# Patient Record
Sex: Female | Born: 1940
Health system: Southern US, Community
[De-identification: ages and names within clinical notes are randomized; demographics above are authoritative.]

## PROBLEM LIST (undated history)

## (undated) DIAGNOSIS — T7840XA Allergy, unspecified, initial encounter: Secondary | ICD-10-CM

## (undated) DIAGNOSIS — M1990 Unspecified osteoarthritis, unspecified site: Secondary | ICD-10-CM

## (undated) DIAGNOSIS — I499 Cardiac arrhythmia, unspecified: Secondary | ICD-10-CM

## (undated) DIAGNOSIS — H532 Diplopia: Secondary | ICD-10-CM

## (undated) DIAGNOSIS — N200 Calculus of kidney: Secondary | ICD-10-CM

## (undated) DIAGNOSIS — H269 Unspecified cataract: Secondary | ICD-10-CM

## (undated) DIAGNOSIS — I10 Essential (primary) hypertension: Secondary | ICD-10-CM

## (undated) DIAGNOSIS — N390 Urinary tract infection, site not specified: Secondary | ICD-10-CM

## (undated) DIAGNOSIS — R7989 Other specified abnormal findings of blood chemistry: Secondary | ICD-10-CM

## (undated) DIAGNOSIS — Z87442 Personal history of urinary calculi: Secondary | ICD-10-CM

## (undated) DIAGNOSIS — I4891 Unspecified atrial fibrillation: Secondary | ICD-10-CM

## (undated) DIAGNOSIS — E039 Hypothyroidism, unspecified: Secondary | ICD-10-CM

## (undated) DIAGNOSIS — E785 Hyperlipidemia, unspecified: Secondary | ICD-10-CM

## (undated) DIAGNOSIS — D72819 Decreased white blood cell count, unspecified: Secondary | ICD-10-CM

## (undated) DIAGNOSIS — R945 Abnormal results of liver function studies: Secondary | ICD-10-CM

## (undated) HISTORY — DX: Unspecified osteoarthritis, unspecified site: M19.90

## (undated) HISTORY — DX: Calculus of kidney: N20.0

## (undated) HISTORY — DX: Essential (primary) hypertension: I10

## (undated) HISTORY — DX: Other specified abnormal findings of blood chemistry: R79.89

## (undated) HISTORY — DX: Urinary tract infection, site not specified: N39.0

## (undated) HISTORY — DX: Abnormal results of liver function studies: R94.5

## (undated) HISTORY — DX: Hypothyroidism, unspecified: E03.9

## (undated) HISTORY — DX: Unspecified cataract: H26.9

## (undated) HISTORY — DX: Hyperlipidemia, unspecified: E78.5

## (undated) HISTORY — PX: BONE MARROW BIOPSY: SHX199

## (undated) HISTORY — PX: TONSILLECTOMY: SHX5217

## (undated) HISTORY — PX: LIVER BIOPSY: SHX301

## (undated) HISTORY — DX: Decreased white blood cell count, unspecified: D72.819

## (undated) HISTORY — DX: Cardiac arrhythmia, unspecified: I49.9

## (undated) HISTORY — PX: OTHER SURGICAL HISTORY: SHX169

## (undated) HISTORY — DX: Diplopia: H53.2

## (undated) HISTORY — DX: Allergy, unspecified, initial encounter: T78.40XA

## (undated) HISTORY — PX: CATARACT EXTRACTION, BILATERAL: SHX1313

## (undated) HISTORY — PX: TUBAL LIGATION: SHX77

---

## 1998-07-10 ENCOUNTER — Other Ambulatory Visit: Admission: RE | Admit: 1998-07-10 | Discharge: 1998-07-10 | Payer: Self-pay | Admitting: Obstetrics and Gynecology

## 1999-08-06 ENCOUNTER — Other Ambulatory Visit: Admission: RE | Admit: 1999-08-06 | Discharge: 1999-08-06 | Payer: Self-pay | Admitting: Obstetrics and Gynecology

## 1999-12-28 ENCOUNTER — Encounter: Admission: RE | Admit: 1999-12-28 | Discharge: 1999-12-28 | Payer: Self-pay | Admitting: Obstetrics and Gynecology

## 1999-12-28 ENCOUNTER — Encounter: Payer: Self-pay | Admitting: Obstetrics and Gynecology

## 2000-07-29 ENCOUNTER — Encounter (INDEPENDENT_AMBULATORY_CARE_PROVIDER_SITE_OTHER): Payer: Self-pay | Admitting: Specialist

## 2000-07-29 ENCOUNTER — Other Ambulatory Visit: Admission: RE | Admit: 2000-07-29 | Discharge: 2000-07-29 | Payer: Self-pay | Admitting: Obstetrics and Gynecology

## 2000-08-11 ENCOUNTER — Other Ambulatory Visit: Admission: RE | Admit: 2000-08-11 | Discharge: 2000-08-11 | Payer: Self-pay | Admitting: Obstetrics and Gynecology

## 2000-09-30 DIAGNOSIS — H532 Diplopia: Secondary | ICD-10-CM

## 2000-09-30 HISTORY — DX: Diplopia: H53.2

## 2001-01-02 ENCOUNTER — Encounter: Payer: Self-pay | Admitting: Obstetrics and Gynecology

## 2001-01-02 ENCOUNTER — Encounter: Admission: RE | Admit: 2001-01-02 | Discharge: 2001-01-02 | Payer: Self-pay | Admitting: Obstetrics and Gynecology

## 2001-02-09 ENCOUNTER — Other Ambulatory Visit: Admission: RE | Admit: 2001-02-09 | Discharge: 2001-02-09 | Payer: Self-pay | Admitting: Obstetrics and Gynecology

## 2001-09-11 ENCOUNTER — Other Ambulatory Visit: Admission: RE | Admit: 2001-09-11 | Discharge: 2001-09-11 | Payer: Self-pay | Admitting: Internal Medicine

## 2001-09-11 ENCOUNTER — Encounter: Payer: Self-pay | Admitting: Internal Medicine

## 2002-01-05 ENCOUNTER — Encounter: Payer: Self-pay | Admitting: Internal Medicine

## 2002-01-05 ENCOUNTER — Encounter: Admission: RE | Admit: 2002-01-05 | Discharge: 2002-01-05 | Payer: Self-pay | Admitting: Internal Medicine

## 2002-07-20 ENCOUNTER — Encounter (INDEPENDENT_AMBULATORY_CARE_PROVIDER_SITE_OTHER): Payer: Self-pay | Admitting: Specialist

## 2002-07-20 ENCOUNTER — Ambulatory Visit: Admission: RE | Admit: 2002-07-20 | Discharge: 2002-07-20 | Payer: Self-pay | Admitting: Oncology

## 2002-07-26 ENCOUNTER — Encounter: Payer: Self-pay | Admitting: Internal Medicine

## 2002-10-29 ENCOUNTER — Encounter: Payer: Self-pay | Admitting: Internal Medicine

## 2002-11-08 ENCOUNTER — Other Ambulatory Visit: Admission: RE | Admit: 2002-11-08 | Discharge: 2002-11-08 | Payer: Self-pay | Admitting: Internal Medicine

## 2003-01-11 ENCOUNTER — Encounter: Payer: Self-pay | Admitting: Internal Medicine

## 2003-01-11 ENCOUNTER — Encounter: Admission: RE | Admit: 2003-01-11 | Discharge: 2003-01-11 | Payer: Self-pay | Admitting: Internal Medicine

## 2003-10-01 HISTORY — PX: OTHER SURGICAL HISTORY: SHX169

## 2004-01-13 ENCOUNTER — Encounter: Admission: RE | Admit: 2004-01-13 | Discharge: 2004-01-13 | Payer: Self-pay | Admitting: Internal Medicine

## 2004-01-18 ENCOUNTER — Encounter: Admission: RE | Admit: 2004-01-18 | Discharge: 2004-01-18 | Payer: Self-pay | Admitting: Internal Medicine

## 2005-01-25 ENCOUNTER — Encounter: Admission: RE | Admit: 2005-01-25 | Discharge: 2005-01-25 | Payer: Self-pay | Admitting: Internal Medicine

## 2005-05-13 ENCOUNTER — Ambulatory Visit: Payer: Self-pay | Admitting: Internal Medicine

## 2005-05-20 ENCOUNTER — Ambulatory Visit: Payer: Self-pay | Admitting: Internal Medicine

## 2005-05-20 ENCOUNTER — Other Ambulatory Visit: Admission: RE | Admit: 2005-05-20 | Discharge: 2005-05-20 | Payer: Self-pay | Admitting: Internal Medicine

## 2005-06-17 ENCOUNTER — Ambulatory Visit: Payer: Self-pay | Admitting: Internal Medicine

## 2005-06-25 ENCOUNTER — Encounter: Payer: Self-pay | Admitting: Internal Medicine

## 2005-08-06 ENCOUNTER — Ambulatory Visit: Payer: Self-pay | Admitting: Internal Medicine

## 2005-11-06 ENCOUNTER — Ambulatory Visit: Payer: Self-pay | Admitting: Internal Medicine

## 2006-01-27 ENCOUNTER — Encounter: Admission: RE | Admit: 2006-01-27 | Discharge: 2006-01-27 | Payer: Self-pay | Admitting: Internal Medicine

## 2006-06-23 ENCOUNTER — Ambulatory Visit: Payer: Self-pay | Admitting: Internal Medicine

## 2006-06-30 ENCOUNTER — Ambulatory Visit: Payer: Self-pay | Admitting: Internal Medicine

## 2006-09-30 LAB — CONVERTED CEMR LAB: Pap Smear: NORMAL

## 2007-01-30 ENCOUNTER — Encounter: Admission: RE | Admit: 2007-01-30 | Discharge: 2007-01-30 | Payer: Self-pay | Admitting: Internal Medicine

## 2007-06-16 DIAGNOSIS — E039 Hypothyroidism, unspecified: Secondary | ICD-10-CM | POA: Insufficient documentation

## 2007-06-16 DIAGNOSIS — E785 Hyperlipidemia, unspecified: Secondary | ICD-10-CM

## 2007-07-14 ENCOUNTER — Ambulatory Visit: Payer: Self-pay | Admitting: Internal Medicine

## 2007-07-14 ENCOUNTER — Encounter: Payer: Self-pay | Admitting: Internal Medicine

## 2007-07-31 ENCOUNTER — Ambulatory Visit: Payer: Self-pay | Admitting: Internal Medicine

## 2007-07-31 LAB — CONVERTED CEMR LAB
Glucose, Urine, Semiquant: 100
Nitrite: NEGATIVE
Protein, U semiquant: NEGATIVE
Urobilinogen, UA: 0.2

## 2007-08-05 LAB — CONVERTED CEMR LAB
ALT: 36 units/L — ABNORMAL HIGH (ref 0–35)
AST: 33 units/L (ref 0–37)
Alkaline Phosphatase: 78 units/L (ref 39–117)
BUN: 16 mg/dL (ref 6–23)
Basophils Relative: 1.6 % — ABNORMAL HIGH (ref 0.0–1.0)
Bilirubin, Direct: 0.1 mg/dL (ref 0.0–0.3)
Chloride: 107 meq/L (ref 96–112)
Creatinine, Ser: 0.6 mg/dL (ref 0.4–1.2)
Direct LDL: 154.3 mg/dL
Eosinophils Absolute: 0.1 10*3/uL (ref 0.0–0.6)
Eosinophils Relative: 3.1 % (ref 0.0–5.0)
HCT: 41.3 % (ref 36.0–46.0)
Hemoglobin: 14.4 g/dL (ref 12.0–15.0)
MCV: 94.9 fL (ref 78.0–100.0)
Potassium: 4.9 meq/L (ref 3.5–5.1)
RBC: 4.36 M/uL (ref 3.87–5.11)
Sodium: 142 meq/L (ref 135–145)
Total Bilirubin: 0.8 mg/dL (ref 0.3–1.2)
Triglycerides: 53 mg/dL (ref 0–149)
VLDL: 11 mg/dL (ref 0–40)

## 2007-08-10 ENCOUNTER — Encounter: Payer: Self-pay | Admitting: Internal Medicine

## 2007-08-10 ENCOUNTER — Other Ambulatory Visit: Admission: RE | Admit: 2007-08-10 | Discharge: 2007-08-10 | Payer: Self-pay | Admitting: Internal Medicine

## 2007-08-10 ENCOUNTER — Ambulatory Visit: Payer: Self-pay | Admitting: Internal Medicine

## 2007-08-10 DIAGNOSIS — M899 Disorder of bone, unspecified: Secondary | ICD-10-CM

## 2007-08-10 DIAGNOSIS — D709 Neutropenia, unspecified: Secondary | ICD-10-CM

## 2007-08-10 DIAGNOSIS — M949 Disorder of cartilage, unspecified: Secondary | ICD-10-CM

## 2007-08-11 ENCOUNTER — Encounter: Payer: Self-pay | Admitting: Internal Medicine

## 2007-09-08 ENCOUNTER — Ambulatory Visit: Payer: Self-pay | Admitting: Internal Medicine

## 2007-09-09 ENCOUNTER — Encounter: Payer: Self-pay | Admitting: Internal Medicine

## 2007-09-10 ENCOUNTER — Encounter: Payer: Self-pay | Admitting: Internal Medicine

## 2007-09-10 LAB — CONVERTED CEMR LAB
CRP, High Sensitivity: 1 — ABNORMAL HIGH
Total Bilirubin: 1.1 mg/dL (ref 0.3–1.2)

## 2007-09-15 ENCOUNTER — Ambulatory Visit: Payer: Self-pay | Admitting: Internal Medicine

## 2007-09-15 DIAGNOSIS — J069 Acute upper respiratory infection, unspecified: Secondary | ICD-10-CM | POA: Insufficient documentation

## 2007-12-15 ENCOUNTER — Ambulatory Visit: Payer: Self-pay | Admitting: Internal Medicine

## 2007-12-28 ENCOUNTER — Telehealth: Payer: Self-pay | Admitting: *Deleted

## 2007-12-29 LAB — CONVERTED CEMR LAB
CRP, High Sensitivity: <1 — ABNORMAL LOW (ref 0.00–5.00)
TSH: 1.67 microintl units/mL (ref 0.35–5.50)

## 2008-02-04 ENCOUNTER — Encounter: Admission: RE | Admit: 2008-02-04 | Discharge: 2008-02-04 | Payer: Self-pay | Admitting: Internal Medicine

## 2008-07-07 ENCOUNTER — Ambulatory Visit: Payer: Self-pay | Admitting: Internal Medicine

## 2008-07-19 ENCOUNTER — Ambulatory Visit: Payer: Self-pay | Admitting: Internal Medicine

## 2008-10-19 ENCOUNTER — Ambulatory Visit: Payer: Self-pay | Admitting: Internal Medicine

## 2008-10-19 LAB — CONVERTED CEMR LAB
AST: 61 units/L — ABNORMAL HIGH (ref 0–37)
Albumin: 3.8 g/dL (ref 3.5–5.2)
Alkaline Phosphatase: 70 units/L (ref 39–117)
BUN: 16 mg/dL (ref 6–23)
Basophils Absolute: 0 10*3/uL (ref 0.0–0.1)
Bilirubin Urine: NEGATIVE
CO2: 31 meq/L (ref 19–32)
Cholesterol: 271 mg/dL (ref 0–200)
Direct LDL: 127 mg/dL
Eosinophils Relative: 2.2 % (ref 0.0–5.0)
GFR calc Af Amer: 108 mL/min
GFR calc non Af Amer: 89 mL/min
Glucose, Urine, Semiquant: NEGATIVE
HDL: 123.5 mg/dL (ref >39.0)
Hemoglobin: 14 g/dL (ref 12.0–15.0)
Ketones, urine, test strip: NEGATIVE
Lymphocytes Relative: 22.1 % (ref 12.0–46.0)
Monocytes Relative: 14.3 % — ABNORMAL HIGH (ref 3.0–12.0)
Sodium: 143 meq/L (ref 135–145)
TSH: 1.32 microintl units/mL (ref 0.35–5.50)
Total Bilirubin: 0.9 mg/dL (ref 0.3–1.2)
Total CHOL/HDL Ratio: 2.2
Triglycerides: 46 mg/dL (ref 0–149)
Urobilinogen, UA: 0.2
VLDL: 9 mg/dL (ref 0–40)
WBC: 3 10*3/uL — ABNORMAL LOW (ref 4.5–10.5)
pH: 5.5

## 2008-10-31 ENCOUNTER — Ambulatory Visit: Payer: Self-pay | Admitting: Internal Medicine

## 2008-10-31 DIAGNOSIS — R945 Abnormal results of liver function studies: Secondary | ICD-10-CM | POA: Insufficient documentation

## 2008-11-28 ENCOUNTER — Ambulatory Visit: Payer: Self-pay | Admitting: Internal Medicine

## 2008-11-28 LAB — CONVERTED CEMR LAB
ALT: 40 units/L — ABNORMAL HIGH (ref 0–35)
Alkaline Phosphatase: 71 units/L (ref 39–117)
Bilirubin, Direct: 0.1 mg/dL (ref 0.0–0.3)

## 2008-12-05 ENCOUNTER — Ambulatory Visit: Payer: Self-pay | Admitting: Internal Medicine

## 2008-12-05 DIAGNOSIS — R079 Chest pain, unspecified: Secondary | ICD-10-CM

## 2008-12-21 ENCOUNTER — Encounter: Payer: Self-pay | Admitting: Internal Medicine

## 2009-02-07 ENCOUNTER — Encounter: Admission: RE | Admit: 2009-02-07 | Discharge: 2009-02-07 | Payer: Self-pay | Admitting: Internal Medicine

## 2009-05-12 ENCOUNTER — Telehealth: Payer: Self-pay | Admitting: *Deleted

## 2009-06-06 ENCOUNTER — Ambulatory Visit: Payer: Self-pay | Admitting: Internal Medicine

## 2009-06-14 LAB — CONVERTED CEMR LAB
ALT: 36 units/L — ABNORMAL HIGH (ref 0–35)
Albumin: 3.8 g/dL (ref 3.5–5.2)
Bilirubin, Direct: 0 mg/dL (ref 0.0–0.3)
Total Bilirubin: 0.8 mg/dL (ref 0.3–1.2)

## 2009-09-07 ENCOUNTER — Ambulatory Visit: Payer: Self-pay | Admitting: Internal Medicine

## 2009-09-07 LAB — CONVERTED CEMR LAB
ALT: 49 units/L — ABNORMAL HIGH (ref 0–35)
Alkaline Phosphatase: 79 units/L (ref 39–117)
Total Bilirubin: 1.1 mg/dL (ref 0.3–1.2)
Total Protein: 7.2 g/dL (ref 6.0–8.3)

## 2009-10-03 ENCOUNTER — Ambulatory Visit: Payer: Self-pay | Admitting: Internal Medicine

## 2009-10-03 DIAGNOSIS — M19049 Primary osteoarthritis, unspecified hand: Secondary | ICD-10-CM | POA: Insufficient documentation

## 2009-10-12 ENCOUNTER — Encounter: Payer: Self-pay | Admitting: Internal Medicine

## 2009-10-31 ENCOUNTER — Encounter: Admission: RE | Admit: 2009-10-31 | Discharge: 2009-10-31 | Payer: Self-pay | Admitting: Rheumatology

## 2009-11-10 ENCOUNTER — Encounter: Payer: Self-pay | Admitting: Internal Medicine

## 2009-11-10 ENCOUNTER — Ambulatory Visit (HOSPITAL_COMMUNITY): Admission: RE | Admit: 2009-11-10 | Discharge: 2009-11-10 | Payer: Self-pay | Admitting: Unknown Physician Specialty

## 2009-11-15 ENCOUNTER — Encounter: Payer: Self-pay | Admitting: Internal Medicine

## 2009-12-26 ENCOUNTER — Telehealth: Payer: Self-pay | Admitting: *Deleted

## 2009-12-28 ENCOUNTER — Ambulatory Visit: Payer: Self-pay | Admitting: Internal Medicine

## 2010-02-01 ENCOUNTER — Ambulatory Visit: Payer: Self-pay | Admitting: Internal Medicine

## 2010-02-19 ENCOUNTER — Encounter: Admission: RE | Admit: 2010-02-19 | Discharge: 2010-02-19 | Payer: Self-pay | Admitting: Internal Medicine

## 2010-02-20 ENCOUNTER — Other Ambulatory Visit: Admission: RE | Admit: 2010-02-20 | Discharge: 2010-02-20 | Payer: Self-pay | Admitting: Internal Medicine

## 2010-02-21 ENCOUNTER — Ambulatory Visit: Payer: Self-pay | Admitting: Internal Medicine

## 2010-02-27 ENCOUNTER — Encounter: Payer: Self-pay | Admitting: *Deleted

## 2010-02-28 LAB — CONVERTED CEMR LAB
AST: 35 units/L (ref 0–37)
Alkaline Phosphatase: 91 units/L (ref 39–117)
BUN: 18 mg/dL (ref 6–23)
Bilirubin, Direct: 0.1 mg/dL (ref 0.0–0.3)
CO2: 32 meq/L (ref 19–32)
Calcium: 9.9 mg/dL (ref 8.4–10.5)
Creatinine, Ser: 0.6 mg/dL (ref 0.4–1.2)
Direct LDL: 165.6 mg/dL
Eosinophils Relative: 3.3 % (ref 0.0–5.0)
Glucose, Bld: 77 mg/dL (ref 70–99)
HCT: 41.3 % (ref 36.0–46.0)
Lymphocytes Relative: 32.2 % (ref 12.0–46.0)
Lymphs Abs: 1 10*3/uL (ref 0.7–4.0)
MCHC: 34.7 g/dL (ref 30.0–36.0)
MCV: 95.4 fL (ref 78.0–100.0)
Monocytes Absolute: 0.3 10*3/uL (ref 0.1–1.0)
Monocytes Relative: 11 % (ref 3.0–12.0)
Neutrophils Relative %: 52.7 % (ref 43.0–77.0)
Pap Smear: NEGATIVE
Potassium: 4.9 meq/L (ref 3.5–5.1)
Sodium: 144 meq/L (ref 135–145)
TSH: 0.69 microintl units/mL (ref 0.35–5.50)
Total Bilirubin: 0.9 mg/dL (ref 0.3–1.2)
Triglycerides: 37 mg/dL (ref 0.0–149.0)
VLDL: 7.4 mg/dL (ref 0.0–40.0)

## 2010-04-10 ENCOUNTER — Encounter: Payer: Self-pay | Admitting: Internal Medicine

## 2010-06-19 ENCOUNTER — Ambulatory Visit: Payer: Self-pay | Admitting: Internal Medicine

## 2010-06-19 LAB — CONVERTED CEMR LAB
AST: 35 units/L (ref 0–37)
Basophils Absolute: 0 10*3/uL (ref 0.0–0.1)
Eosinophils Relative: 1.4 % (ref 0.0–5.0)
Hemoglobin: 14 g/dL (ref 12.0–15.0)
Lymphocytes Relative: 19.7 % (ref 12.0–46.0)
MCV: 95.9 fL (ref 78.0–100.0)
Monocytes Relative: 8.2 % (ref 3.0–12.0)
Neutro Abs: 3.8 10*3/uL (ref 1.4–7.7)
Neutrophils Relative %: 70.2 % (ref 43.0–77.0)
RDW: 14.5 % (ref 11.5–14.6)
Total Bilirubin: 0.8 mg/dL (ref 0.3–1.2)

## 2010-06-25 ENCOUNTER — Telehealth: Payer: Self-pay | Admitting: *Deleted

## 2010-10-30 NOTE — Letter (Signed)
Summary: [facility]   Imported By: Maryln Gottron 04/19/2010 13:22:35  _____________________________________________________________________  External Attachment:    Type:   Image     Comment:   External Document

## 2010-10-30 NOTE — Assessment & Plan Note (Signed)
Summary: twinrx vaccine/ssc   Nurse Visit   Allergies: 1)  ! Pcn  Immunizations Administered:  TwinRix # 1:    Vaccine Type: TwinRix    Site: left deltoid    Mfr: GlaxoSmithKline    Dose: 1.0 ml    Route: IM    Given by: Romualdo Bolk, CMA (AAMA)    Exp. Date: 12/09/2011    Lot #: [id number]  Orders Added: 1)  TwinRix 1ml ( Hep A&B Adult dose) [90636] 2)  Admin 1st Vaccine [90471]

## 2010-10-30 NOTE — Assessment & Plan Note (Signed)
Summary: follow up on labs/ssc   Vital Signs:  Patient profile:   71 year old female Menstrual status:  postmenopausal Height:      65.25 inches Weight:      144 pounds BMI:     23.87 Pulse rate:   66 / minute BP sitting:   110 / 60  (right arm) Cuff size:   regular  Vitals Entered By: Romualdo Bolk, CMA (AAMA) (October 03, 2009 2:11 PM) CC: Follow-up visit on labs Menarche (age onset years): 15    Menstrual Status postmenopausal   History of Present Illness: Crystal Odom comes  in today with husband for follow up of her abnormal lab test.  She feels quite well  . has had some le  joint pain felt related to exercise and  ? oa  getting older but is physically active. She does have some stiffness and what she calls  osteoarthritis signs in her hands  but no unusual rashes weight loss fever Gi or Gu signs.     She related  strong family hx of rhematic disease incluing autoimmune hepatitis scleroderma and ra and thyroid disease.     She has not had any of these dx. She was evaluated in the past for leukopenia that dipped to the 2.9 range  that included a bone marrow  .  Cause that was felt to be idopathic by hematology . Has done well since then and no unususal infection of didsease.  Bone density to review.  no fractures .     Preventive Screening-Counseling & Management  Alcohol-Tobacco     Alcohol drinks/day: 1     Alcohol type: wine     Smoking Status: never  Caffeine-Diet-Exercise     Caffeine use/day: 3     Does Patient Exercise: yes     Type of exercise: walking, cardio and strength and yoga     Times/week: 7  Current Medications (verified): 1)  Calcium 500/d 500-125 Mg-Unit  Tabs (Calcium Carbonate-Vitamin D) .Marland Kitchen.. 1 By Mouth Two Times A Day 2)  Centrum Silver   Tabs (Multiple Vitamins-Minerals) .... 3 Tabs A Week 3)  Levoxyl 88 Mcg  Tabs (Levothyroxine Sodium) .Marland Kitchen.. 1 By Mouth Once Daily 4)  Fish Oil   Oil (Fish Oil) .... Once Daily 5)  Aspirin 81 Mg   Tabs (Aspirin) .... 3 A Week  Allergies (verified): 1)  ! Pcn  Past History:  Past medical, surgical, family and social histories (including risk factors) reviewed, and no changes noted (except as noted below).  Past Medical History: Kidney Stones UTI Hyperlipidemia Hypothyroidism Left knee arthritis  ortho  Leukopenia   with neg hemevaluation   CONSULTANTS  [facility]     Past Surgical History: Reviewed history from 10/31/2008 and no changes required. Breast Bx Kidney Stone Removal Tonsillectomy left arthroscopic  2005     Past History:  Care Management: Orthopedics: Thurston [facility] Dermatology: Mayford Knife  Family History: Reviewed history from 10/31/2008 and no changes required. Family History of Arthritis  Maternal GM  ?RA   Family History High cholesterol Family History Hypertension Family History Osteoporosis Family History of Prostate CA 1st degree relative <50 Family History of Stroke M 1st degree relative <50 Family History Thyroid disease Family History of Cardiovascular disorder  Father: died at 49 HT  SCCA in ear and major.  renal failure  Mother:  died at 65 Heart disease   OA  Siblings:   bro and sister ?   Sclerodema raynauds  ?  autoimmune   joint  problems   Social History: Reviewed history from 12/05/2008 and no changes required. Occupation: retired 2008 sept  Married Never Smoked  Alcohol use-yes  5/7 nigths  1 glass wine  Drug use-no Regular exercise-yes HH of 2   no pets   Caffeine use/day:  3  Review of Systems  The patient denies anorexia, fever, weight loss, weight gain, vision loss, decreased hearing, hoarseness, chest pain, syncope, dyspnea on exertion, peripheral edema, prolonged cough, headaches, hemoptysis, abdominal pain, melena, hematochezia, severe indigestion/heartburn, hematuria, incontinence, muscle weakness, suspicious skin lesions, transient blindness, difficulty walking, depression, unusual weight change, abnormal  bleeding, enlarged lymph nodes, angioedema, and breast masses.    Physical Exam  General:  Well-developed,well-nourished,in no acute distress; alert,appropriate and cooperative throughout examination Head:  normocephalic and atraumatic.   Eyes:  vision grossly intact, pupils equal, and pupils round.   Ears:  R ear normal, L ear normal, and no external deformities.   Neck:  No deformities, masses, or tenderness noted. Lungs:  Normal respiratory effort, chest expands symmetrically. Lungs are clear to auscultation, no crackles or wheezes.no dullness.   Heart:  Normal rate and regular rhythm. S1 and S2 normal without gallop, murmur, click, rub or other extra sounds. Abdomen:  Bowel sounds positive,abdomen soft and non-tender without masses, organomegaly or   noted. Msk:  no joint tenderness, no joint warmth, and no redness over joints.  mild mcp pip changes hands no acute joint swelling Pulses:  nl cap refill Extremities:  no clubbing cyanosis or edema  Neurologic:  alert & oriented X3, strength normal in all extremities, and gait normal.   Skin:  turgor normal, color normal, no ecchymoses, no petechiae, and no purpura.   Cervical Nodes:  No lymphadenopathy noted Psych:  Oriented X3, normally interactive, good eye contact, not anxious appearing, and not depressed appearing.   Additional Exam:  see labs    Impression & Recommendations:  Problem # 1:  LIVER FUNCTION TESTS, ABNORMAL (ICD-794.8)  low grade  waxing and waning over prolonged time with hx of nl [place]   but pos ana  currently . disc option of gi consult but will have her see rheum first.    Orders: Rheumatology Referral (Rheumatology)  Problem # 2:  UNSPECIFIED ARTHOPATHY, HAND (ICD-716.94)  felt to be  degenerative and certainly not severe but  with above   and fam hx of rhem disease  and hx of leukopenia unspec.  will get rheum consults   Orders: Rheumatology Referral (Rheumatology)  Problem # 3:  NEUTROPENIA UNSPECIFIED  (ICD-288.00) No symptom  chronic  not changing  prev eval by heme.  Orders: Rheumatology Referral (Rheumatology)  Problem # 4:  OSTEOPENIA (ICD-733.90) frax score    lower risk  will follow and doe weight bearing exercise  ca vit d etc and repeat in 2 years .  Her updated medication list for this problem includes:    Calcium 500/d 500-125 Mg-unit Tabs (Calcium carbonate-vitamin d) .Marland Kitchen... 1 by mouth two times a day  Problem # 5:  family history of autoimmune disease  Complete Medication List: 1)  Calcium 500/d 500-125 Mg-unit Tabs (Calcium carbonate-vitamin d) .Marland Kitchen.. 1 by mouth two times a day 2)  Centrum Silver Tabs (Multiple vitamins-minerals) .... 3 tabs a week 3)  Levoxyl 88 Mcg Tabs (Levothyroxine sodium) .Marland Kitchen.. 1 by mouth once daily 4)  Fish Oil Oil (Fish oil) .... Once daily 5)  Aspirin 81 Mg Tabs (Aspirin) .... 3 a week  Patient Instructions:  1)  Someone will contact  you about the rheumatology referral.  2)  then  will plan   preventive yearly visit after that .  3)  your frax score is  1.7 % risk of hip fracture and  10 % for overall osteoporotic fracture  for the next 10 years.  4)  weight bearing exercise and adequate calcium and vitamin D recomended. will review paper record and get heme consult scanned into record.

## 2010-10-30 NOTE — Assessment & Plan Note (Signed)
Summary: twinrx/ssc   Nurse Visit   Allergies: 1)  ! Pcn  Immunizations Administered:  TwinRix # 2:    Vaccine Type: TwinRix    Site: left deltoid    Mfr: GlaxoSmithKline    Dose: 1.0 ml    Route: IM    Given by: Romualdo Bolk, CMA (AAMA)    Exp. Date: 12/09/2011    Lot #: [id number]  Orders Added: 1)  TwinRix 1ml ( Hep A&B Adult dose) [90636] 2)  Admin 1st Vaccine [90471]

## 2010-10-30 NOTE — Letter (Signed)
[facility]   Imported By: Lennie Odor 03/22/2010 10:16:48  _____________________________________________________________________  External Attachment:    Type:   Image     Comment:   External Document

## 2010-10-30 NOTE — Assessment & Plan Note (Signed)
Summary: emp--will fast//ccm   Vital Signs:  Patient profile:   70 year old female Menstrual status:  postmenopausal Height:      65.5 inches Weight:      141 pounds Pulse rate:   60 / minute BP sitting:   120 / 80  (right arm)  Vitals Entered By: Romualdo Bolk, CMA (AAMA) (Feb 21, 2010 10:06 AM) CC: Annual Visit for Disease Management, Discuss doing a pap, Pt is fasting.- Pt needs a rx for Vivotif Berna and Malarone 250mg . Pt is going to [place]. She is going to need 17 tabs of the Malarone.   History of Present Illness: Crystal Odom comes in for yearly visit . Since last visit she has seen Dr Dareen Piano adn under evaluatino for rheumatic disease  poss autoimmune liver disease .   Had elevated smooth muscle antibody  and thus and a liver bx that apparently  was normal.  Now following to see Dr Dareen Piano in .July .  Actually feels pretty well and no infections or new joint problems bleeding .  THyroid taking meds  witour ptrblem ,  Last pap  3 years ago  and normal . No gyne signs   To travel on a mission   trip with group to [place] and needs antimalarial prophylaxis. .  Preventive Care Screening  Pap Smear:    Date:  09/30/2006    Results:  normal   Prior Values:    Mammogram:  ASSESSMENT: Negative - BI-RADS 1^MM DIGITAL SCREENING (02/19/2010)    Last Tetanus Booster:  Historical (10/31/2002)    Last Flu Shot:  Fluvax 3+ (07/07/2008)    Last Pneumovax:  Pneumovax (07/19/2008)   Preventive Screening-Counseling & Management  Alcohol-Tobacco     Alcohol drinks/day: 1     Alcohol type: wine     Smoking Status: never  Caffeine-Diet-Exercise     Caffeine use/day: 3     Does Patient Exercise: yes     Type of exercise: walking, cardio and strength and yoga     Times/week: 7  Hep-HIV-STD-Contraception     Dental Visit-last 6 months yes     Sun Exposure-Excessive: no  Safety-Violence-Falls     Seat Belt Use: yes     Firearms in the Home: no firearms in the  home     Smoke Detectors: yes  EKG  Procedure date:  10/31/2008  Findings:      No Acute Changes- SR NS St Changes  Current Medications (verified): 1)  Calcium 500/d 500-125 Mg-Unit  Tabs (Calcium Carbonate-Vitamin D) .Marland Kitchen.. 1 By Mouth Two Times A Day 2)  Levoxyl 88 Mcg  Tabs (Levothyroxine Sodium) .Marland Kitchen.. 1 By Mouth Once Daily 3)  Fish Oil   Oil (Fish Oil) .... Once Daily 4)  Aspirin 81 Mg  Tabs (Aspirin) .... 3 A Week  Allergies (verified): 1)  ! Pcn  Past History:  Past medical, surgical, family and social histories (including risk factors) reviewed, and no changes noted (except as noted below).  Past Medical History: G2P2 Kidney Stones UTI Hyperlipidemia Hypothyroidism Left knee arthritis  ortho  Leukopenia   with neg hemevaluation  BM BX   get wbc diff q 6 months  Abnormal lfts:  nl liver bx positive  antismooth muscle antibodies and neutropenia  coloncscopy 2004 [facility]  Hx of diplopia  and eval 2002 neg.  CONSULTANTS  [facility]     Past Surgical History: Reviewed history from 10/31/2008 and no changes required. Breast Bx Kidney Stone Removal  Tonsillectomy left arthroscopic  2005     Past History:  Care Management: Orthopedics: Thurston Hole Dermatology: Mayford Knife Cardiology: Katrinka Blazing Rheumatology: Dareen Piano Hem in past  RUbin  Family History: Reviewed history from 10/03/2009 and no changes required. Family History of Arthritis  Maternal GM  ?RA   Family History High cholesterol Family History Hypertension Family History Osteoporosis Family History of Prostate CA 1st degree relative <50 Family History of Stroke M 1st degree relative <50 Family History Thyroid disease Family History of Cardiovascular disorder  Father: died at 75 HT  SCCA in ear and major.  renal failure  Mother:  died at 80 Heart disease   OA  Siblings:   bro and sister ?   Sclerodema raynauds  ? autoimmune   joint  problems   Social History: Reviewed history from 12/05/2008 and no  changes required. Occupation: retired 2008 sept    Married Never Smoked  Alcohol use-yes  5/7 nigths  1 glass wine  Drug use-no Regular exercise-yes HH of 2   no pets   Seat Belt Use:  yes Dental Care w/in 6 mos.:  yes Sun Exposure-Excessive:  no  Review of Systems  The patient denies anorexia, fever, weight loss, weight gain, vision loss, decreased hearing, hoarseness, chest pain, syncope, dyspnea on exertion, peripheral edema, prolonged cough, abdominal pain, melena, hematochezia, severe indigestion/heartburn, hematuria, muscle weakness, unusual weight change, abnormal bleeding, enlarged lymph nodes, angioedema, and breast masses.         knee   cortisone shot tot knee per ortho  Physical Exam General Appearance: well developed, well nourished, no acute distress Eyes: conjunctiva and lids normal, PERRLA, EOMI, WNL contacts  Ears, Nose, Mouth, Throat: TM clear, nares clear, oral exam WNL Neck: supple, no lymphadenopathy, no thyromegaly, no JVD Respiratory: clear to auscultation and percussion, respiratory effort normal Cardiovascular: regular rate and rhythm, S1-S2, no murmur, rub or gallop, no bruits, peripheral pulses normal and symmetric, no cyanosis, clubbing, edema or varicosities Chest: no scars, masses, tenderness; no asymmetry, skin changes, nipple discharge   Gastrointestinal: soft, non-tender; no hepatosplenomegaly, masses; active bowel sounds all quadrants,l; no masses, tenderness, hemorrhoids  Genitourinary: no vaginal discharge, lesions; no masses or tenderness  pap done cx is clear Lymphatic: no cervical, axillary or inguinal adenopathy Musculoskeletal: gait normal, muscle tone and strength WNL, no joint swelling, effusions, discoloration, crepitus  Skin: clear, good turgor, color WNL, no rashes, lesions, or ulcerations Neurologic: normal mental status, normal reflexes, normal strength, sensation, and motion Psychiatric: alert; oriented to person, place and time Other  Exam:     Impression & Recommendations:  Problem # 1:  ROUTINE GYNECOLOGICAL EXAMINATION (ICD-V72.31)  nl exam today   Orders: Pelvic & Breast Exam ( Medicare)  (G0101) Obtaining Screening PAP Smear (E4540)  Problem # 2:  LIVER FUNCTION TESTS, ABNORMAL (ICD-794.8) poss autoimmune  following reported nl liver bx  Orders: TLB-Hepatic/Liver Function Pnl (80076-HEPATIC) TLB-BMP (Basic Metabolic Panel-BMET) (80048-METABOL) Venipuncture (98119)  Problem # 3:  NEUTROPENIA UNSPECIFIED (ICD-288.00)  Orders: TLB-CBC Platelet - w/Differential (85025-CBCD) Venipuncture (14782)  Problem # 4:  HYPOTHYROIDISM (ICD-244.9) Assessment: Unchanged  Her updated medication list for this problem includes:    Levoxyl 88 Mcg Tabs (Levothyroxine sodium) .Marland Kitchen... 1 by mouth once daily  Orders: TLB-TSH (Thyroid Stimulating Hormone) (84443-TSH) Venipuncture (95621)  Labs Reviewed: TSH: 1.32 (10/19/2008)    Chol: 271 (10/19/2008)   HDL: 123.5 (10/19/2008)   LDL: DEL (10/19/2008)   TG: 46 (10/19/2008)  Problem # 5:  HYPERLIPIDEMIA (ICD-272.4)  Orders: TLB-Lipid Panel (80061-LIPID)  Venipuncture [phone])  Labs Reviewed: SGOT: 39 (09/07/2009)   SGPT: 49 (09/07/2009)   HDL:123.5 (10/19/2008), 110.3 (07/31/2007)  LDL:DEL (10/19/2008), DEL (07/31/2007)  Chol:271 (10/19/2008), 280 (07/31/2007)  Trig:46 (10/19/2008), 53 (07/31/2007)  Problem # 6:  OSTEOPENIA (ICD-733.90)  Her updated medication list for this problem includes:    Calcium 500/d 500-125 Mg-unit Tabs (Calcium carbonate-vitamin d) .Marland Kitchen... 1 by mouth two times a day  Orders: TLB-BMP (Basic Metabolic Panel-BMET) (80048-METABOL)  Problem # 7:  NEED FOR UNSPECIFIED PROPHYLACTIC OR TX MEASURE (ICD-V07.9)  malaria prophylaxis   Orders: Prescription Created Electronically [phone])  Complete Medication List: 1)  Calcium 500/d 500-125 Mg-unit Tabs (Calcium carbonate-vitamin d) .Marland Kitchen.. 1 by mouth two times a day 2)  Levoxyl 88 Mcg Tabs  (Levothyroxine sodium) .Marland Kitchen.. 1 by mouth once daily 3)  Fish Oil Oil (Fish oil) .... Once daily 4)  Aspirin 81 Mg Tabs (Aspirin) .... 3 a week 5)  Vivotif Berna Vaccine Cpdr (typhoid Vaccine)- Dose Pack  .... Take 1 capsule by mouth on an empty stomach every day for 4 doses. complete at least 1 week before departure. must be refrigerated. 6)  Malarone 250-100 Mg Tabs (Atovaquone-proguanil hcl) .Marland Kitchen.. 1 by mouth once daily for malaria prevention, starting 1 day before departure. take daily while in malarious area and continue to take daily for 1 week after return  Patient Instructions: 1)  You will be informed of lab results when available.  2)  then plan follow up  Prescriptions: MALARONE 250-100 MG TABS (ATOVAQUONE-PROGUANIL HCL) 1 by mouth once daily for malaria prevention, starting 1 day before departure. Take daily while in malarious area and continue to take daily for 1 week after return  #17 x 0   Entered by:   Romualdo Bolk, CMA (AAMA)   Authorized by:   Madelin Headings MD   Signed by:   Romualdo Bolk, CMA (AAMA) on 02/21/2010   Method used:   Print then Give to Patient   RxID:   [id number] VIVOTIF BERNA VACCINE  CPDR (TYPHOID VACCINE)- DOSE PACK Take 1 capsule by mouth on an empty stomach every day for 4 doses. Complete at least 1 week before departure. MUST BE REFRIGERATED.  #1 PACK x 0   Entered by:   Romualdo Bolk, CMA (AAMA)   Authorized by:   Madelin Headings MD   Signed by:   Romualdo Bolk, CMA (AAMA) on 02/21/2010   Method used:   Print then Give to Patient   RxID:   [id number]    Preventive Care Screening  Pap Smear:    Date:  09/30/2006    Results:  normal   Prior Values:    Mammogram:  ASSESSMENT: Negative - BI-RADS 1^MM DIGITAL SCREENING (02/19/2010)    Last Tetanus Booster:  Historical (10/31/2002)    Last Flu Shot:  Fluvax 3+ (07/07/2008)    Last Pneumovax:  Pneumovax (07/19/2008)

## 2010-10-30 NOTE — Letter (Signed)
Summary: [facility]   Imported By: Maryln Gottron 11/27/2009 15:39:41  _____________________________________________________________________  External Attachment:    Type:   Image     Comment:   External Document

## 2010-10-30 NOTE — Letter (Signed)
Summary: Generic Letter  [facility]  [address] [place], [place] [address]   Phone: [phone]  Fax: [phone]    02/27/2010  Crystal Odom [address] [place], [place]  [address]  Dear Ms. Rathgeber, Your wbc is stable. Your liver test barely abnormal. Your cholestero is elevated and your thyroid just right. No change in medication. We need recheck your liver and cbcdiff (blood count)  in 4 months. Then we will do a follow up depending on results. If you have any questions, please give [place] a call at [phone].         Sincerely,   Tor Netters, CMA (AAMA)

## 2010-10-30 NOTE — Progress Notes (Signed)
Summary: ? about going to [place]  Phone Note Call from Patient Call back at [facility] Phone [phone]   Caller: Patient Summary of Call: Pt is getting ready for a Mission's trip. She is leaving on 6/26 for [place]. Pt needs Hep A or B vaccine. She also needs malaria tabs. But she is  concerned about the autoimmune hep disease. She had a CT and liver bx done by Dr. Ewell Poe office. Both were normal. She also needs thyphoid vaccine as well. Can she get all of this done. She is going to gone for 1 week. Initial call taken by: Romualdo Bolk, CMA Duncan Dull),  December 26, 2009 4:25 PM  Follow-up for Phone Call        Per Dr. Fabian Sharp- Go ahead do Hep A and B and typhoid vaccine. Follow-up by: Romualdo Bolk, CMA Duncan Dull),  December 26, 2009 4:46 PM  Additional Follow-up for Phone Call Additional follow up Details #1::        Pt aware and appt made for vaccines. Pt to call [facility] for typhoid vaccine. Additional Follow-up by: Romualdo Bolk, CMA (AAMA),  December 27, 2009 3:26 PM

## 2010-10-30 NOTE — Progress Notes (Signed)
Summary: lab results.  Phone Note Call from Patient Call back at [phone]   Caller: Patient Summary of Call: Pt wants to have a mailed copy of lab results, lab results sent to Dr. Dareen Piano and call her as well. Initial call taken by: Romualdo Bolk, CMA Duncan Dull),  June 25, 2010 4:39 PM  Follow-up for Phone Call        ok to sent   copy to patient  wbc are good this time . plan yearly visit   wellness visit  in  when she is due next year    MAY Follow-up by: Madelin Headings MD,  June 25, 2010 4:49 PM  Additional Follow-up for Phone Call Additional follow up Details #1::        Pt aware of results, copy mailed to pt and results faxed to Dr. Dareen Piano. Additional Follow-up by: Romualdo Bolk, CMA Duncan Dull),  June 25, 2010 4:55 PM

## 2010-10-30 NOTE — Letter (Signed)
[facility]   Imported By: Lennie Odor 03/22/2010 10:18:14  _____________________________________________________________________  External Attachment:    Type:   Image     Comment:   External Document

## 2010-10-30 NOTE — Letter (Signed)
Summary: [facility]   Imported By: Sherian Rein 10/23/2009 12:58:43  _____________________________________________________________________  External Attachment:    Type:   Image     Comment:   External Document  Appended Document: [facility] phone  call today from Dr Dareen Piano. AMA was very high .. will get a ct of liver and plan for liver biopsy.

## 2010-12-19 LAB — CBC
HCT: 41.4 % (ref 36.0–46.0)
Hemoglobin: 14.4 g/dL (ref 12.0–15.0)
MCV: 94.9 fL (ref 78.0–100.0)
Platelets: 234 10*3/uL (ref 150–400)
RBC: 4.36 MIL/uL (ref 3.87–5.11)
RDW: 13.9 % (ref 11.5–15.5)

## 2010-12-19 LAB — PROTIME-INR
INR: 0.91 (ref 0.00–1.49)
Prothrombin Time: 12.2 seconds (ref 11.6–15.2)

## 2011-01-05 ENCOUNTER — Other Ambulatory Visit: Payer: Self-pay | Admitting: Internal Medicine

## 2011-01-29 ENCOUNTER — Other Ambulatory Visit: Payer: Self-pay | Admitting: Internal Medicine

## 2011-01-29 DIAGNOSIS — Z1231 Encounter for screening mammogram for malignant neoplasm of breast: Secondary | ICD-10-CM

## 2011-01-31 ENCOUNTER — Encounter: Payer: Self-pay | Admitting: Internal Medicine

## 2011-02-04 ENCOUNTER — Other Ambulatory Visit: Payer: Self-pay | Admitting: Internal Medicine

## 2011-02-05 ENCOUNTER — Encounter: Payer: Self-pay | Admitting: Internal Medicine

## 2011-02-05 ENCOUNTER — Ambulatory Visit (INDEPENDENT_AMBULATORY_CARE_PROVIDER_SITE_OTHER): Payer: Medicare Other | Admitting: Internal Medicine

## 2011-02-05 VITALS — BP 110/60 | HR 60 | Wt 144.0 lb

## 2011-02-05 DIAGNOSIS — E785 Hyperlipidemia, unspecified: Secondary | ICD-10-CM

## 2011-02-05 DIAGNOSIS — Z8489 Family history of other specified conditions: Secondary | ICD-10-CM

## 2011-02-05 DIAGNOSIS — Z832 Family history of diseases of the blood and blood-forming organs and certain disorders involving the immune mechanism: Secondary | ICD-10-CM

## 2011-02-05 DIAGNOSIS — R945 Abnormal results of liver function studies: Secondary | ICD-10-CM

## 2011-02-05 DIAGNOSIS — D709 Neutropenia, unspecified: Secondary | ICD-10-CM

## 2011-02-05 DIAGNOSIS — E039 Hypothyroidism, unspecified: Secondary | ICD-10-CM

## 2011-02-05 LAB — LIPID PANEL
Cholesterol: 299 mg/dL — ABNORMAL HIGH (ref 0–200)
Triglycerides: 46 mg/dL (ref 0.0–149.0)
VLDL: 9.2 mg/dL (ref 0.0–40.0)

## 2011-02-05 LAB — CBC WITH DIFFERENTIAL/PLATELET
Basophils Absolute: 0 10*3/uL (ref 0.0–0.1)
Eosinophils Absolute: 0.2 10*3/uL (ref 0.0–0.7)
Lymphocytes Relative: 24.2 % (ref 12.0–46.0)
MCHC: 35 g/dL (ref 30.0–36.0)
Neutro Abs: 2.2 10*3/uL (ref 1.4–7.7)
Neutrophils Relative %: 58.5 % (ref 43.0–77.0)
Platelets: 259 10*3/uL (ref 150.0–400.0)
RDW: 14.5 % (ref 11.5–14.6)

## 2011-02-05 LAB — BASIC METABOLIC PANEL
BUN: 18 mg/dL (ref 6–23)
Chloride: 105 mEq/L (ref 96–112)
Creatinine, Ser: 0.6 mg/dL (ref 0.4–1.2)

## 2011-02-05 LAB — HEPATIC FUNCTION PANEL
ALT: 33 U/L (ref 0–35)
AST: 32 U/L (ref 0–37)
Alkaline Phosphatase: 77 U/L (ref 39–117)
Bilirubin, Direct: 0 mg/dL (ref 0.0–0.3)
Total Protein: 6.9 g/dL (ref 6.0–8.3)

## 2011-02-05 LAB — LDL CHOLESTEROL, DIRECT: Direct LDL: 164.8 mg/dL

## 2011-02-05 LAB — TSH: TSH: 1.1 u[IU]/mL (ref 0.35–5.50)

## 2011-02-05 NOTE — Patient Instructions (Signed)
Will notify you  of labs when available.  Continue lifestyle intervention healthy eating and exercise .  

## 2011-02-05 NOTE — Progress Notes (Signed)
Subjective:    Patient ID: Crystal Odom, female    DOB: Aug 23, 1941, 70 y.o.   MRN: [id number]  HPI Patient comes in for followup of a number of problems. She actually is feeling quite well since her last visit. However she is being followed for abnormal liver tests that are felt to be autoimmune cause leukopenia. She sees Dr. Dareen Piano about every 6 months. Next visit in July. Her last LFTs were slightly abnormal but improved..  ? If autoimmune hepatitis or lupus  she has no symptoms Feels fine  But blood test still off  At times  has shoulder and knee problem not related to a nonunion disease. Mammorgram  Pending.  Review of Systems Shoulder aggravation rotator cuff off and on. No bleeding swollen glands fever night sweats change in weight. No chest pain shortness of breath. To exercise and do yoga. No major changes in hearing or vision.   Past Medical History  Diagnosis Date  . Kidney stones   . UTI (lower urinary tract infection)   . Hyperlipidemia     HDL over 100  . Hypothyroidism   . Arthritis     left knee  . Leukopenia     with neg hemevaluation bm bx get wbc diff q 6 months  . 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 Diplopia 2002    eval neg   Past Surgical History  Procedure Date  . Breast biopsy   . Kidney stone removal   . Tonsillectomy   . Left arthroscopic 2005  . Bone marrow biopsy     reports that she has never smoked. She does not have any smokeless tobacco history on file. She reports that she drinks alcohol. She reports that she does not use illicit drugs. family history includes Autoimmune disease in her brother and sister; Heart disease in her mother; Osteoarthritis in her father; Osteoporosis in an unspecified family member; Other in her father; Stroke in an unspecified family member; and Thyroid disease in an unspecified family member. Allergies  Allergen Reactions  . Penicillins           Objective:   Physical Exam Physical Exam: Vital signs reviewed WUJ:WJXB is a well-developed well-nourished alert cooperative  white female who appears her stated age in no acute distress.  HEENT: normocephalic  traumatic , Eyes: PERRL EOM's full, conjunctiva clear, Nares: paten,t no deformity discharge or tenderness., Ears: no deformity EAC's clear TMs with normal landmarks. Mouth: clear OP, no lesions, edema.  Moist mucous membranes. Dentition in adequate repair. NECK: supple without masses, thyromegaly or bruits. Breast: normal by inspection . No dimpling, discharge, masses, tenderness or discharge .  CHEST/PULM:  Clear to auscultation and percussion breath sounds equal no wheeze , rales or rhonchi. No chest wall deformities or tenderness. CV: PMI is nondisplaced, S1 S2 no gallops, murmurs, rubs. Peripheral pulses are full without delay.No JVD .  ABDOMEN: Bowel sounds normal nontender  No guard or rebound, no hepato splenomegal no CVA tenderness.  No hernia. Extremtities:  No clubbing cyanosis or edema, no acute joint swelling or redness no focal atrophy NEURO:  Oriented x3, cranial nerves 3-12 appear to be intact, no obvious focal weakness,gait within normal limits no abnormal reflexes or asymmetrical SKIN: No acute rashes normal turgor, color, no bruising or petechiae. Angiomas pinpoint PSYCH: Oriented, good eye contact, no obvious depression anxiety, cognition and judgment appear normal.     Assessment & Plan:  Abn  lfts  With  Abnormal serologies  And neg liver bx  Following antibodies   LIPIDS mostly elevated hdl but will follow  Thyroid. No change in meds  Family  hx of autoimmune disease  Copy of labs to Dr Dareen Piano   Check in a year or labs earlier if  Appropriate    Copy of lab to patient

## 2011-02-06 ENCOUNTER — Encounter: Payer: Self-pay | Admitting: *Deleted

## 2011-02-06 ENCOUNTER — Encounter: Payer: Self-pay | Admitting: Internal Medicine

## 2011-02-06 DIAGNOSIS — Z832 Family history of diseases of the blood and blood-forming organs and certain disorders involving the immune mechanism: Secondary | ICD-10-CM | POA: Insufficient documentation

## 2011-02-10 ENCOUNTER — Other Ambulatory Visit: Payer: Self-pay | Admitting: Internal Medicine

## 2011-02-15 NOTE — Op Note (Signed)
   NAME:  Crystal Odom, PEREGOY NO.:  [id number]   MEDICAL RECORD NO.:  [id number]                   PATIENT TYPE:  OUT   LOCATION:  CARD                                 FACILITY:  [facility]   PHYSICIAN:  Pierce Crane, M.D.                   DATE OF BIRTH:  04-26-1941   DATE OF PROCEDURE:  DATE OF DISCHARGE:                                 OPERATIVE REPORT   PROCEDURE:  Bone marrow biopsy.   DESCRIPTION OF PROCEDURE:  The patient presented for bone marrow biopsy.  The patient was given 5 mg Versed, 25 mg IV Demerol.  She was placed in a  prone position.  Xylocaine 2% was used to anesthetize the area as well as;  she responds to 24 bupivacaine.  Aspirates were obtained from flow  cytometry, and general processing.  Bone marrow biopsy was also obtained.  The patient tolerated the procedure well and discharged from the outpatient  surgical center in stable condition.  She will follow up in the outpatient  clinic to discuss the results.                                                 Pierce Crane, M.D.    PR/MEDQ  D:  07/20/2002  T:  07/20/2002  Job:  [id number]

## 2011-02-15 NOTE — Consult Note (Signed)
   NAME:  Crystal Odom, Crystal Odom NO.:  [id number]   MEDICAL RECORD NO.:  [id number]                   PATIENT TYPE:  OUT   LOCATION:  CARD                                 FACILITY:  [facility]   PHYSICIAN:  Pierce Crane, M.D.                   DATE OF BIRTH:  1941/04/18   DATE OF CONSULTATION:  07/20/2002  DATE OF DISCHARGE:                                   CONSULTATION   10.21.3 moved report to Kaycee  Thank you  era                                               Pierce Crane, M.D.    PR/MEDQ  D:  07/20/2002  T:  07/20/2002  Job:  [id number]

## 2011-02-27 ENCOUNTER — Ambulatory Visit
Admission: RE | Admit: 2011-02-27 | Discharge: 2011-02-27 | Disposition: A | Discharge status: Home / Self Care | Payer: Medicare Other | Source: Ambulatory Visit | Attending: Internal Medicine | Admitting: Internal Medicine

## 2011-02-27 DIAGNOSIS — Z1231 Encounter for screening mammogram for malignant neoplasm of breast: Secondary | ICD-10-CM

## 2011-03-22 IMAGING — CT CT ABDOMEN WO/W CM
3 of 8 series · 16 of 36 positions shown, 19 images · IV contrast (READICAT/WATER)
Comparison: Ultrasound 01/18/2004

CLINICAL DATA: Elevated liver enzymes.  Question liver mass.
Recent autoimmune hepatitis.

CT ABDOMEN WITHOUT AND WITH CONTRAST
TECHNIQUE: Multidetector CT imaging of the abdomen was performed
following the standard protocol before and during bolus
administration of intravenous contrast.
Contrast: 100 ml Omnipaque 300 IV.

[Series 5: routine abdomen · axial · 0.62mm/px · z∈[-131,-46]mm · 2 of 48 slices shown, 5 images]
[im 16/48  soft-tissue]
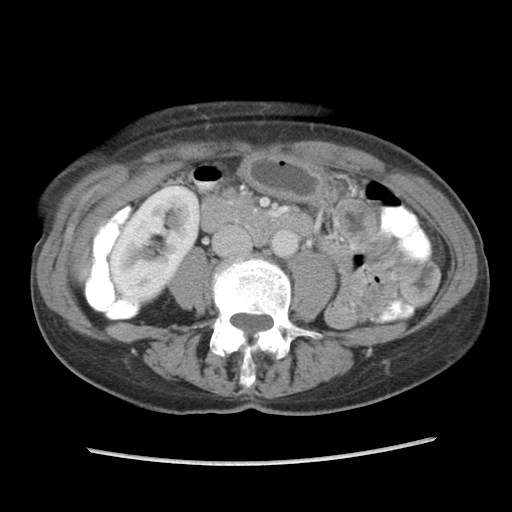
[im 16/48  lung]
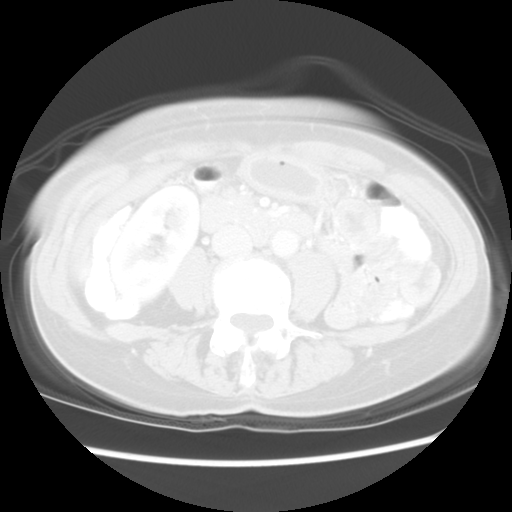
[im 16/48  bone]
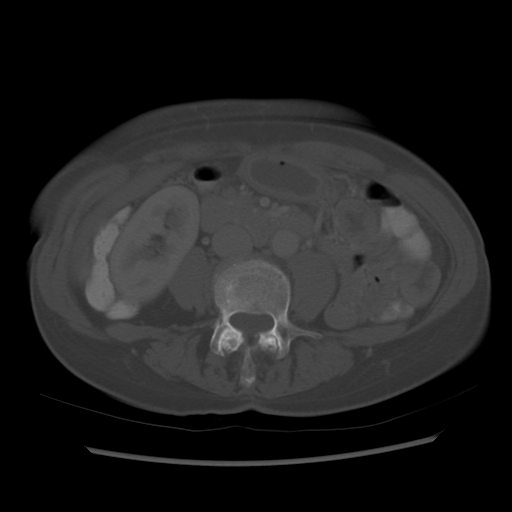
[im 32/48  soft-tissue]
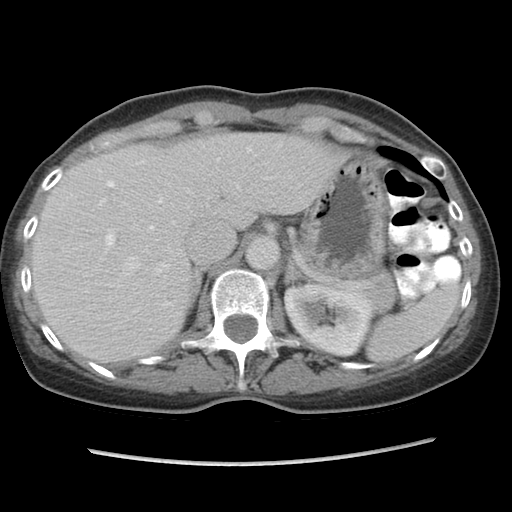
[im 32/48  lung]
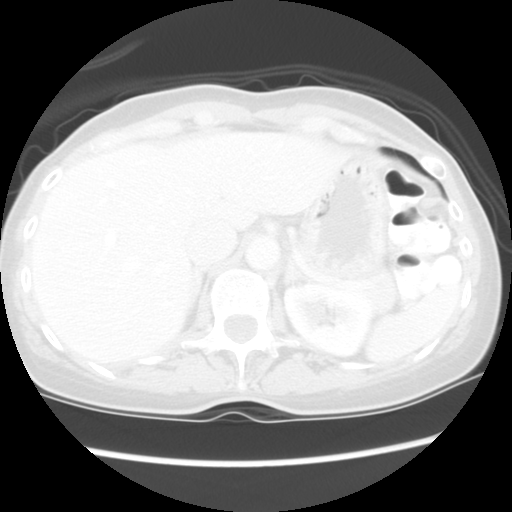

[Series 602: sagittal body · sagittal · 0.62mm/px · 8 of 129 slices shown (1 of 2)]
[im 15/129  soft-tissue]
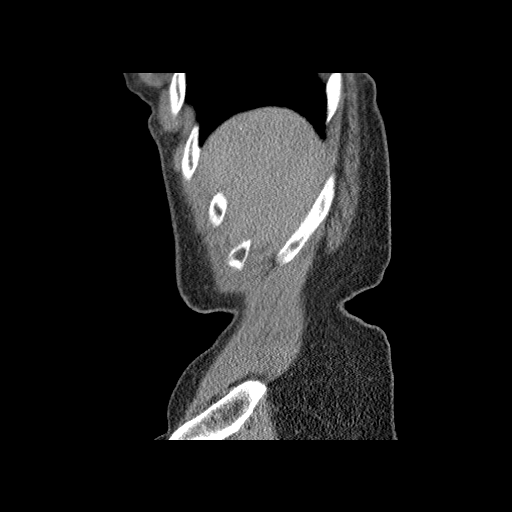
[im 29/129  soft-tissue]
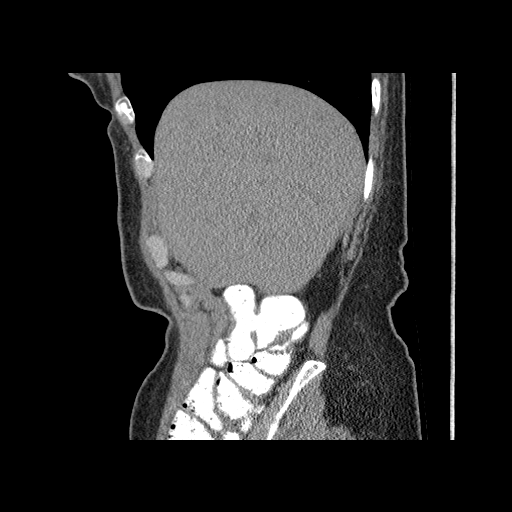
[im 43/129  soft-tissue]
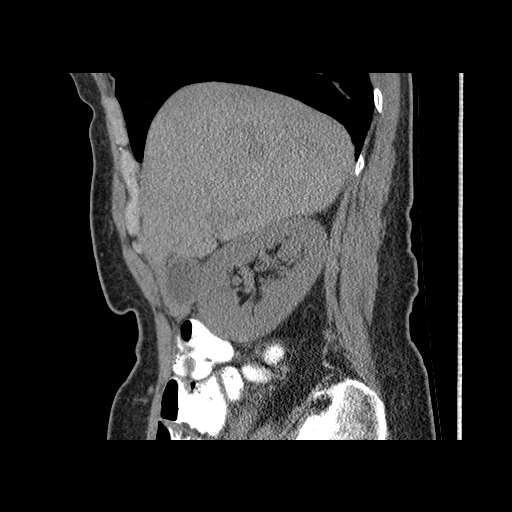
[im 57/129  soft-tissue]
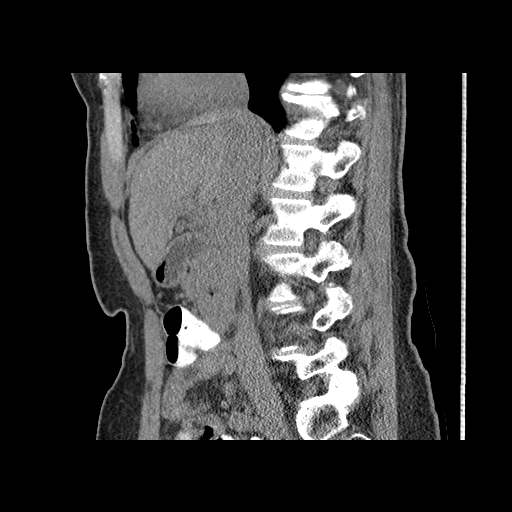
[im 72/129  soft-tissue]
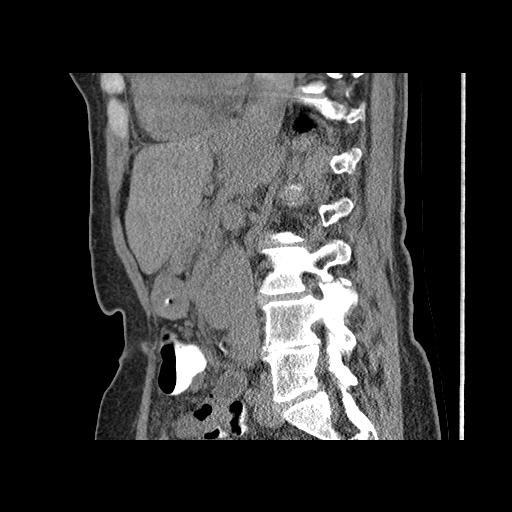
[im 86/129  soft-tissue]
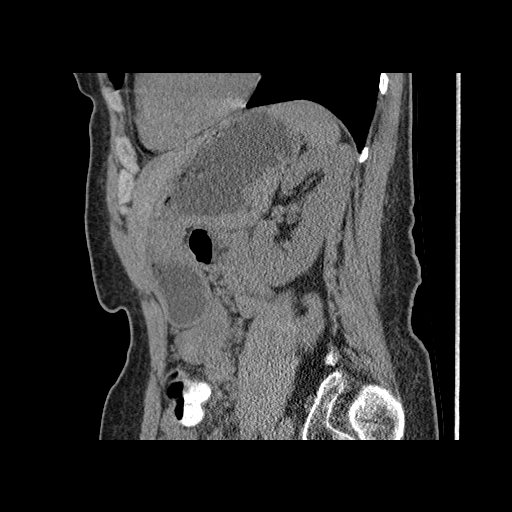
[im 100/129  soft-tissue]
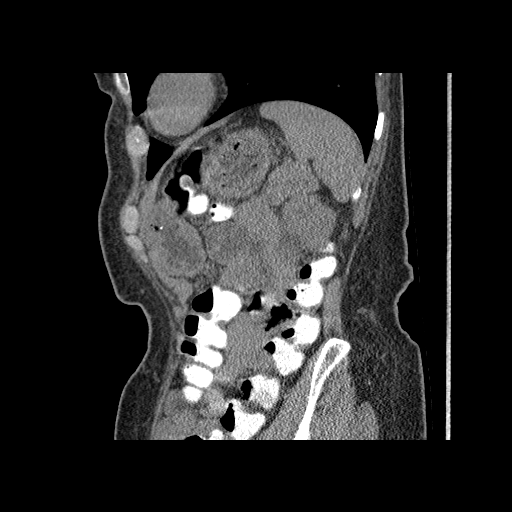
[im 114/129  soft-tissue]
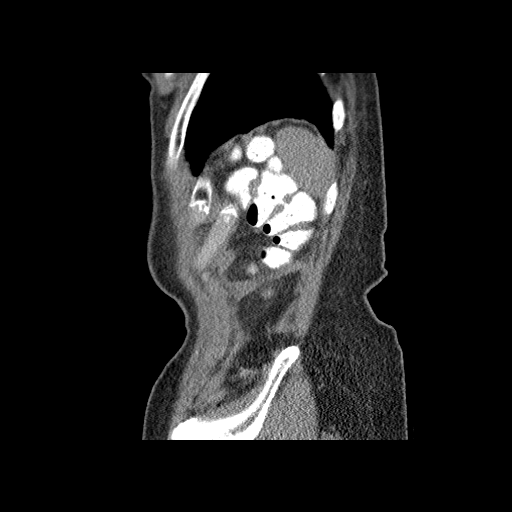

[Series 605: sagittal body · sagittal · 0.62mm/px · 6 of 129 slices shown (2 of 2)]
[im 15/129  soft-tissue]
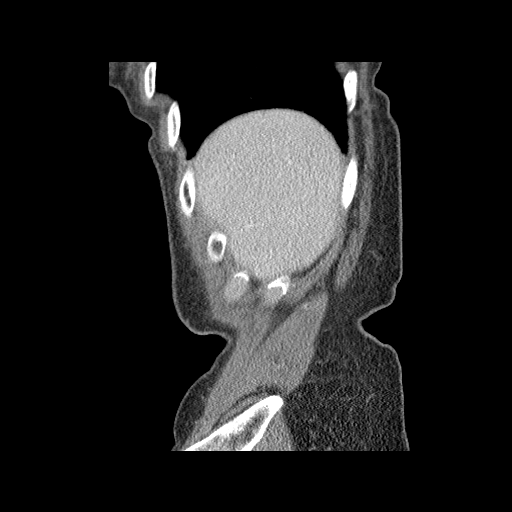
[im 29/129  soft-tissue]
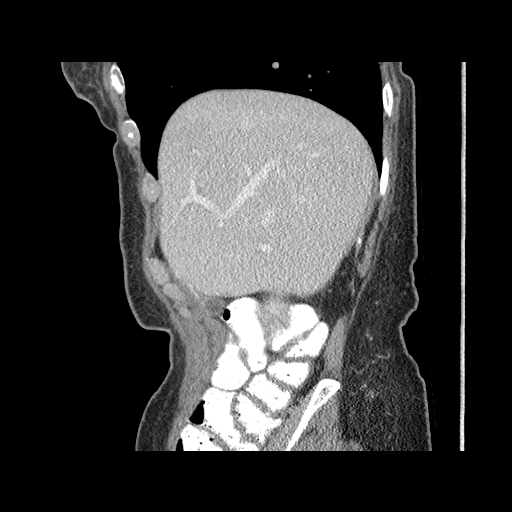
[im 43/129  soft-tissue]
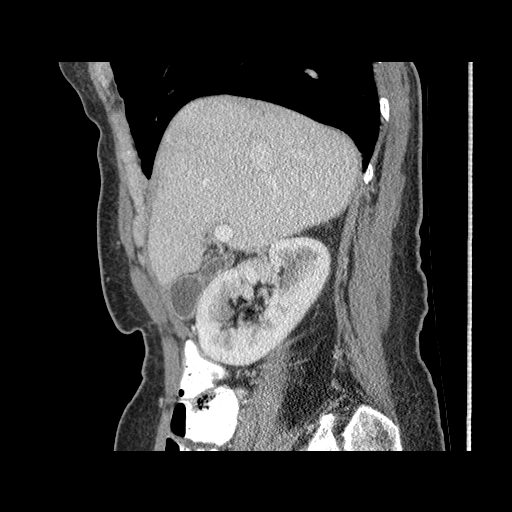
[im 57/129  soft-tissue]
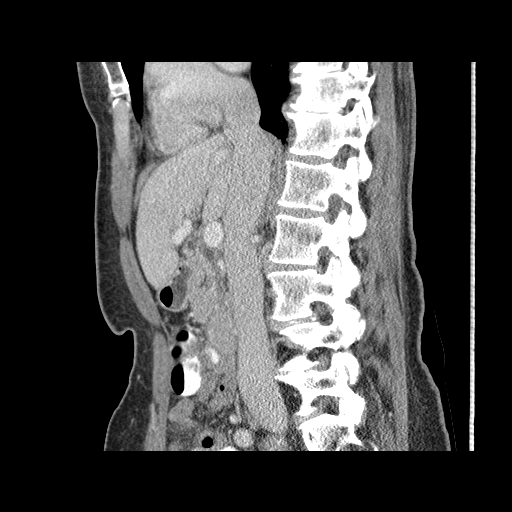
[im 72/129  soft-tissue]
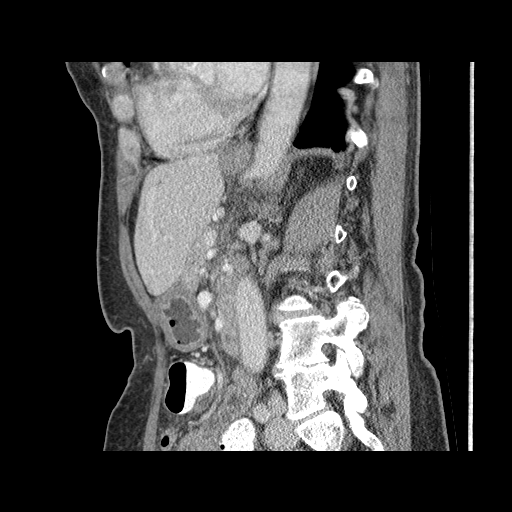
[im 86/129  soft-tissue]
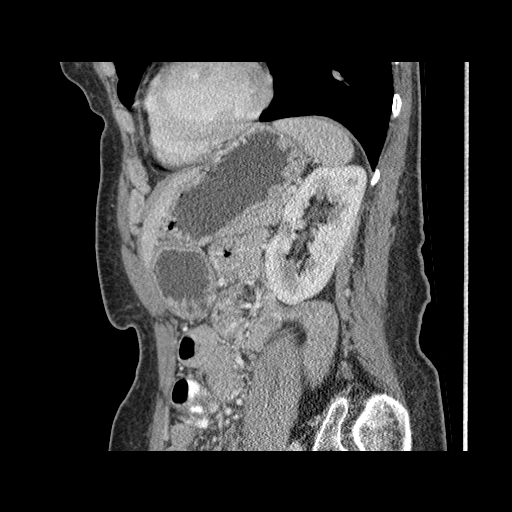

[16 of 36 positions shown; findings below may reference images not displayed]

FINDINGS: Noncontrast images through the abdomen are unremarkable.

Lung bases are clear.  No effusions.  Heart is normal size.

Following administration of IV contrast, the liver is unremarkable.
No focal abnormality.  Spleen, pancreas, adrenals, kidneys,
gallbladder unremarkable.

Bowel grossly unremarkable.  No free fluid, free air, or
adenopathy. Aorta is normal caliber.

Degenerative disc changes noted in the lower thoracic spine and at
L4-5.  Facet disease present.  Slight anterolisthesis of L3 on L4.
IMPRESSION: No acute findings in the abdomen.  No liver abnormality visualized.

Degenerative changes in the thoracolumbar spine.

## 2011-07-22 ENCOUNTER — Telehealth: Payer: Self-pay

## 2011-07-22 NOTE — Telephone Encounter (Signed)
Pt called and stated that her husband's PCP told him he needed a tdap with pertussis and an appt. [facility]  Pt states she would like to know if there is any reason she should not have the tdap. Pt stated she was last seen in May 2012 for liver function test and they were normal. Pt states her lab results were normal in May 2012, pt would like to know when or if she needs to return.  Pls advise.

## 2011-07-22 NOTE — Telephone Encounter (Signed)
Ok to do tdap.  Next visit wellness visit in May 2013. . Or as needed

## 2011-07-22 NOTE — Telephone Encounter (Signed)
Pt aware.

## 2011-10-29 ENCOUNTER — Other Ambulatory Visit: Payer: Self-pay | Admitting: Internal Medicine

## 2012-01-08 ENCOUNTER — Ambulatory Visit (INDEPENDENT_AMBULATORY_CARE_PROVIDER_SITE_OTHER): Payer: Medicare Other | Admitting: Internal Medicine

## 2012-01-08 ENCOUNTER — Encounter: Payer: Self-pay | Admitting: Internal Medicine

## 2012-01-08 VITALS — BP 100/70 | HR 70 | Temp 98.3°F | Wt 131.5 lb

## 2012-01-08 DIAGNOSIS — E785 Hyperlipidemia, unspecified: Secondary | ICD-10-CM

## 2012-01-08 DIAGNOSIS — R945 Abnormal results of liver function studies: Secondary | ICD-10-CM

## 2012-01-08 DIAGNOSIS — Z8489 Family history of other specified conditions: Secondary | ICD-10-CM

## 2012-01-08 DIAGNOSIS — Z832 Family history of diseases of the blood and blood-forming organs and certain disorders involving the immune mechanism: Secondary | ICD-10-CM

## 2012-01-08 DIAGNOSIS — R7989 Other specified abnormal findings of blood chemistry: Secondary | ICD-10-CM

## 2012-01-08 DIAGNOSIS — D72819 Decreased white blood cell count, unspecified: Secondary | ICD-10-CM

## 2012-01-08 DIAGNOSIS — M899 Disorder of bone, unspecified: Secondary | ICD-10-CM

## 2012-01-08 DIAGNOSIS — D709 Neutropenia, unspecified: Secondary | ICD-10-CM

## 2012-01-08 DIAGNOSIS — E039 Hypothyroidism, unspecified: Secondary | ICD-10-CM

## 2012-01-08 DIAGNOSIS — M949 Disorder of cartilage, unspecified: Secondary | ICD-10-CM

## 2012-01-08 LAB — CBC WITH DIFFERENTIAL/PLATELET
Basophils Relative: 1.4 % (ref 0.0–3.0)
Eosinophils Relative: 2.7 % (ref 0.0–5.0)
Lymphocytes Relative: 35.1 % (ref 12.0–46.0)
MCV: 96.9 fl (ref 78.0–100.0)
Monocytes Absolute: 0.3 10*3/uL (ref 0.1–1.0)
Neutrophils Relative %: 50 % (ref 43.0–77.0)
Platelets: 225 10*3/uL (ref 150.0–400.0)
RBC: 4.4 Mil/uL (ref 3.87–5.11)
WBC: 3 10*3/uL — ABNORMAL LOW (ref 4.5–10.5)

## 2012-01-08 LAB — LIPID PANEL
Cholesterol: 304 mg/dL — ABNORMAL HIGH (ref 0–200)
HDL: 117.1 mg/dL (ref >39.00)
Total CHOL/HDL Ratio: 3
Triglycerides: 49 mg/dL (ref 0.0–149.0)

## 2012-01-08 LAB — HEPATIC FUNCTION PANEL
ALT: 24 U/L (ref 0–35)
AST: 29 U/L (ref 0–37)
Albumin: 4.5 g/dL (ref 3.5–5.2)
Total Protein: 7.7 g/dL (ref 6.0–8.3)

## 2012-01-08 LAB — BASIC METABOLIC PANEL
BUN: 21 mg/dL (ref 6–23)
CO2: 28 mEq/L (ref 19–32)
Calcium: 9.7 mg/dL (ref 8.4–10.5)
Chloride: 104 mEq/L (ref 96–112)
Creatinine, Ser: 0.6 mg/dL (ref 0.4–1.2)
Glucose, Bld: 98 mg/dL (ref 70–99)

## 2012-01-08 NOTE — Patient Instructions (Signed)
Continue lifestyle intervention healthy eating and exercise .  Will notify you  of labs when available. Then decide on when to do labs next .  bone density next year.

## 2012-01-08 NOTE — Progress Notes (Signed)
Subjective:    Patient ID: Crystal Odom, female    DOB: Sep 12, 1941, 71 y.o.   MRN: [id number]  HPI  Patient comes in today for follow up of  multiple medical problems.  Yearly check  Last saw  Dr Dareen Piano in July   Said to check labs here periodically as she was doing well without sx and labs better . Last time liver  Was almost normal. She is Limiting tylenol and etoh.   No hx of statin use need.  No sx .  Has markers for autoimmunie disease and antism antibodies but neg liver bx done 2011  She feels well without sig arthropathy .   Husband had cabg last fall and now more careful  healthy eating Exercising some  .   Yoga and walking and some weights.  Review of Systems ROS:  GEN/ HEENT: No fever, significant weight changes sweats headaches vision problems hearing changes, CV/ PULM; No chest pain shortness of breath cough, syncope,edema  change in exercise tolerance. GI /GU: No adominal pain, vomiting, change in bowel habits. No blood in the stool. No significant GU symptoms. SKIN/HEME: ,no acute skin rashes suspicious lesions or bleeding. No lymphadenopathy, nodules, masses.  NEURO/ PSYCH:  No neurologic signs such as weakness numbness. No depression anxiety. IMM/ Allergy: No unusual infections.  Allergy .   REST of 12 system review negative except as per HPI  Past history family history social history reviewed in the electronic medical record. Outpatient Prescriptions Prior to Visit  Medication Sig Dispense Refill  . aspirin 81 MG tablet Take 81 mg by mouth daily.        . calcium-vitamin D (OSCAL WITH D) 500-200 MG-UNIT per tablet Take 1 tablet by mouth daily.        . fish oil-omega-3 fatty acids 1000 MG capsule Take 2 g by mouth daily.        Marland Kitchen LEVOXYL 88 MCG tablet TAKE ONE TABLET BY MOUTH ONE TIME DAILY  30 each  2       Objective:   Physical Exam BP 100/70  Pulse 70  Temp(Src) 98.3 F (36.8 C) (Oral)  Wt 131 lb 8 oz (59.648 kg)  SpO2 98%  Physical  Exam: Vital signs reviewed WUJ:WJXB is a well-developed well-nourished alert cooperative  white female who appears her stated age in no acute distress.  HEENT: normocephalic atraumatic , Eyes: PERRL EOM's full, conjunctiva clear, Nares: paten,t no deformity discharge or tenderness., Ears: no deformity EAC's clear TMs with normal landmarks. Mouth: clear OP, no lesions, edema.  Moist mucous membranes. Dentition in adequate repair. NECK: supple without masses,  bruits. CHEST/PULM:  Clear to auscultation and percussion breath sounds equal no wheeze , rales or rhonchi. No chest wall deformities or tenderness. CV: PMI is nondisplaced, S1 S2 no gallops, murmurs, rubs. Peripheral pulses are full without delay.No JVD .  ABDOMEN: Bowel sounds normal nontender  No guard or rebound, no hepato splenomegal no CVA tenderness.  Extremtities:  No clubbing cyanosis or edema, no acute joint swelling or redness no focal atrophy NEURO:  Oriented x3, cranial nerves 3-12 appear to be intact, no obvious focal weakness,gait within normal limits SKIN: No acute rashes normal turgor, color, no bruising or petechiae. PSYCH: Oriented, good eye contact, no obvious depression anxiety, cognition and judgment appear normal. LN: no cervical l adenopathy       Assessment & Plan:  Abnormal LFts poss autoimmune etiology  Nl liver bx  Abnormal serology anti sm m aby Leukopenia  Had heme eval and stable  Hypothyroid   At risk for autoimmune disease Actually doing quite well.   reviewed HCM  To get mammogram  Last colon 2005 Can get dexa next year had minimal osteopenia and no hx of fx in 2010

## 2012-01-08 NOTE — Assessment & Plan Note (Signed)
Ok to repeat dexa next year  2013  Disc ca vit d recs

## 2012-01-09 DIAGNOSIS — D72819 Decreased white blood cell count, unspecified: Secondary | ICD-10-CM | POA: Insufficient documentation

## 2012-01-09 DIAGNOSIS — R945 Abnormal results of liver function studies: Secondary | ICD-10-CM | POA: Insufficient documentation

## 2012-01-09 DIAGNOSIS — Z87442 Personal history of urinary calculi: Secondary | ICD-10-CM | POA: Insufficient documentation

## 2012-01-09 LAB — VITAMIN D 25 HYDROXY (VIT D DEFICIENCY, FRACTURES): Vit D, 25-Hydroxy: 47 ng/mL (ref 30–89)

## 2012-01-10 NOTE — Progress Notes (Signed)
Quick Note:  Pt aware of lab results. Copy mailed. ______

## 2012-01-10 NOTE — Progress Notes (Signed)
Addended by: Azucena Freed on: 01/10/2012 04:39 PM   Modules accepted: Orders

## 2012-01-25 ENCOUNTER — Other Ambulatory Visit: Payer: Self-pay | Admitting: Internal Medicine

## 2012-02-03 ENCOUNTER — Other Ambulatory Visit: Payer: Self-pay

## 2012-02-03 MED ORDER — LEVOTHYROXINE SODIUM 88 MCG PO TABS: 88.0000 ug | ORAL_TABLET | Freq: Every day | ORAL | Status: DC

## 2012-02-03 NOTE — Telephone Encounter (Signed)
Per the pharmacy Levoxyl 88 mcg is on back order.  Per Dr. Fabian Sharp pt can switch to synthroid 88 mcg.    voicemail from Saturday:  Pt called and states [facility] told her the rx has expired and can not be authorized any more.  Pt states she is going to [place] on Monday and was told by [facility] that if the rx is sent to them pt can pick up the rx in [place] at [facility].    Rx sent to pharmacy.

## 2012-02-24 ENCOUNTER — Other Ambulatory Visit: Payer: Self-pay | Admitting: Internal Medicine

## 2012-03-03 ENCOUNTER — Telehealth: Payer: Self-pay | Admitting: Family Medicine

## 2012-03-03 NOTE — Telephone Encounter (Signed)
error 

## 2012-03-26 ENCOUNTER — Other Ambulatory Visit: Payer: Self-pay | Admitting: Internal Medicine

## 2012-03-26 DIAGNOSIS — Z1231 Encounter for screening mammogram for malignant neoplasm of breast: Secondary | ICD-10-CM

## 2012-04-16 ENCOUNTER — Ambulatory Visit
Admission: RE | Admit: 2012-04-16 | Discharge: 2012-04-16 | Disposition: A | Discharge status: Home / Self Care | Payer: Medicare Other | Source: Ambulatory Visit | Attending: Internal Medicine | Admitting: Internal Medicine

## 2012-04-16 DIAGNOSIS — Z1231 Encounter for screening mammogram for malignant neoplasm of breast: Secondary | ICD-10-CM

## 2012-06-30 ENCOUNTER — Other Ambulatory Visit: Payer: Self-pay | Admitting: Family Medicine

## 2012-06-30 MED ORDER — LEVOTHYROXINE SODIUM 88 MCG PO TABS: 88.0000 ug | ORAL_TABLET | Freq: Every day | ORAL | Status: DC

## 2012-07-06 ENCOUNTER — Other Ambulatory Visit (INDEPENDENT_AMBULATORY_CARE_PROVIDER_SITE_OTHER): Payer: Medicare Other

## 2012-07-06 DIAGNOSIS — M949 Disorder of cartilage, unspecified: Secondary | ICD-10-CM

## 2012-07-06 DIAGNOSIS — M899 Disorder of bone, unspecified: Secondary | ICD-10-CM

## 2012-07-06 DIAGNOSIS — R7989 Other specified abnormal findings of blood chemistry: Secondary | ICD-10-CM

## 2012-07-06 DIAGNOSIS — R945 Abnormal results of liver function studies: Secondary | ICD-10-CM

## 2012-07-06 DIAGNOSIS — D709 Neutropenia, unspecified: Secondary | ICD-10-CM

## 2012-07-06 LAB — CBC WITH DIFFERENTIAL/PLATELET
Basophils Absolute: 0 10*3/uL (ref 0.0–0.1)
Eosinophils Absolute: 0.1 10*3/uL (ref 0.0–0.7)
Lymphocytes Relative: 36.8 % (ref 12.0–46.0)
MCHC: 32.9 g/dL (ref 30.0–36.0)
Monocytes Relative: 12.7 % — ABNORMAL HIGH (ref 3.0–12.0)
Neutrophils Relative %: 44.8 % (ref 43.0–77.0)
RBC: 4.34 Mil/uL (ref 3.87–5.11)
RDW: 14 % (ref 11.5–14.6)

## 2012-07-06 LAB — HEPATIC FUNCTION PANEL
ALT: 31 U/L (ref 0–35)
Albumin: 3.8 g/dL (ref 3.5–5.2)
Alkaline Phosphatase: 71 U/L (ref 39–117)
Bilirubin, Direct: 0 mg/dL (ref 0.0–0.3)
Total Protein: 7.4 g/dL (ref 6.0–8.3)

## 2012-07-20 ENCOUNTER — Other Ambulatory Visit: Payer: Self-pay | Admitting: Family Medicine

## 2012-07-20 DIAGNOSIS — E785 Hyperlipidemia, unspecified: Secondary | ICD-10-CM

## 2012-07-20 DIAGNOSIS — Z0000 Encounter for general adult medical examination without abnormal findings: Secondary | ICD-10-CM

## 2012-07-20 DIAGNOSIS — D72819 Decreased white blood cell count, unspecified: Secondary | ICD-10-CM

## 2012-07-20 DIAGNOSIS — E039 Hypothyroidism, unspecified: Secondary | ICD-10-CM

## 2012-12-25 ENCOUNTER — Other Ambulatory Visit: Payer: Self-pay | Admitting: Internal Medicine

## 2013-02-02 ENCOUNTER — Encounter: Payer: Self-pay | Admitting: Internal Medicine

## 2013-02-02 ENCOUNTER — Ambulatory Visit (INDEPENDENT_AMBULATORY_CARE_PROVIDER_SITE_OTHER): Payer: Medicare Other | Admitting: Internal Medicine

## 2013-02-02 VITALS — BP 130/80 | HR 68 | Temp 98.1°F | Ht 65.0 in | Wt 134.0 lb

## 2013-02-02 DIAGNOSIS — Z23 Encounter for immunization: Secondary | ICD-10-CM

## 2013-02-02 DIAGNOSIS — R945 Abnormal results of liver function studies: Secondary | ICD-10-CM

## 2013-02-02 DIAGNOSIS — Z0000 Encounter for general adult medical examination without abnormal findings: Secondary | ICD-10-CM | POA: Insufficient documentation

## 2013-02-02 DIAGNOSIS — E785 Hyperlipidemia, unspecified: Secondary | ICD-10-CM

## 2013-02-02 DIAGNOSIS — E039 Hypothyroidism, unspecified: Secondary | ICD-10-CM

## 2013-02-02 DIAGNOSIS — D709 Neutropenia, unspecified: Secondary | ICD-10-CM

## 2013-02-02 LAB — CBC WITH DIFFERENTIAL/PLATELET
Basophils Absolute: 0 10*3/uL (ref 0.0–0.1)
Eosinophils Absolute: 0.1 10*3/uL (ref 0.0–0.7)
Eosinophils Relative: 2.9 % (ref 0.0–5.0)
HCT: 40.8 % (ref 36.0–46.0)
Lymphs Abs: 0.9 10*3/uL (ref 0.7–4.0)
MCHC: 34.3 g/dL (ref 30.0–36.0)
MCV: 93.5 fl (ref 78.0–100.0)
Monocytes Absolute: 0.4 10*3/uL (ref 0.1–1.0)
Platelets: 270 10*3/uL (ref 150.0–400.0)
RDW: 14.9 % — ABNORMAL HIGH (ref 11.5–14.6)

## 2013-02-02 LAB — BASIC METABOLIC PANEL
Calcium: 9.8 mg/dL (ref 8.4–10.5)
GFR: 88.01 mL/min (ref >60.00)
Glucose, Bld: 98 mg/dL (ref 70–99)
Sodium: 141 mEq/L (ref 135–145)

## 2013-02-02 LAB — HEPATIC FUNCTION PANEL
Albumin: 4.2 g/dL (ref 3.5–5.2)
Alkaline Phosphatase: 86 U/L (ref 39–117)
Total Bilirubin: 0.7 mg/dL (ref 0.3–1.2)

## 2013-02-02 LAB — LIPID PANEL
HDL: 132 mg/dL (ref >39.00)
Triglycerides: 47 mg/dL (ref 0.0–149.0)
VLDL: 9.4 mg/dL (ref 0.0–40.0)

## 2013-02-02 NOTE — Progress Notes (Signed)
Chief Complaint  Patient presents with  . Medicare Wellness    thyroid     HPI: Patient comes in today for Preventive Medicare wellness visit . And medical problem management  No major injuries, ed visits ,hospitalizations , new medications since last visit.   Thyroid   Now on generic will need refill   Low WBC   No infecitons or problems following  Right knee  Some problems limit swatting but can walk well   Arthritis   hsant seen dr Dareen Piano recnetly stable on a prn basis and send  Note to him to see him if needed  Good eye check recently  Blima Ledger  glasses   Hearing:  Ok   Vision:  No limitations at present . Last eye check UTD  Safety:  Has smoke detector and wears seat belts.  No firearms. No excess sun exposure. Sees dentist regularly.  Falls:  no  Advance directive :  Reviewed   Memory: Felt to be good  , no concern from her or her family.  Depression: No anhedonia unusual crying or depressive symptoms  Nutrition: Eats well balanced diet; adequate calcium and vitamin D. No swallowing chewing problems.  Injury: no major injuries in the last six months.  Other healthcare providers:  Reviewed today .  Social:  Lives with spouse married. No pets.   Preventive parameters: up-to-date  Reviewed   ADLS:   There are no problems or need for assistance  driving, feeding, obtaining food, dressing, toileting and bathing, managing money using phone. She is independent.  EXERCISE/ HABITS  Per week  Yoga walking strength ing   No tobacco    etoh 2 x per week.    ROS:  GEN/ HEENT: No fever, significant weight changes sweats headaches vision problems hearing changes, CV/ PULM; No chest pain shortness of breath cough, syncope,edema  change in exercise tolerance. GI /GU: No adominal pain, vomiting, change in bowel habits. No blood in the stool. No significant GU symptoms. SKIN/HEME: ,no acute skin rashes suspicious lesions or bleeding. No lymphadenopathy, nodules, masses.   NEURO/ PSYCH:  No neurologic signs such as weakness numbness. No depression anxiety. IMM/ Allergy: No unusual infections.  Allergy .   REST of 12 system review negative except as per HPI   Past Medical History  Diagnosis Date  . Kidney stones   . UTI (lower urinary tract infection)   . Hyperlipidemia     HDL over 100  . Hypothyroidism   . Arthritis     left knee  . Leukopenia     with neg hemevaluation bm bx get wbc diff q 6 months  . 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 Diplopia 2002    eval neg    Family History  Problem Relation Age of Onset  . Other Father     major renal failure; SCCA in ear  . Heart disease Mother     36  . Osteoarthritis Father   . Autoimmune disease Sister   . Autoimmune disease Brother   . Thyroid disease    . Stroke    . Osteoporosis      History   Social History  . Marital Status: Married    Spouse Name: N/A    Number of Children: N/A  . Years of Education: N/A   Social History Main Topics  . Smoking status: Never Smoker   . Smokeless tobacco: None  . Alcohol Use:  0.0 oz/week    5-7 Glasses of wine per week  . Drug Use: No  . Sexually Active:    Other Topics Concern  . None   Social History Narrative   Married   Retired  2008   Regular exercise-yes   HH of 2   No pets   Social etoh   G2P2      Husband hac CABG 2012   Past Surgical History  Procedure Laterality Date  . Breast biopsy    . Kidney stone removal    . Tonsillectomy    . Left arthroscopic  2005  . Bone marrow biopsy       Outpatient Encounter Prescriptions as of 02/02/2013  Medication Sig Dispense Refill  . aspirin 81 MG tablet Take 81 mg by mouth daily.        . calcium-vitamin D (OSCAL WITH D) 500-200 MG-UNIT per tablet Take 1 tablet by mouth daily.        . fish oil-omega-3 fatty acids 1000 MG capsule Take 2 g by mouth daily.        Marland Kitchen levothyroxine (SYNTHROID, LEVOTHROID) 88 MCG tablet TAKE ONE  TABLET BY MOUTH ONE TIME DAILY  30 tablet  0  . Multiple Vitamin (MULTI-VITAMIN DAILY PO) Take by mouth. Senior Vitamin      . [DISCONTINUED] LEVOXYL 88 MCG tablet TAKE ONE TABLET BY MOUTH ONE TIME DAILY  30 each  1   No facility-administered encounter medications on file as of 02/02/2013.    EXAM:  BP 130/80  Pulse 68  Temp(Src) 98.1 F (36.7 C) (Oral)  Ht 5\' 5"  (1.651 m)  Wt 134 lb (60.782 kg)  BMI 22.3 kg/m2  SpO2 99%  Body mass index is 22.3 kg/(m^2).  Physical Exam: Vital signs reviewed RUE:AVWU is a well-developed well-nourished alert cooperative   who appears stated age in no acute distress.  HEENT: normocephalic atraumatic , Eyes: PERRL EOM's full, conjunctiva clear, Nares: paten,t no deformity discharge or tenderness., Ears: no deformity EAC's clear TMs with normal landmarks. Mouth: clear OP, no lesions, edema.  Moist mucous membranes. Dentition in adequate repair. NECK: supple without masses,  or bruits. . CHEST/PULM:  Clear to auscultation and percussion breath sounds equal no wheeze , rales or rhonchi. No chest wall deformities or tenderness. Breast: normal by inspection . No dimpling, discharge, masses, tenderness or discharge . CV: PMI is nondisplaced, S1 S2 no gallops, murmurs, rubs. Peripheral pulses are full without delay.No JVD .  ABDOMEN: Bowel sounds normal nontender  No guard or rebound, no hepato splenomegal no CVA tenderness.  No hernia. Extremtities:  No clubbing cyanosis or edema, no acute joint swelling or redness no focal atrophy some djd changes hands  Mild no atrophy  NEURO:  Oriented x3, cranial nerves 3-12 appear to be intact, no obvious focal weakness,gait within normal limits no abnormal reflexes or asymmetrical SKIN: No acute rashes normal turgor, color, no bruising or petechiae. PSYCH: Oriented, good eye contact, no obvious depression anxiety, cognition and judgment appear normal. LN: no cervical axillary inguinal adenopathy No noted deficits in  memory, attention, and speech.   Lab Results  Component Value Date   WBC 2.6* 07/06/2012   HGB 13.7 07/06/2012   HCT 41.6 07/06/2012   PLT 270.0 07/06/2012   GLUCOSE 98 01/08/2012   CHOL 304* 01/08/2012   TRIG 49.0 01/08/2012   HDL 117.10 01/08/2012   LDLDIRECT 166.9 01/08/2012   ALT 31 07/06/2012   AST 30 07/06/2012   NA 144  01/08/2012   K 4.5 01/08/2012   CL 104 01/08/2012   CREATININE 0.6 01/08/2012   BUN 21 01/08/2012   CO2 28 01/08/2012   TSH 1.04 01/08/2012   INR 0.91 11/10/2009    ASSESSMENT AND PLAN:  Discussed the following assessment and plan:  Medicare annual wellness visit, initial - Plan: Multiple Vitamin (MULTI-VITAMIN DAILY PO), Lipid panel, TSH, CBC with Differential, Basic metabolic panel, Hepatic function panel  HYPOTHYROIDISM - Plan: Multiple Vitamin (MULTI-VITAMIN DAILY PO), Lipid panel, TSH, CBC with Differential, Basic metabolic panel, Hepatic function panel  HYPERLIPIDEMIA - Plan: Multiple Vitamin (MULTI-VITAMIN DAILY PO), Lipid panel, TSH, CBC with Differential, Basic metabolic panel, Hepatic function panel  NEUTROPENIA UNSPECIFIED - Plan: Multiple Vitamin (MULTI-VITAMIN DAILY PO), Lipid panel, TSH, CBC with Differential, Basic metabolic panel, Hepatic function panel  LIVER FUNCTION TESTS, ABNORMAL - Plan: Multiple Vitamin (MULTI-VITAMIN DAILY PO), Lipid panel, TSH, CBC with Differential, Basic metabolic panel, Hepatic function panel  Need for Tdap vaccination - Plan: Tdap vaccine greater than or equal to 7yo IM Counseled regarding healthy nutrition, exercise, sleep, injury prevention, calcium vit d and healthy weight .  Patient Care Team: Madelin Headings, MD as PCP - General Iva Boop, MD (Gastroenterology) Nilda Simmer, MD (Orthopedic Surgery) Mardella Layman, MD (Gastroenterology) Sherrian Divers, MD (Internal Medicine)  Patient Instructions  Continue lifestyle intervention healthy eating and exercise . Will notify you  of labs when  available. Will send in thyroid med depending on lab results. Will send copy to dr Dareen Piano also. If ok then yearly wellness and labs .       Neta Mends. Erik Nessel M.D. Health Maintenance  Topic Date Due  . Influenza Vaccine  05/31/2013  . Colonoscopy  01/29/2014  . Mammogram  04/16/2014  . Tetanus/tdap  02/03/2023  . Pneumococcal Polysaccharide Vaccine Age 79 And Over  Completed  . Zostavax  Completed   Health Maintenance Review

## 2013-02-02 NOTE — Patient Instructions (Signed)
Continue lifestyle intervention healthy eating and exercise . Will notify you  of labs when available. Will send in thyroid med depending on lab results. Will send copy to dr Dareen Piano also. If ok then yearly wellness and labs .

## 2013-02-04 ENCOUNTER — Other Ambulatory Visit: Payer: Self-pay | Admitting: Family Medicine

## 2013-02-04 MED ORDER — LEVOTHYROXINE SODIUM 88 MCG PO TABS: ORAL_TABLET | ORAL | Status: DC

## 2013-04-01 ENCOUNTER — Other Ambulatory Visit: Payer: Self-pay

## 2013-04-01 DIAGNOSIS — Z1231 Encounter for screening mammogram for malignant neoplasm of breast: Secondary | ICD-10-CM

## 2013-04-20 ENCOUNTER — Ambulatory Visit
Admission: RE | Admit: 2013-04-20 | Discharge: 2013-04-20 | Disposition: A | Discharge status: Home / Self Care | Payer: Medicare Other | Source: Ambulatory Visit

## 2013-04-20 DIAGNOSIS — Z1231 Encounter for screening mammogram for malignant neoplasm of breast: Secondary | ICD-10-CM

## 2013-06-22 ENCOUNTER — Encounter: Payer: Self-pay | Admitting: Internal Medicine

## 2013-07-12 ENCOUNTER — Other Ambulatory Visit: Payer: Self-pay | Admitting: Dermatology

## 2014-01-25 ENCOUNTER — Other Ambulatory Visit: Payer: Self-pay | Admitting: Internal Medicine

## 2014-01-31 ENCOUNTER — Encounter: Payer: Self-pay | Admitting: Internal Medicine

## 2014-03-22 ENCOUNTER — Other Ambulatory Visit: Payer: Self-pay

## 2014-03-22 ENCOUNTER — Encounter: Payer: Self-pay | Admitting: Internal Medicine

## 2014-03-22 DIAGNOSIS — Z1231 Encounter for screening mammogram for malignant neoplasm of breast: Secondary | ICD-10-CM

## 2014-04-21 ENCOUNTER — Ambulatory Visit
Admission: RE | Admit: 2014-04-21 | Discharge: 2014-04-21 | Disposition: A | Discharge status: Home / Self Care | Payer: Medicare Other | Source: Ambulatory Visit

## 2014-04-21 DIAGNOSIS — Z1231 Encounter for screening mammogram for malignant neoplasm of breast: Secondary | ICD-10-CM

## 2014-05-02 ENCOUNTER — Encounter: Payer: Self-pay | Admitting: Internal Medicine

## 2014-05-02 ENCOUNTER — Ambulatory Visit (INDEPENDENT_AMBULATORY_CARE_PROVIDER_SITE_OTHER): Payer: Medicare Other | Admitting: Internal Medicine

## 2014-05-02 VITALS — BP 120/70 | Temp 98.6°F | Ht 65.0 in | Wt 136.0 lb

## 2014-05-02 DIAGNOSIS — Z8489 Family history of other specified conditions: Secondary | ICD-10-CM

## 2014-05-02 DIAGNOSIS — R945 Abnormal results of liver function studies: Secondary | ICD-10-CM

## 2014-05-02 DIAGNOSIS — E039 Hypothyroidism, unspecified: Secondary | ICD-10-CM

## 2014-05-02 DIAGNOSIS — Z23 Encounter for immunization: Secondary | ICD-10-CM

## 2014-05-02 DIAGNOSIS — D72819 Decreased white blood cell count, unspecified: Secondary | ICD-10-CM

## 2014-05-02 DIAGNOSIS — E785 Hyperlipidemia, unspecified: Secondary | ICD-10-CM

## 2014-05-02 DIAGNOSIS — Z832 Family history of diseases of the blood and blood-forming organs and certain disorders involving the immune mechanism: Secondary | ICD-10-CM

## 2014-05-02 DIAGNOSIS — Z0000 Encounter for general adult medical examination without abnormal findings: Secondary | ICD-10-CM

## 2014-05-02 LAB — CBC WITH DIFFERENTIAL/PLATELET
BASOS ABS: 0 10*3/uL (ref 0.0–0.1)
Basophils Relative: 0.6 % (ref 0.0–3.0)
EOS PCT: 3.4 % (ref 0.0–5.0)
Eosinophils Absolute: 0.1 10*3/uL (ref 0.0–0.7)
HEMATOCRIT: 39.8 % (ref 36.0–46.0)
Hemoglobin: 13.5 g/dL (ref 12.0–15.0)
LYMPHS ABS: 1.2 10*3/uL (ref 0.7–4.0)
Lymphocytes Relative: 37.6 % (ref 12.0–46.0)
MCHC: 33.8 g/dL (ref 30.0–36.0)
MCV: 93.2 fl (ref 78.0–100.0)
MONO ABS: 0.4 10*3/uL (ref 0.1–1.0)
Monocytes Relative: 12.2 % — ABNORMAL HIGH (ref 3.0–12.0)
NEUTROS PCT: 46.2 % (ref 43.0–77.0)
Neutro Abs: 1.5 10*3/uL (ref 1.4–7.7)
PLATELETS: 242 10*3/uL (ref 150.0–400.0)
RBC: 4.28 Mil/uL (ref 3.87–5.11)
RDW: 14.9 % (ref 11.5–15.5)
WBC: 3.2 10*3/uL — AB (ref 4.0–10.5)

## 2014-05-02 LAB — BASIC METABOLIC PANEL
BUN: 19 mg/dL (ref 6–23)
CALCIUM: 9.3 mg/dL (ref 8.4–10.5)
CO2: 29 mEq/L (ref 19–32)
Chloride: 105 mEq/L (ref 96–112)
Creatinine, Ser: 0.6 mg/dL (ref 0.4–1.2)
GFR: 97.25 mL/min (ref >60.00)
GLUCOSE: 81 mg/dL (ref 70–99)
POTASSIUM: 5 meq/L (ref 3.5–5.1)
SODIUM: 138 meq/L (ref 135–145)

## 2014-05-02 LAB — HEPATIC FUNCTION PANEL
ALK PHOS: 66 U/L (ref 39–117)
ALT: 25 U/L (ref 0–35)
AST: 31 U/L (ref 0–37)
Albumin: 4 g/dL (ref 3.5–5.2)
BILIRUBIN DIRECT: 0 mg/dL (ref 0.0–0.3)
BILIRUBIN TOTAL: 0.9 mg/dL (ref 0.2–1.2)
Total Protein: 7.3 g/dL (ref 6.0–8.3)

## 2014-05-02 LAB — LIPID PANEL
CHOL/HDL RATIO: 2
Cholesterol: 276 mg/dL — ABNORMAL HIGH (ref 0–200)
HDL: 120 mg/dL (ref >39.00)
LDL CALC: 145 mg/dL — AB (ref 0–99)
NONHDL: 156
Triglycerides: 54 mg/dL (ref 0.0–149.0)
VLDL: 10.8 mg/dL (ref 0.0–40.0)

## 2014-05-02 LAB — TSH: TSH: 0.74 u[IU]/mL (ref 0.35–4.50)

## 2014-05-02 NOTE — Addendum Note (Signed)
Addended by: Miles Costain T on: 05/02/2014 11:26 AM   Modules accepted: Orders

## 2014-05-02 NOTE — Patient Instructions (Signed)
Will notify you  of labs when available. Check bp to ensure normal out of office  Continue lifestyle intervention healthy eating and exercise . Healthy lifestyle includes : At least 150 minutes of exercise weeks  , weight at healthy levels, which is usually   BMI 19-25. Avoid trans fats and processed foods;  Increase fresh fruits and veges to 5 servings per day. And avoid sweet beverages including tea and juice. Mediterranean diet with olive oil and nuts have been noted to be heart and brain healthy . Avoid tobacco products . Limit  alcohol to  7 per week for women and 14 servings for men.  Get adequate sleep .  Plan to Get bone density next  year.

## 2014-05-02 NOTE — Progress Notes (Signed)
Pre visit review using our clinic review tool, if applicable. No additional management support is needed unless otherwise documented below in the visit note.  Chief Complaint  Patient presents with  . Medicare Wellness    fu thyroid and wbc     HPI: Patient comes in today for Preventive Medicare wellness visit . And fu of medical issues  No major injuries, ed visits ,hospitalizations , new medications since last visit.  cortisone left knee dr Noemi Chapel  OA .   Thyroid :  Same med ok   Low wbc no sig infection "not even a cold this year"  To see dr Ouida Sills on prn basis  No new arthritis skin other autoimmunie sx.  On the look out for  autoimmunie sx and disease  .   LIPID high hdl want to avoid lipid med because of / liver risk   Feels pretty well  No new rashes bleeding   Health Maintenance  Topic Date Due  . Colonoscopy  01/29/2014  . Influenza Vaccine  04/30/2014  . Mammogram  04/21/2016  . Tetanus/tdap  02/03/2023  . Pneumococcal Polysaccharide Vaccine Age 62 And Over  Completed  . Zostavax  Completed   Health Maintenance Review LIFESTYLE:  Exercise:  Walks q day 30 minutes or more yoga some weights  Tobacco/ETS: no Alcohol: 2-3 wine per week.  Sugar beverages:no Sleep:  Good mostly 7-8  Drug use: no Bone density: ? When due  2010  -1.1 no fam hx  Colonoscopy: sched in September    Hearing:  Ok some decreased   Vision:  No limitations at present . Last eye check UTDcontacts   Safety:  Has smoke detector and wears seat belts.  No firearms. No excess sun exposure. Sees dentist regularly.  Falls: one  Advance directive :  Reviewed  Has one  Memory: Felt to be good  , no concern from her or her family.  Depression: No anhedonia unusual crying or depressive symptoms  Nutrition: Eats well balanced diet; adequate calcium and vitamin D. No swallowing chewing problems.  Injury: no major injuries in the last six months.  Other healthcare providers:  Reviewed  today .  Social:  Lives with spouse married. hh of 2  No pets.   Preventive parameters: up-to-date  Reviewed   ADLS:   There are no problems or need for assistance  driving, feeding, obtaining food, dressing, toileting and bathing, managing money using phone. She is independent.  ROS:  GEN/ HEENT: No fever, significant weight changes sweats headaches vision problems hearing changes, CV/ PULM; No chest pain shortness of breath cough, syncope,edema  change in exercise tolerance. GI /GU: No adominal pain, vomiting, change in bowel habits. No blood in the stool. No significant GU symptoms. SKIN/HEME: ,no acute skin rashes suspicious lesions or bleeding. No lymphadenopathy, nodules, masses.  NEURO/ PSYCH:  No neurologic signs such as weakness numbness. No depression anxiety. IMM/ Allergy: No unusual infections.  Allergy .   REST of 12 system review negative except as per HPI  Past Medical History  Diagnosis Date  . Kidney stones   . UTI (lower urinary tract infection)   . Hyperlipidemia     HDL over 100  . Hypothyroidism   . Arthritis     left knee  . Leukopenia     with neg hemevaluation bm bx get wbc diff q 6 months  . 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 Diplopia 2002  eval neg    Family History  Problem Relation Age of Onset  . Other Father     major renal failure; SCCA in ear  . Heart disease Mother     50  . Osteoarthritis Father   . Autoimmune disease Sister   . Autoimmune disease Brother   . Thyroid disease    . Stroke    . Osteoporosis      History   Social History  . Marital Status: Married    Spouse Name: N/A    Number of Children: N/A  . Years of Education: N/A   Social History Main Topics  . Smoking status: Never Smoker   . Smokeless tobacco: None  . Alcohol Use: 0.0 oz/week    5-7 Glasses of wine per week  . Drug Use: No  . Sexual Activity:    Other Topics Concern  . None   Social History  Narrative   Married   Retired  2008   Regular exercise-yes   [place] of 2   No pets   Social etoh   G2P2      Husband hac CABG 2012   Travel grandchildren     Outpatient Encounter Prescriptions as of 05/02/2014  Medication Sig  . aspirin 81 MG tablet Take 81 mg by mouth daily.    . calcium-vitamin D (OSCAL WITH D) 500-200 MG-UNIT per tablet Take 1 tablet by mouth daily.    . fish oil-omega-3 fatty acids 1000 MG capsule Take 1 g by mouth daily.   Marland Kitchen levothyroxine (SYNTHROID, LEVOTHROID) 88 MCG tablet TAKE 1 TABLET EVERY DAY  . [DISCONTINUED] Multiple Vitamin (MULTI-VITAMIN DAILY PO) Take by mouth. Senior Vitamin    EXAM:  BP 120/70  Temp(Src) 98.6 F (37 C) (Oral)  Ht 5\' 5"  (1.651 m)  Wt 136 lb (61.689 kg)  BMI 22.63 kg/m2  Body mass index is 22.63 kg/(m^2).  Physical Exam: Vital signs reviewed VHQ:IONG is a well-developed well-nourished alert cooperative   who appears stated age in no acute distress.  HEENT: normocephalic atraumatic , Eyes: PERRL EOM's full, conjunctiva clear, Nares: paten,t no deformity discharge or tenderness., Ears: no deformity EAC's clear TMs with normal landmarks. Mouth: clear OP, no lesions, edema.  Moist mucous membranes. Dentition in adequate repair. NECK: supple without masses, thyromegaly or bruits. Breast: normal by inspection . No dimpling, discharge, masses, tenderness or discharge . CHEST/PULM:  Clear to auscultation and percussion breath sounds equal no wheeze , rales or rhonchi. No chest wall deformities or tenderness. CV: PMI is nondisplaced, S1 S2 no gallops, murmurs, rubs. Peripheral pulses are full without delay.No JVD .  ABDOMEN: Bowel sounds normal nontender  No guard or rebound, no hepato splenomegal no CVA tenderness.  No hernia. Extremtities:  No clubbing cyanosis or edema, no acute joint swelling or redness no focal atrophy NEURO:  Oriented x3, cranial nerves 3-12 appear to be intact, no obvious focal weakness,gait within normal limits no  abnormal reflexes or asymmetrical SKIN: No acute rashes normal turgor, color, no bruising or petechiae. PSYCH: Oriented, good eye contact, no obvious depression anxiety, cognition and judgment appear normal. LN: no cervical axillary inguinal adenopathy No noted deficits in memory, attention, and speech.  ASSESSMENT AND PLAN:  Discussed the following assessment and plan:  Medicare annual wellness visit, subsequent  Leukopenia - following neg BMbx in past  - Plan: CBC with Differential, Basic metabolic panel, Hepatic function panel, Lipid panel, TSH  HYPOTHYROIDISM - check tsh today  - Plan: CBC with Differential,  Basic metabolic panel, Hepatic function panel, Lipid panel, TSH  Other and unspecified hyperlipidemia - lsi no premature vasc cdisease althouth  mom had CD in her 68 s - Plan: CBC with Differential, Basic metabolic panel, Hepatic function panel, Lipid panel, TSH  Nonspecific abnormal results of liver function study - neg liver bx in past pos autoimmunie markers   Family history of autoimmune disorder Copy of labs to dr Ouida Sills Following as per advised specialists  high risk for autoimmune other disease  Patient Care Team: Burnis Medin, MD as PCP - General Gatha Mayer, MD (Gastroenterology) Lorn Junes, MD (Orthopedic Surgery) Thyra Breed, MD (Internal Medicine) Jerene Bears, MD as Consulting Physician (Gastroenterology)  Patient Instructions  Will notify you  of labs when available. Check bp to ensure normal out of office  Continue lifestyle intervention healthy eating and exercise . Healthy lifestyle includes : At least 150 minutes of exercise weeks  , weight at healthy levels, which is usually   BMI 19-25. Avoid trans fats and processed foods;  Increase fresh fruits and veges to 5 servings per day. And avoid sweet beverages including tea and juice. Mediterranean diet with olive oil and nuts have been noted to be heart and brain healthy . Avoid  tobacco products . Limit  alcohol to  7 per week for women and 14 servings for men.  Get adequate sleep .  Plan to Get bone density next  year.   Standley Brooking. Panosh M.D.

## 2014-05-17 ENCOUNTER — Ambulatory Visit (AMBULATORY_SURGERY_CENTER): Payer: Self-pay

## 2014-05-17 VITALS — Ht 65.0 in | Wt 139.0 lb

## 2014-05-17 DIAGNOSIS — Z1211 Encounter for screening for malignant neoplasm of colon: Secondary | ICD-10-CM

## 2014-05-17 MED ORDER — MOVIPREP 100 G PO SOLR: ORAL | Status: DC

## 2014-05-17 NOTE — Progress Notes (Signed)
Pt came into the office today for her PV prior to her colon with Dr Hilarie Fredrickson on 06/02/14.Informed pt we would need to reschedule her colon with Dr Carlean Purl, instead of Dr Hilarie Fredrickson, but she states she has never seen Dr Carlean Purl before. Per pt her last colon was done by Dr Sharlett Iles over 10 years ago. Per scheduler, the colon was read by Dr Carlean Purl and it was OK to keep pt with Dr Hilarie Fredrickson on 06/02/14.     Per pt, no allergies to soy or egg products.Pt not taking any weight loss meds or using  O2 at home.

## 2014-05-26 ENCOUNTER — Other Ambulatory Visit: Payer: Self-pay | Admitting: Internal Medicine

## 2014-05-26 NOTE — Telephone Encounter (Signed)
Sent to the pharmacy by e-scribe. 

## 2014-05-27 ENCOUNTER — Other Ambulatory Visit: Payer: Self-pay | Admitting: Internal Medicine

## 2014-05-27 NOTE — Telephone Encounter (Signed)
Denied.  Filled for 1 year on 05/26/14

## 2014-05-31 ENCOUNTER — Encounter: Payer: Medicare Other | Admitting: Internal Medicine

## 2014-06-02 ENCOUNTER — Encounter: Payer: Medicare Other | Admitting: Internal Medicine

## 2014-06-02 ENCOUNTER — Encounter: Payer: Self-pay | Admitting: Internal Medicine

## 2014-06-02 ENCOUNTER — Ambulatory Visit (AMBULATORY_SURGERY_CENTER): Payer: Medicare Other | Admitting: Internal Medicine

## 2014-06-02 VITALS — BP 137/78 | HR 60 | Temp 97.5°F | Resp 15 | Ht 65.0 in | Wt 139.0 lb

## 2014-06-02 DIAGNOSIS — Z1211 Encounter for screening for malignant neoplasm of colon: Secondary | ICD-10-CM

## 2014-06-02 MED ORDER — SODIUM CHLORIDE 0.9 % IV SOLN: 500.0000 mL | INTRAVENOUS | Status: DC

## 2014-06-02 NOTE — Progress Notes (Signed)
A/ox3 pleased with MAC, report to Jane RN 

## 2014-06-02 NOTE — Patient Instructions (Signed)
YOU HAD AN ENDOSCOPIC PROCEDURE TODAY AT THE [facility]: Refer to the procedure report that was given to you for any specific questions about what was found during the examination.  If the procedure report does not answer your questions, please call your gastroenterologist to clarify.  If you requested that your care partner not be given the details of your procedure findings, then the procedure report has been included in a sealed envelope for you to review at your convenience later.  YOU SHOULD EXPECT: Some feelings of bloating in the abdomen. Passage of more gas than usual.  Walking can help get rid of the air that was put into your GI tract during the procedure and reduce the bloating. If you had a lower endoscopy (such as a colonoscopy or flexible sigmoidoscopy) you may notice spotting of blood in your stool or on the toilet paper. If you underwent a bowel prep for your procedure, then you may not have a normal bowel movement for a few days.  DIET: Your first meal following the procedure should be a light meal and then it is ok to progress to your normal diet.  A half-sandwich or bowl of soup is an example of a good first meal.  Heavy or fried foods are harder to digest and may make you feel nauseous or bloated.  Likewise meals heavy in dairy and vegetables can cause extra gas to form and this can also increase the bloating.  Drink plenty of fluids but you should avoid alcoholic beverages for 24 hours.  ACTIVITY: Your care partner should take you home directly after the procedure.  You should plan to take it easy, moving slowly for the rest of the day.  You can resume normal activity the day after the procedure however you should NOT DRIVE or use heavy machinery for 24 hours (because of the sedation medicines used during the test).    SYMPTOMS TO REPORT IMMEDIATELY: A gastroenterologist can be reached at any hour.  During normal business hours, 8:30 AM to 5:00 PM Monday through Friday,  call [phone].  After hours and on weekends, please call the GI answering service at [phone] who will take a message and have the physician on call contact you.   Following lower endoscopy (colonoscopy or flexible sigmoidoscopy):  Excessive amounts of blood in the stool  Significant tenderness or worsening of abdominal pains  Swelling of the abdomen that is new, acute  Fever of 100F or higher    FOLLOW UP: If any biopsies were taken you will be contacted by phone or by letter within the next 1-3 weeks.  Call your gastroenterologist if you have not heard about the biopsies in 3 weeks.  Our staff will call the home number listed on your records the next business day following your procedure to check on you and address any questions or concerns that you may have at that time regarding the information given to you following your procedure. This is a courtesy call and so if there is no answer at the home number and we have not heard from you through the emergency physician on call, we will assume that you have returned to your regular daily activities without incident.  SIGNATURES/CONFIDENTIALITY: You and/or your care partner have signed paperwork which will be entered into your electronic medical record.  These signatures attest to the fact that that the information above on your After Visit Summary has been reviewed and is understood.  Full responsibility of the confidentiality   of this discharge information lies with you and/or your care-partner.  Diverticulosis and high fiber diet information given. Repeat colonoscopy in 10 years-2025.

## 2014-06-02 NOTE — Op Note (Signed)
[facility]  [address] [place], [address]   COLONOSCOPY PROCEDURE REPORT  PATIENT: Crystal, Odom  MR#: [id number] BIRTHDATE: 03/06/1941 , 72  yrs. old GENDER: Female ENDOSCOPIST: Jerene Bears, MD PROCEDURE DATE:  06/02/2014 PROCEDURE:   Colonoscopy, screening First Screening Colonoscopy - Avg.  risk and is 50 yrs.  old or older - No.  Prior Negative Screening - Now for repeat screening. 10 or more years since last screening  History of Adenoma - Now for follow-up colonoscopy & has been > or = to 3 yrs.  N/A  Polyps Removed Today? No.  Recommend repeat exam, <10 yrs? No. ASA CLASS:   Class II INDICATIONS:average risk screening and Last colonoscopy performed 10 years ago. MEDICATIONS: MAC sedation, administered by CRNA and propofol (Diprivan) 200mg  IV  DESCRIPTION OF PROCEDURE:   After the risks benefits and alternatives of the procedure were thoroughly explained, informed consent was obtained.  A digital rectal exam revealed no rectal mass.   The LB [id number]  endoscope was introduced through the anus and advanced to the cecum, which was identified by both the appendix and ileocecal valve. No adverse events experienced. The quality of the prep was good, using MoviPrep  The instrument was then slowly withdrawn as the colon was fully examined.    COLON FINDINGS: Mild diverticulosis was noted in the sigmoid colon. The colon mucosa was otherwise normal.  Retroflexed views revealed internal hemorrhoids. The time to cecum=4 minutes 20 seconds. Withdrawal time=7 minutes 58 seconds.  The scope was withdrawn and the procedure completed.  COMPLICATIONS: There were no complications.  ENDOSCOPIC IMPRESSION: 1.   Mild diverticulosis was noted in the sigmoid colon 2.   The colon mucosa was otherwise normal  RECOMMENDATIONS: You should continue to follow colorectal cancer screening guidelines for "routine risk" patients with a repeat colonoscopy in  10 years. There is no need for FOBT (stool) testing for at least 5 years.   eSigned:  Jerene Bears, MD 06/02/2014 9:31 AM   cc: The Patient and Burnis Medin, MD

## 2014-06-03 ENCOUNTER — Telehealth: Payer: Self-pay | Admitting: *Deleted

## 2014-06-03 NOTE — Telephone Encounter (Signed)
  Follow up Call-  Call back number 06/02/2014  Post procedure Call Back phone  # [phone]  Permission to leave phone message Yes     Patient questions:  Message left to call us if necessary.

## 2014-06-28 ENCOUNTER — Telehealth: Payer: Self-pay | Admitting: Internal Medicine

## 2014-06-28 MED ORDER — LEVOTHYROXINE SODIUM 88 MCG PO TABS: ORAL_TABLET | ORAL | Status: DC

## 2014-06-28 NOTE — Telephone Encounter (Signed)
Filled for 1 year on 05/26/14.  This may have been sent in error.

## 2014-06-28 NOTE — Telephone Encounter (Signed)
They are requesting it as a 90 day re-fill instead.

## 2014-06-28 NOTE — Telephone Encounter (Signed)
[id number] - [place], [place] [address] is requesting 90 day re-fill on levothyroxine (SYNTHROID, LEVOTHROID) 88 MCG tablet

## 2014-06-28 NOTE — Telephone Encounter (Signed)
Resent to the pharmacy for 1 year as a 90 day supply.

## 2014-07-13 ENCOUNTER — Ambulatory Visit (INDEPENDENT_AMBULATORY_CARE_PROVIDER_SITE_OTHER): Payer: Medicare Other | Admitting: Family Medicine

## 2014-07-13 DIAGNOSIS — Z23 Encounter for immunization: Secondary | ICD-10-CM

## 2015-04-17 ENCOUNTER — Other Ambulatory Visit: Payer: Self-pay

## 2015-04-17 DIAGNOSIS — Z1231 Encounter for screening mammogram for malignant neoplasm of breast: Secondary | ICD-10-CM

## 2015-04-20 ENCOUNTER — Other Ambulatory Visit (INDEPENDENT_AMBULATORY_CARE_PROVIDER_SITE_OTHER): Payer: Medicare Other

## 2015-04-20 DIAGNOSIS — Z0000 Encounter for general adult medical examination without abnormal findings: Secondary | ICD-10-CM | POA: Diagnosis not present

## 2015-04-20 LAB — TSH: TSH: 0.83 u[IU]/mL (ref 0.35–4.50)

## 2015-04-20 LAB — HEPATIC FUNCTION PANEL
ALT: 27 U/L (ref 0–35)
AST: 29 U/L (ref 0–37)
Albumin: 4 g/dL (ref 3.5–5.2)
Alkaline Phosphatase: 83 U/L (ref 39–117)
BILIRUBIN DIRECT: 0.1 mg/dL (ref 0.0–0.3)
BILIRUBIN TOTAL: 0.6 mg/dL (ref 0.2–1.2)
TOTAL PROTEIN: 7 g/dL (ref 6.0–8.3)

## 2015-04-20 LAB — CBC WITH DIFFERENTIAL/PLATELET
BASOS ABS: 0 10*3/uL (ref 0.0–0.1)
Basophils Relative: 0.8 % (ref 0.0–3.0)
Eosinophils Absolute: 0.1 10*3/uL (ref 0.0–0.7)
Eosinophils Relative: 3.1 % (ref 0.0–5.0)
HEMATOCRIT: 41 % (ref 36.0–46.0)
Hemoglobin: 13.9 g/dL (ref 12.0–15.0)
Lymphocytes Relative: 33.1 % (ref 12.0–46.0)
Lymphs Abs: 1.1 10*3/uL (ref 0.7–4.0)
MCHC: 33.8 g/dL (ref 30.0–36.0)
MCV: 92.9 fl (ref 78.0–100.0)
Monocytes Absolute: 0.4 10*3/uL (ref 0.1–1.0)
Monocytes Relative: 11.4 % (ref 3.0–12.0)
Neutro Abs: 1.7 10*3/uL (ref 1.4–7.7)
Neutrophils Relative %: 51.6 % (ref 43.0–77.0)
Platelets: 244 10*3/uL (ref 150.0–400.0)
RBC: 4.42 Mil/uL (ref 3.87–5.11)
RDW: 14.7 % (ref 11.5–15.5)
WBC: 3.3 10*3/uL — ABNORMAL LOW (ref 4.0–10.5)

## 2015-04-20 LAB — BASIC METABOLIC PANEL
BUN: 20 mg/dL (ref 6–23)
CALCIUM: 9.6 mg/dL (ref 8.4–10.5)
CHLORIDE: 105 meq/L (ref 96–112)
CO2: 29 mEq/L (ref 19–32)
Creatinine, Ser: 0.63 mg/dL (ref 0.40–1.20)
GFR: 98.76 mL/min (ref >60.00)
Glucose, Bld: 98 mg/dL (ref 70–99)
Potassium: 4.5 mEq/L (ref 3.5–5.1)
Sodium: 141 mEq/L (ref 135–145)

## 2015-04-20 LAB — LIPID PANEL
CHOL/HDL RATIO: 2
CHOLESTEROL: 271 mg/dL — AB (ref 0–200)
HDL: 111.3 mg/dL (ref >39.00)
LDL CALC: 151 mg/dL — AB (ref 0–99)
NONHDL: 159.7
Triglycerides: 46 mg/dL (ref 0.0–149.0)
VLDL: 9.2 mg/dL (ref 0.0–40.0)

## 2015-05-03 ENCOUNTER — Encounter: Payer: Self-pay | Admitting: Internal Medicine

## 2015-05-03 ENCOUNTER — Ambulatory Visit (INDEPENDENT_AMBULATORY_CARE_PROVIDER_SITE_OTHER): Payer: Medicare Other | Admitting: Internal Medicine

## 2015-05-03 VITALS — BP 142/80 | Temp 98.5°F | Ht 65.0 in | Wt 136.0 lb

## 2015-05-03 DIAGNOSIS — E785 Hyperlipidemia, unspecified: Secondary | ICD-10-CM

## 2015-05-03 DIAGNOSIS — D72819 Decreased white blood cell count, unspecified: Secondary | ICD-10-CM | POA: Diagnosis not present

## 2015-05-03 DIAGNOSIS — R030 Elevated blood-pressure reading, without diagnosis of hypertension: Secondary | ICD-10-CM

## 2015-05-03 DIAGNOSIS — IMO0001 Reserved for inherently not codable concepts without codable children: Secondary | ICD-10-CM

## 2015-05-03 DIAGNOSIS — E039 Hypothyroidism, unspecified: Secondary | ICD-10-CM

## 2015-05-03 DIAGNOSIS — Z0000 Encounter for general adult medical examination without abnormal findings: Secondary | ICD-10-CM

## 2015-05-03 DIAGNOSIS — E2839 Other primary ovarian failure: Secondary | ICD-10-CM

## 2015-05-03 NOTE — Progress Notes (Signed)
Pre visit review using our clinic review tool, if applicable. No additional management support is needed unless otherwise documented below in the visit note.  Chief Complaint  Patient presents with  . Medicare Wellness    HPI: Crystal Odom 74 y.o. comes in today for Preventive Medicare wellness visit .    lab monitoring  Leukopenia high risk for autoimmunie disease     Doing well stopped calcium vit D.   Usual  bp 120   When donates blood .   thyroid same med no change no weigh t change new rash edema  Some dec hearing  Ok family hx  Health Maintenance  Topic Date Due  . DEXA SCAN  06/06/2008  . INFLUENZA VACCINE  05/01/2015  . MAMMOGRAM  04/21/2016  . TETANUS/TDAP  02/03/2023  . COLONOSCOPY  06/02/2024  . ZOSTAVAX  Completed  . PNA vac Low Risk Adult  Completed   Health Maintenance Review LIFESTYLE:  Exercise:  Walks every day  yga Tobacco/ETS: Alcohol: per day less than once a day b Sugar beverages:no Sleep:  8 hours  Drug use: no :  MEDICARE DOCUMENT QUESTIONS  TO SCAN  Hearing:  May be going down some   Vision:  No limitations at present . Last eye check UTD  Safety:  Has smoke detector and wears seat belts.  No firearms. No excess sun exposure. Sees dentist regularly.  Falls: oneon shoe laces   Advance directive :  Reviewed  Has one.  Memory: Felt to be good  , no concern from her or her family.  Depression: No anhedonia unusual crying or depressive symptoms  Nutrition: Eats well balanced diet; adequate calcium and vitamin D. No swallowing chewing problems.  Injury: no major injuries in the last six months.  Other healthcare providers:  Reviewed today .  Social:  Lives with spouse married. No pets.   Preventive parameters: up-to-date  Reviewed   ADLS:   There are no problems or need for assistance  driving, feeding, obtaining food, dressing, toileting and bathing, managing money using phone. She is independent.  ROS:  GEN/ HEENT: No  fever, significant weight changes sweats headaches vision problems hearing changes, CV/ PULM; No chest pain shortness of breath cough, syncope,edema  change in exercise tolerance. GI /GU: No adominal pain, vomiting, change in bowel habits. No blood in the stool. No significant GU symptoms. SKIN/HEME: ,no acute skin rashes suspicious lesions or bleeding. No lymphadenopathy, nodules, masses.  NEURO/ PSYCH:  No neurologic signs such as weakness numbness. No depression anxiety. IMM/ Allergy: No unusual infections.  Allergy .   REST of 12 system review negative except as per HPI   Past Medical History  Diagnosis Date  . Kidney stones     in past  . UTI (lower urinary tract infection)   . Hyperlipidemia     HDL over 100  . Hypothyroidism   . Arthritis     left knee  . Leukopenia     with neg hemevaluation bm bx get wbc diff q 6 months  . 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 Diplopia 2002    eval neg    Family History  Problem Relation Age of Onset  . Other Father     major renal failure; SCCA in ear  . Osteoarthritis Father   . Heart disease Father   . Heart disease Mother     67  . Autoimmune disease Sister   .  Autoimmune disease Brother   . Thyroid disease    . Stroke    . Osteoporosis    . Stroke Maternal Grandmother     History   Social History  . Marital Status: Married    Spouse Name: N/A  . Number of Children: N/A  . Years of Education: N/A   Social History Main Topics  . Smoking status: Never Smoker   . Smokeless tobacco: Never Used  . Alcohol Use: 1.2 - 1.8 oz/week    2-3 Glasses of wine per week  . Drug Use: No  . Sexual Activity: Not on file   Other Topics Concern  . None   Social History Narrative   Married   Retired  2008   Regular exercise-yes   [place] of 2   No pets   Social etoh   G2P2      Husband hac CABG 2012   Travel grandchildren     Outpatient Encounter Prescriptions as of 05/03/2015   Medication Sig  . aspirin 81 MG tablet Take 81 mg by mouth daily.    . fish oil-omega-3 fatty acids 1000 MG capsule Take 1 g by mouth daily.   Marland Kitchen levothyroxine (SYNTHROID, LEVOTHROID) 88 MCG tablet TAKE 1 TABLET EVERY DAY  . calcium-vitamin D (OSCAL WITH D) 500-200 MG-UNIT per tablet Take 1 tablet by mouth daily. Take calcium 600 mg and vit- d 800 units daily   No facility-administered encounter medications on file as of 05/03/2015.    EXAM:  BP 142/80 mmHg  Temp(Src) 98.5 F (36.9 C) (Oral)  Ht 5\' 5"  (1.651 m)  Wt 136 lb (61.689 kg)  BMI 22.63 kg/m2  Body mass index is 22.63 kg/(m^2).  Physical Exam: Vital signs reviewed AST:MHDQ is a well-developed well-nourished alert cooperative   who appears stated age in no acute distress.  HEENT: normocephalic atraumatic , Eyes: PERRL EOM's full, conjunctiva clear, Nares: paten,t no deformity discharge or tenderness., Ears: no deformity EAC's clear TMs with normal landmarks. Mouth: clear OP, no lesions, edema.  Moist mucous membranes. Dentition in adequate repair. NECK: supple without masses, thyromegaly or bruits. CHEST/PULM:  Clear to auscultation and percussion breath sounds equal no wheeze , rales or rhonchi. No chest wall deformities or tenderness.Breast: normal by inspection . No dimpling, discharge, masses, tenderness or discharge . CV: PMI is nondisplaced, S1 S2 no gallops, murmurs, rubs. Peripheral pulses are full without delay.No JVD .  ABDOMEN: Bowel sounds normal nontender  No guard or rebound, no hepato splenomegal no CVA tenderness.  . Extremtities:  No clubbing cyanosis or edema, no acute joint swelling or redness no focal atrophy NEURO:  Oriented x3, cranial nerves 3-12 appear to be intact, no obvious focal weakness,gait within normal limits no abnormal reflexes or asymmetrical SKIN: No acute rashes normal turgor, color, no bruising or petechiae. PSYCH: Oriented, good eye contact, no obvious depression anxiety, cognition and  judgment appear normal. LN: no cervical axillary inguinal adenopathy No noted deficits in memory, attention, and speech.   Lab Results  Component Value Date   WBC 3.3* 04/20/2015   HGB 13.9 04/20/2015   HCT 41.0 04/20/2015   PLT 244.0 04/20/2015   GLUCOSE 98 04/20/2015   CHOL 271* 04/20/2015   TRIG 46.0 04/20/2015   HDL 111.30 04/20/2015   LDLDIRECT 149.6 02/02/2013   LDLCALC 151* 04/20/2015   ALT 27 04/20/2015   AST 29 04/20/2015   NA 141 04/20/2015   K 4.5 04/20/2015   CL 105 04/20/2015   CREATININE 0.63  04/20/2015   BUN 20 04/20/2015   CO2 29 04/20/2015   TSH 0.83 04/20/2015   INR 0.91 11/10/2009    ASSESSMENT AND PLAN:  Discussed the following assessment and plan:  Visit for preventive health examination  Medicare annual wellness visit, subsequent  Leukopenia - hx val  stable oss autoimmune   Hypothyroidism, unspecified hypothyroidism type - tsh in range - Plan: DG Bone Density  Hyperlipidemia - disc risk benefit of med fam hx cadmom in 70s over  weight and other risk consdier cacscore  Elevated BP - readings  but ok at home  will check to ensure control   Estrogen deficiency - due for dexa  - Plan: DG Bone Density  Patient Care Team: Burnis Medin, MD as PCP - General Elsie Saas, MD (Orthopedic Surgery) Unice Bailey, MD (Internal Medicine) Jerene Bears, MD as Consulting Physician (Gastroenterology) Jerene Bears, MD as Consulting Physician (Gastroenterology) Marica Otter, OD (Optometry)  Patient Instructions  Continue lifestyle intervention healthy eating and exercise . Healthy lifestyle includes : At least 150 minutes of exercise weeks  , weight at healthy levels, which is usually   BMI 19-25. Avoid trans fats and processed foods;  Increase fresh fruits and veges to 5 servings per day. And avoid sweet beverages including tea and juice. Mediterranean diet with olive oil and nuts have been noted to be heart and brain healthy . Avoid tobacco  products . Limit  alcohol to  7 per week for women and 14 servings for men.  Get adequate sleep . Wear seat belts . Don't text and drive .   Yearly fluvaccine .  ascvd 10 year risk  Is 9.3%  Can consider statin medication   ( 7.5 is cut off for no statin benefit)     Mariann Laster K. Shiela Bruns M.D.

## 2015-05-03 NOTE — Patient Instructions (Addendum)
Continue lifestyle intervention healthy eating and exercise . Healthy lifestyle includes : At least 150 minutes of exercise weeks  , weight at healthy levels, which is usually   BMI 19-25. Avoid trans fats and processed foods;  Increase fresh fruits and veges to 5 servings per day. And avoid sweet beverages including tea and juice. Mediterranean diet with olive oil and nuts have been noted to be heart and brain healthy . Avoid tobacco products . Limit  alcohol to  7 per week for women and 14 servings for men.  Get adequate sleep . Wear seat belts . Don't text and drive .   Yearly fluvaccine .  ascvd 10 year risk  Is 9.3%  Can consider statin medication   ( 7.5 is cut off for no statin benefit)

## 2015-05-04 ENCOUNTER — Ambulatory Visit (INDEPENDENT_AMBULATORY_CARE_PROVIDER_SITE_OTHER)
Admission: RE | Admit: 2015-05-04 | Discharge: 2015-05-04 | Disposition: A | Discharge status: Home / Self Care | Payer: Medicare Other | Source: Ambulatory Visit | Attending: Internal Medicine | Admitting: Internal Medicine

## 2015-05-04 ENCOUNTER — Ambulatory Visit
Admission: RE | Admit: 2015-05-04 | Discharge: 2015-05-04 | Disposition: A | Discharge status: Home / Self Care | Payer: Medicare Other | Source: Ambulatory Visit

## 2015-05-04 DIAGNOSIS — E2839 Other primary ovarian failure: Secondary | ICD-10-CM

## 2015-05-04 DIAGNOSIS — E039 Hypothyroidism, unspecified: Secondary | ICD-10-CM | POA: Diagnosis not present

## 2015-05-04 DIAGNOSIS — Z1231 Encounter for screening mammogram for malignant neoplasm of breast: Secondary | ICD-10-CM

## 2015-05-16 ENCOUNTER — Other Ambulatory Visit: Payer: Self-pay | Admitting: Internal Medicine

## 2015-05-17 NOTE — Telephone Encounter (Signed)
Sent to the pharmacy by e-scribe. 

## 2015-12-25 ENCOUNTER — Other Ambulatory Visit: Payer: Self-pay | Admitting: Internal Medicine

## 2016-01-01 DIAGNOSIS — M25561 Pain in right knee: Secondary | ICD-10-CM | POA: Diagnosis not present

## 2016-01-04 DIAGNOSIS — M25561 Pain in right knee: Secondary | ICD-10-CM | POA: Diagnosis not present

## 2016-01-08 DIAGNOSIS — S83241A Other tear of medial meniscus, current injury, right knee, initial encounter: Secondary | ICD-10-CM | POA: Diagnosis not present

## 2016-01-08 DIAGNOSIS — S83281A Other tear of lateral meniscus, current injury, right knee, initial encounter: Secondary | ICD-10-CM | POA: Diagnosis not present

## 2016-02-02 DIAGNOSIS — H1850 Unspecified hereditary corneal dystrophies: Secondary | ICD-10-CM | POA: Diagnosis not present

## 2016-02-02 DIAGNOSIS — H524 Presbyopia: Secondary | ICD-10-CM | POA: Diagnosis not present

## 2016-02-02 DIAGNOSIS — H52223 Regular astigmatism, bilateral: Secondary | ICD-10-CM | POA: Diagnosis not present

## 2016-02-02 DIAGNOSIS — H25813 Combined forms of age-related cataract, bilateral: Secondary | ICD-10-CM | POA: Diagnosis not present

## 2016-02-02 DIAGNOSIS — H5203 Hypermetropia, bilateral: Secondary | ICD-10-CM | POA: Diagnosis not present

## 2016-04-12 ENCOUNTER — Other Ambulatory Visit: Payer: Self-pay | Admitting: Internal Medicine

## 2016-04-12 DIAGNOSIS — Z1231 Encounter for screening mammogram for malignant neoplasm of breast: Secondary | ICD-10-CM

## 2016-05-06 ENCOUNTER — Ambulatory Visit: Payer: Medicare Other

## 2016-05-09 ENCOUNTER — Other Ambulatory Visit (INDEPENDENT_AMBULATORY_CARE_PROVIDER_SITE_OTHER): Payer: PPO

## 2016-05-09 DIAGNOSIS — Z0000 Encounter for general adult medical examination without abnormal findings: Secondary | ICD-10-CM

## 2016-05-09 DIAGNOSIS — S83281D Other tear of lateral meniscus, current injury, right knee, subsequent encounter: Secondary | ICD-10-CM | POA: Diagnosis not present

## 2016-05-09 DIAGNOSIS — S83241D Other tear of medial meniscus, current injury, right knee, subsequent encounter: Secondary | ICD-10-CM | POA: Diagnosis not present

## 2016-05-09 LAB — HEPATIC FUNCTION PANEL
ALBUMIN: 4.1 g/dL (ref 3.5–5.2)
ALK PHOS: 77 U/L (ref 39–117)
ALT: 29 U/L (ref 0–35)
AST: 28 U/L (ref 0–37)
BILIRUBIN DIRECT: 0.1 mg/dL (ref 0.0–0.3)
TOTAL PROTEIN: 7.4 g/dL (ref 6.0–8.3)
Total Bilirubin: 0.7 mg/dL (ref 0.2–1.2)

## 2016-05-09 LAB — LIPID PANEL
CHOL/HDL RATIO: 3
Cholesterol: 291 mg/dL — ABNORMAL HIGH (ref 0–200)
HDL: 114.6 mg/dL (ref >39.00)
LDL Cholesterol: 167 mg/dL — ABNORMAL HIGH (ref 0–99)
NONHDL: 176.69
TRIGLYCERIDES: 47 mg/dL (ref 0.0–149.0)
VLDL: 9.4 mg/dL (ref 0.0–40.0)

## 2016-05-09 LAB — CBC WITH DIFFERENTIAL/PLATELET
BASOS PCT: 0.9 % (ref 0.0–3.0)
Basophils Absolute: 0 10*3/uL (ref 0.0–0.1)
EOS ABS: 0.1 10*3/uL (ref 0.0–0.7)
Eosinophils Relative: 4.5 % (ref 0.0–5.0)
HEMATOCRIT: 39.9 % (ref 36.0–46.0)
HEMOGLOBIN: 13.6 g/dL (ref 12.0–15.0)
LYMPHS PCT: 36.6 % (ref 12.0–46.0)
Lymphs Abs: 1 10*3/uL (ref 0.7–4.0)
MCHC: 34 g/dL (ref 30.0–36.0)
MCV: 95.1 fl (ref 78.0–100.0)
MONOS PCT: 11.5 % (ref 3.0–12.0)
Monocytes Absolute: 0.3 10*3/uL (ref 0.1–1.0)
Neutro Abs: 1.3 10*3/uL — ABNORMAL LOW (ref 1.4–7.7)
Neutrophils Relative %: 46.5 % (ref 43.0–77.0)
Platelets: 259 10*3/uL (ref 150.0–400.0)
RBC: 4.19 Mil/uL (ref 3.87–5.11)
RDW: 14.5 % (ref 11.5–15.5)
WBC: 2.8 10*3/uL — AB (ref 4.0–10.5)

## 2016-05-09 LAB — TSH: TSH: 2.32 u[IU]/mL (ref 0.35–4.50)

## 2016-05-09 LAB — BASIC METABOLIC PANEL
BUN: 20 mg/dL (ref 6–23)
CHLORIDE: 104 meq/L (ref 96–112)
CO2: 29 meq/L (ref 19–32)
CREATININE: 0.62 mg/dL (ref 0.40–1.20)
Calcium: 9.6 mg/dL (ref 8.4–10.5)
GFR: 100.31 mL/min (ref >60.00)
Glucose, Bld: 80 mg/dL (ref 70–99)
POTASSIUM: 4.4 meq/L (ref 3.5–5.1)
SODIUM: 140 meq/L (ref 135–145)

## 2016-05-13 NOTE — Progress Notes (Signed)
Chief Complaint  Patient presents with  . Medicare Wellness    HPI: Crystal Odom 75 y.o. comes in today for Preventive Medicare  [preventive .Since last visit. Exam and med eval   Dr Noemi Chapel    Torn meniscus.  thryoid med no se noted  No fever   cpsoc feels pretty good   Traveling soon  BP readings at home are better has monitor today  dioes dash diet  118 at blood center  Other areas  No infection  Fevers feels pretty well    Health Maintenance  Topic Date Due  . INFLUENZA VACCINE  04/30/2016  . MAMMOGRAM  05/03/2017  . TETANUS/TDAP  02/03/2023  . COLONOSCOPY  06/02/2024  . DEXA SCAN  Completed  . ZOSTAVAX  Completed  . PNA vac Low Risk Adult  Completed   Health Maintenance Review LIFESTYLE:  TAD 2-3 week  Sugar beverages:  no Sleep: 7-9   Exercise and yoga    MEDICARE DOCUMENT QUESTIONS  TO SCAN   Hearing:  ok  Vision:  No limitations at present . Last eye check UTD  Safety:  Has smoke detector and wears seat belts.  No firearms. No excess sun exposure. Sees dentist regularly.  Falls: n  Except chasing   [place] child  Advance directive :  Reviewed  Has one.  Memory: Felt to be good  , no concern from her or her family.  Depression: No anhedonia unusual crying or depressive symptoms  Nutrition: Eats well balanced diet; adequate calcium and vitamin D. No swallowing chewing problems.  Injury: no major injuries in the last six months.  Other healthcare providers:  Reviewed today .  Social:  Lives with spouse married. No pets.   Preventive parameters: up-to-date  Reviewed   ADLS:   There are no problems or need for assistance  driving, feeding, obtaining food, dressing, toileting and bathing, managing money using phone. She is independent.   ROS:  GEN/ HEENT: No fever, significant weight changes sweats headaches vision problems hearing changes, CV/ PULM; No chest pain shortness of breath cough, syncope,edema  change in exercise tolerance. GI /GU:  No adominal pain, vomiting, change in bowel habits. No blood in the stool. No significant GU symptoms. SKIN/HEME: ,no acute skin rashes suspicious lesions or bleeding. No lymphadenopathy, nodules, masses.  NEURO/ PSYCH:  No neurologic signs such as weakness numbness. No depression anxiety. IMM/ Allergy: No unusual infections.  Allergy .   REST of 12 system review negative except as per HPI   Past Medical History:  Diagnosis Date  . 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 Arthritis    left knee  . Diplopia 2002   eval neg  . Hyperlipidemia    HDL over 100  . Hypothyroidism   . Kidney stones    in past  . Leukopenia    with neg hemevaluation bm bx get wbc diff q 6 months  . UTI (lower urinary tract infection)     Family History  Problem Relation Age of Onset  . Other Father     major renal failure; SCCA in ear  . Osteoarthritis Father   . Heart disease Father   . Heart disease Mother     19  . Autoimmune disease Sister   . Autoimmune disease Brother   . Thyroid disease    . Stroke    . Osteoporosis    . Stroke Maternal Grandmother  Social History   Social History  . Marital status: Married    Spouse name: N/A  . Number of children: N/A  . Years of education: N/A   Social History Main Topics  . Smoking status: Never Smoker  . Smokeless tobacco: Never Used  . Alcohol use 1.2 - 1.8 oz/week    2 - 3 Glasses of wine per week  . Drug use: No  . Sexual activity: Not Asked   Other Topics Concern  . None   Social History Narrative   Married   Retired  2008   Regular exercise-yes   [place]   No pets   Social etoh   G2P2      Husband hac CABG 2012   Travel grandchildren     Outpatient Encounter Prescriptions as of 05/14/2016  Medication Sig  . aspirin 81 MG tablet Take 81 mg by mouth daily.    . fish oil-omega-3 fatty acids 1000 MG capsule Take 1 g by mouth daily.   Marland Kitchen levothyroxine (SYNTHROID, LEVOTHROID)  88 MCG tablet TAKE 1 TABLET BY MOUTH ONCE DAILY  . [DISCONTINUED] levothyroxine (SYNTHROID, LEVOTHROID) 88 MCG tablet TAKE 1 TABLET EVERY DAY  . [DISCONTINUED] calcium-vitamin D (OSCAL WITH D) 500-200 MG-UNIT per tablet Take 1 tablet by mouth daily. Take calcium 600 mg and vit- d 800 units daily   No facility-administered encounter medications on file as of 05/14/2016.     EXAM:  BP 140/78 Comment: compared outs and her machine  ok  Temp 98.3 F (36.8 C) (Oral)   Ht _0  (1.651 m)   Wt 127 lb 3.2 oz (57.7 kg)   BMI 21.17 kg/m   Body mass index is 21.17 kg/m.  Physical Exam: Vital signs reviewed VZD:GLOV is a well-developed well-nourished alert cooperative   who appears stated age in no acute distress.  HEENT: normocephalic atraumatic , Eyes: PERRL EOM's full, conjunctiva clear, Nares: paten,t no deformity discharge or tenderness., Ears: no deformity EAC's clear TMs with normal landmarks. Mouth: clear OP, no lesions, edema.  Moist mucous membranes. Dentition in adequate repair. NECK: supple without masses, thyromegaly or bruits. CHEST/PULM:  Clear to auscultation and percussion breath sounds equal no wheeze , rales or rhonchi. No chest wall deformities or tenderness. CV: PMI is nondisplaced, S1 S2 no gallops, murmurs, rubs. Peripheral pulses are full without delay.No JVD . Breast: normal by inspection . No dimpling, discharge, masses, tenderness or discharge . ABDOMEN: Bowel sounds normal nontender  No guard or rebound, no hepato splenomegal no CVA tenderness.   Extremtities:  No clubbing cyanosis or edema, no acute joint swelling or redness no focal atrophy NEURO:  Oriented x3, cranial nerves 3-12 appear to be intact, no obvious focal weakness,gait within normal limits no abnormal reflexes or asymmetrical SKIN: No acute rashes normal turgor, color, no bruising or petechiae. PSYCH: Oriented, good eye contact, no obvious depression anxiety, cognition and judgment appear normal. LN: no  cervical axillary inguinal adenopathy No noted deficits in memory, attention, and speech.   Lab Results  Component Value Date   WBC 2.8 (L) 05/09/2016   HGB 13.6 05/09/2016   HCT 39.9 05/09/2016   PLT 259.0 05/09/2016   GLUCOSE 80 05/09/2016   CHOL 291 (H) 05/09/2016   TRIG 47.0 05/09/2016   HDL 114.60 05/09/2016   LDLDIRECT 149.6 02/02/2013   LDLCALC 167 (H) 05/09/2016   ALT 29 05/09/2016   AST 28 05/09/2016   NA 140 05/09/2016   K 4.4 05/09/2016   CL  104 05/09/2016   CREATININE 0.62 05/09/2016   BUN 20 05/09/2016   CO2 29 05/09/2016   TSH 2.32 05/09/2016   INR 0.91 11/10/2009    ASSESSMENT AND PLAN:  Discussed the following assessment and plan:  Visit for preventive health examination  Hyperlipidemia - is healthy  optinos discussed cont lsi cac  if covere by insurance if low reassuring as far as risk - Plan: CT CARDIAC SCORING  Leukopenia - nop sx monitor q 6 months   suspect autoimmunie predisposition  Hypothyroidism, unspecified hypothyroidism type - Plan: CT CARDIAC SCORING  Elevated BP - reapeat  better but still high normal  follow   Encounter for cardiac risk counseling - Plan: CT CARDIAC SCORING Discussed risk benefit of statin medicine uncertain in her situation. Elevated by Framingham score but no other factors. Consider getting coronary arteries calcium score for risk assessment versus seeing cardiology as a consult. Continue her healthy intervention. Check CBC in 6 months no office visit doing well. Consider seeing health coach for the wellness although review of history done today. Patient Care Team: Burnis Medin, MD as PCP - General Elsie Saas, MD (Orthopedic Surgery) Unice Bailey, MD (Internal Medicine) Jerene Bears, MD as Consulting Physician (Gastroenterology) Jerene Bears, MD as Consulting Physician (Gastroenterology) Marica Otter, OD (Optometry)  Patient Instructions   Continue lifestyle intervention healthy eating and exercise  . moinitor blood  count as we are doing  Repeat cbc diff in about 6 months  NO OV needed if doing ok .  Can consider   Coronary artery calcioum score of consider   Statin med.  Will put in order to see if  Covered by insurance vs  Seeing a cards .   Continue bp monitoring  And    If continuing up  Then get back with [place].  Can do wellness with health coach   Wt Readings from Last 3 Encounters:  05/14/16 127 lb 3.2 oz (57.7 kg)  05/03/15 136 lb (61.7 kg)  06/02/14 139 lb (63 kg)   Health Maintenance, Female Adopting a healthy lifestyle and getting preventive care can go a long way to promote health and wellness. Talk with your health care provider about what schedule of regular examinations is right for you. This is a good chance for you to check in with your provider about disease prevention and staying healthy. In between checkups, there are plenty of things you can do on your own. Experts have done a lot of research about which lifestyle changes and preventive measures are most likely to keep you healthy. Ask your health care provider for more information. WEIGHT AND DIET  Eat a healthy diet  Be sure to include plenty of vegetables, fruits, low-fat dairy products, and lean protein.  Do not eat a lot of foods high in solid fats, added sugars, or salt.  Get regular exercise. This is one of the most important things you can do for your health.  Most adults should exercise for at least 150 minutes each week. The exercise should increase your heart rate and make you sweat (moderate-intensity exercise).  Most adults should also do strengthening exercises at least twice a week. This is in addition to the moderate-intensity exercise.  Maintain a healthy weight  Body mass index (BMI) is a measurement that can be used to identify possible weight problems. It estimates body fat based on height and weight. Your health care provider can help determine your BMI and help you achieve or maintain a  healthy weight.  For females 67 years of age and older:   A BMI below 18.5 is considered underweight.  A BMI of 18.5 to 24.9 is normal.  A BMI of 25 to 29.9 is considered overweight.  A BMI of 30 and above is considered obese.  Watch levels of cholesterol and blood lipids  You should start having your blood tested for lipids and cholesterol at 73 years of age, then have this test every 5 years.  You may need to have your cholesterol levels checked more often if:  Your lipid or cholesterol levels are high.  You are older than 73 years of age.  You are at high risk for heart disease.  CANCER SCREENING   Lung Cancer  Lung cancer screening is recommended for adults 84-75 years old who are at high risk for lung cancer because of a history of smoking.  A yearly low-dose CT scan of the lungs is recommended for people who:  Currently smoke.  Have quit within the past 15 years.  Have at least a 30-pack-year history of smoking. A pack year is smoking an average of one pack of cigarettes a day for 1 year.  Yearly screening should continue until it has been 15 years since you quit.  Yearly screening should stop if you develop a health problem that would prevent you from having lung cancer treatment.  Breast Cancer  Practice breast self-awareness. This means understanding how your breasts normally appear and feel.  It also means doing regular breast self-exams. Let your health care provider know about any changes, no matter how small.  If you are in your 20s or 30s, you should have a clinical breast exam (CBE) by a health care provider every 1-3 years as part of a regular health exam.  If you are 73 or older, have a CBE every year. Also consider having a breast X-ray (mammogram) every year.  If you have a family history of breast cancer, talk to your health care provider about genetic screening.  If you are at high risk for breast cancer, talk to your health care provider  about having an MRI and a mammogram every year.  Breast cancer gene (BRCA) assessment is recommended for women who have family members with BRCA-related cancers. BRCA-related cancers include:  Breast.  Ovarian.  Tubal.  Peritoneal cancers.  Results of the assessment will determine the need for genetic counseling and BRCA1 and BRCA2 testing. Cervical Cancer Your health care provider may recommend that you be screened regularly for cancer of the pelvic organs (ovaries, uterus, and vagina). This screening involves a pelvic examination, including checking for microscopic changes to the surface of your cervix (Pap test). You may be encouraged to have this screening done every 3 years, beginning at age 23.  For women ages 72-65, health care providers may recommend pelvic exams and Pap testing every 3 years, or they may recommend the Pap and pelvic exam, combined with testing for human papilloma virus (HPV), every 5 years. Some types of HPV increase your risk of cervical cancer. Testing for HPV may also be done on women of any age with unclear Pap test results.  Other health care providers may not recommend any screening for nonpregnant women who are considered low risk for pelvic cancer and who do not have symptoms. Ask your health care provider if a screening pelvic exam is right for you.  If you have had past treatment for cervical cancer or a condition that could lead to  cancer, you need Pap tests and screening for cancer for at least 20 years after your treatment. If Pap tests have been discontinued, your risk factors (such as having a new sexual partner) need to be reassessed to determine if screening should resume. Some women have medical problems that increase the chance of getting cervical cancer. In these cases, your health care provider may recommend more frequent screening and Pap tests. Colorectal Cancer  This type of cancer can be detected and often prevented.  Routine colorectal  cancer screening usually begins at 73 years of age and continues through 73 years of age.  Your health care provider may recommend screening at an earlier age if you have risk factors for colon cancer.  Your health care provider may also recommend using home test kits to check for hidden blood in the stool.  A small camera at the end of a tube can be used to examine your colon directly (sigmoidoscopy or colonoscopy). This is done to check for the earliest forms of colorectal cancer.  Routine screening usually begins at age 88.  Direct examination of the colon should be repeated every 5-10 years through 73 years of age. However, you may need to be screened more often if early forms of precancerous polyps or small growths are found. Skin Cancer  Check your skin from head to toe regularly.  Tell your health care provider about any new moles or changes in moles, especially if there is a change in a mole's shape or color.  Also tell your health care provider if you have a mole that is larger than the size of a pencil eraser.  Always use sunscreen. Apply sunscreen liberally and repeatedly throughout the day.  Protect yourself by wearing long sleeves, pants, a wide-brimmed hat, and sunglasses whenever you are outside. HEART DISEASE, DIABETES, AND HIGH BLOOD PRESSURE   High blood pressure causes heart disease and increases the risk of stroke. High blood pressure is more likely to develop in:  People who have blood pressure in the high end of the normal range (130-139/85-89 mm Hg).  People who are overweight or obese.  People who are African American.  If you are 43-48 years of age, have your blood pressure checked every 3-5 years. If you are 55 years of age or older, have your blood pressure checked every year. You should have your blood pressure measured twice--once when you are at a hospital or clinic, and once when you are not at a hospital or clinic. Record the average of the two  measurements. To check your blood pressure when you are not at a hospital or clinic, you can use:  An automated blood pressure machine at a pharmacy.  A home blood pressure monitor.  If you are between 59 years and 79 years old, ask your health care provider if you should take aspirin to prevent strokes.  Have regular diabetes screenings. This involves taking a blood sample to check your fasting blood sugar level.  If you are at a normal weight and have a low risk for diabetes, have this test once every three years after 73 years of age.  If you are overweight and have a high risk for diabetes, consider being tested at a younger age or more often. PREVENTING INFECTION  Hepatitis B  If you have a higher risk for hepatitis B, you should be screened for this virus. You are considered at high risk for hepatitis B if:  You were born in a country where  hepatitis B is common. Ask your health care provider which countries are considered high risk.  Your parents were born in a high-risk country, and you have not been immunized against hepatitis B (hepatitis B vaccine).  You have HIV or AIDS.  You use needles to inject street drugs.  You live with someone who has hepatitis B.  You have had sex with someone who has hepatitis B.  You get hemodialysis treatment.  You take certain medicines for conditions, including cancer, organ transplantation, and autoimmune conditions. Hepatitis C  Blood testing is recommended for:  Everyone born from [birth year] through 1963.  Anyone with known risk factors for hepatitis C. Sexually transmitted infections (STIs)  You should be screened for sexually transmitted infections (STIs) including gonorrhea and chlamydia if:  You are sexually active and are younger than 73 years of age.  You are older than 73 years of age and your health care provider tells you that you are at risk for this type of infection.  Your sexual activity has changed since you were  last screened and you are at an increased risk for chlamydia or gonorrhea. Ask your health care provider if you are at risk.  If you do not have HIV, but are at risk, it may be recommended that you take a prescription medicine daily to prevent HIV infection. This is called pre-exposure prophylaxis (PrEP). You are considered at risk if:  You are sexually active and do not regularly use condoms or know the HIV status of your partner(s).  You take drugs by injection.  You are sexually active with a partner who has HIV. Talk with your health care provider about whether you are at high risk of being infected with HIV. If you choose to begin PrEP, you should first be tested for HIV. You should then be tested every 3 months for as long as you are taking PrEP.  PREGNANCY   If you are premenopausal and you may become pregnant, ask your health care provider about preconception counseling.  If you may become pregnant, take 400 to 800 micrograms (mcg) of folic acid every day.  If you want to prevent pregnancy, talk to your health care provider about birth control (contraception). OSTEOPOROSIS AND MENOPAUSE   Osteoporosis is a disease in which the bones lose minerals and strength with aging. This can result in serious bone fractures. Your risk for osteoporosis can be identified using a bone density scan.  If you are 55 years of age or older, or if you are at risk for osteoporosis and fractures, ask your health care provider if you should be screened.  Ask your health care provider whether you should take a calcium or vitamin D supplement to lower your risk for osteoporosis.  Menopause may have certain physical symptoms and risks.  Hormone replacement therapy may reduce some of these symptoms and risks. Talk to your health care provider about whether hormone replacement therapy is right for you.  HOME CARE INSTRUCTIONS   Schedule regular health, dental, and eye exams.  Stay current with your  immunizations.   Do not use any tobacco products including cigarettes, chewing tobacco, or electronic cigarettes.  If you are pregnant, do not drink alcohol.  If you are breastfeeding, limit how much and how often you drink alcohol.  Limit alcohol intake to no more than 1 drink per day for nonpregnant women. One drink equals 12 ounces of beer, 5 ounces of wine, or 1 ounces of hard liquor.  Do not use  street drugs.  Do not share needles.  Ask your health care provider for help if you need support or information about quitting drugs.  Tell your health care provider if you often feel depressed.  Tell your health care provider if you have ever been abused or do not feel safe at home.   This information is not intended to replace advice given to you by your health care provider. Make sure you discuss any questions you have with your health care provider.   Document Released: 04/01/2011 Document Revised: 10/07/2014 Document Reviewed: 08/18/2013 Elsevier Interactive Patient Education 2016 [place] K. Panosh M.D.

## 2016-05-14 ENCOUNTER — Ambulatory Visit (INDEPENDENT_AMBULATORY_CARE_PROVIDER_SITE_OTHER): Payer: PPO | Admitting: Internal Medicine

## 2016-05-14 ENCOUNTER — Encounter: Payer: Self-pay | Admitting: Internal Medicine

## 2016-05-14 VITALS — BP 140/78 | Temp 98.3°F | Ht 65.0 in | Wt 127.2 lb

## 2016-05-14 DIAGNOSIS — E785 Hyperlipidemia, unspecified: Secondary | ICD-10-CM | POA: Diagnosis not present

## 2016-05-14 DIAGNOSIS — Z7189 Other specified counseling: Secondary | ICD-10-CM

## 2016-05-14 DIAGNOSIS — R030 Elevated blood-pressure reading, without diagnosis of hypertension: Secondary | ICD-10-CM | POA: Diagnosis not present

## 2016-05-14 DIAGNOSIS — Z0000 Encounter for general adult medical examination without abnormal findings: Secondary | ICD-10-CM | POA: Diagnosis not present

## 2016-05-14 DIAGNOSIS — IMO0001 Reserved for inherently not codable concepts without codable children: Secondary | ICD-10-CM

## 2016-05-14 DIAGNOSIS — E039 Hypothyroidism, unspecified: Secondary | ICD-10-CM | POA: Diagnosis not present

## 2016-05-14 DIAGNOSIS — D72819 Decreased white blood cell count, unspecified: Secondary | ICD-10-CM | POA: Diagnosis not present

## 2016-05-14 NOTE — Patient Instructions (Addendum)
Continue lifestyle intervention healthy eating and exercise . moinitor blood  count as we are doing  Repeat cbc diff in about 6 months  NO OV needed if doing ok .  Can consider   Coronary artery calcioum score of consider   Statin med.  Will put in order to see if  Covered by insurance vs  Seeing a cards .   Continue bp monitoring  And    If continuing up  Then get back with [place].  Can do wellness with health coach   Wt Readings from Last 3 Encounters:  05/14/16 127 lb 3.2 oz (57.7 kg)  05/03/15 136 lb (61.7 kg)  06/02/14 139 lb (63 kg)   Health Maintenance, Female Adopting a healthy lifestyle and getting preventive care can go a long way to promote health and wellness. Talk with your health care provider about what schedule of regular examinations is right for you. This is a good chance for you to check in with your provider about disease prevention and staying healthy. In between checkups, there are plenty of things you can do on your own. Experts have done a lot of research about which lifestyle changes and preventive measures are most likely to keep you healthy. Ask your health care provider for more information. WEIGHT AND DIET  Eat a healthy diet  Be sure to include plenty of vegetables, fruits, low-fat dairy products, and lean protein.  Do not eat a lot of foods high in solid fats, added sugars, or salt.  Get regular exercise. This is one of the most important things you can do for your health.  Most adults should exercise for at least 150 minutes each week. The exercise should increase your heart rate and make you sweat (moderate-intensity exercise).  Most adults should also do strengthening exercises at least twice a week. This is in addition to the moderate-intensity exercise.  Maintain a healthy weight  Body mass index (BMI) is a measurement that can be used to identify possible weight problems. It estimates body fat based on height and weight. Your health care provider can  help determine your BMI and help you achieve or maintain a healthy weight.  For females 37 years of age and older:   A BMI below 18.5 is considered underweight.  A BMI of 18.5 to 24.9 is normal.  A BMI of 25 to 29.9 is considered overweight.  A BMI of 30 and above is considered obese.  Watch levels of cholesterol and blood lipids  You should start having your blood tested for lipids and cholesterol at 73 years of age, then have this test every 5 years.  You may need to have your cholesterol levels checked more often if:  Your lipid or cholesterol levels are high.  You are older than 73 years of age.  You are at high risk for heart disease.  CANCER SCREENING   Lung Cancer  Lung cancer screening is recommended for adults 11-66 years old who are at high risk for lung cancer because of a history of smoking.  A yearly low-dose CT scan of the lungs is recommended for people who:  Currently smoke.  Have quit within the past 15 years.  Have at least a 30-pack-year history of smoking. A pack year is smoking an average of one pack of cigarettes a day for 1 year.  Yearly screening should continue until it has been 15 years since you quit.  Yearly screening should stop if you develop a health problem that  would prevent you from having lung cancer treatment.  Breast Cancer  Practice breast self-awareness. This means understanding how your breasts normally appear and feel.  It also means doing regular breast self-exams. Let your health care provider know about any changes, no matter how small.  If you are in your 20s or 30s, you should have a clinical breast exam (CBE) by a health care provider every 1-3 years as part of a regular health exam.  If you are 41 or older, have a CBE every year. Also consider having a breast X-ray (mammogram) every year.  If you have a family history of breast cancer, talk to your health care provider about genetic screening.  If you are at high  risk for breast cancer, talk to your health care provider about having an MRI and a mammogram every year.  Breast cancer gene (BRCA) assessment is recommended for women who have family members with BRCA-related cancers. BRCA-related cancers include:  Breast.  Ovarian.  Tubal.  Peritoneal cancers.  Results of the assessment will determine the need for genetic counseling and BRCA1 and BRCA2 testing. Cervical Cancer Your health care provider may recommend that you be screened regularly for cancer of the pelvic organs (ovaries, uterus, and vagina). This screening involves a pelvic examination, including checking for microscopic changes to the surface of your cervix (Pap test). You may be encouraged to have this screening done every 3 years, beginning at age 56.  For women ages 24-65, health care providers may recommend pelvic exams and Pap testing every 3 years, or they may recommend the Pap and pelvic exam, combined with testing for human papilloma virus (HPV), every 5 years. Some types of HPV increase your risk of cervical cancer. Testing for HPV may also be done on women of any age with unclear Pap test results.  Other health care providers may not recommend any screening for nonpregnant women who are considered low risk for pelvic cancer and who do not have symptoms. Ask your health care provider if a screening pelvic exam is right for you.  If you have had past treatment for cervical cancer or a condition that could lead to cancer, you need Pap tests and screening for cancer for at least 20 years after your treatment. If Pap tests have been discontinued, your risk factors (such as having a new sexual partner) need to be reassessed to determine if screening should resume. Some women have medical problems that increase the chance of getting cervical cancer. In these cases, your health care provider may recommend more frequent screening and Pap tests. Colorectal Cancer  This type of cancer can  be detected and often prevented.  Routine colorectal cancer screening usually begins at 73 years of age and continues through 73 years of age.  Your health care provider may recommend screening at an earlier age if you have risk factors for colon cancer.  Your health care provider may also recommend using home test kits to check for hidden blood in the stool.  A small camera at the end of a tube can be used to examine your colon directly (sigmoidoscopy or colonoscopy). This is done to check for the earliest forms of colorectal cancer.  Routine screening usually begins at age 27.  Direct examination of the colon should be repeated every 5-10 years through 73 years of age. However, you may need to be screened more often if early forms of precancerous polyps or small growths are found. Skin Cancer  Check your skin from  head to toe regularly.  Tell your health care provider about any new moles or changes in moles, especially if there is a change in a mole's shape or color.  Also tell your health care provider if you have a mole that is larger than the size of a pencil eraser.  Always use sunscreen. Apply sunscreen liberally and repeatedly throughout the day.  Protect yourself by wearing long sleeves, pants, a wide-brimmed hat, and sunglasses whenever you are outside. HEART DISEASE, DIABETES, AND HIGH BLOOD PRESSURE   High blood pressure causes heart disease and increases the risk of stroke. High blood pressure is more likely to develop in:  People who have blood pressure in the high end of the normal range (130-139/85-89 mm Hg).  People who are overweight or obese.  People who are African American.  If you are 36-65 years of age, have your blood pressure checked every 3-5 years. If you are 59 years of age or older, have your blood pressure checked every year. You should have your blood pressure measured twice--once when you are at a hospital or clinic, and once when you are not at a  hospital or clinic. Record the average of the two measurements. To check your blood pressure when you are not at a hospital or clinic, you can use:  An automated blood pressure machine at a pharmacy.  A home blood pressure monitor.  If you are between 13 years and 69 years old, ask your health care provider if you should take aspirin to prevent strokes.  Have regular diabetes screenings. This involves taking a blood sample to check your fasting blood sugar level.  If you are at a normal weight and have a low risk for diabetes, have this test once every three years after 73 years of age.  If you are overweight and have a high risk for diabetes, consider being tested at a younger age or more often. PREVENTING INFECTION  Hepatitis B  If you have a higher risk for hepatitis B, you should be screened for this virus. You are considered at high risk for hepatitis B if:  You were born in a country where hepatitis B is common. Ask your health care provider which countries are considered high risk.  Your parents were born in a high-risk country, and you have not been immunized against hepatitis B (hepatitis B vaccine).  You have HIV or AIDS.  You use needles to inject street drugs.  You live with someone who has hepatitis B.  You have had sex with someone who has hepatitis B.  You get hemodialysis treatment.  You take certain medicines for conditions, including cancer, organ transplantation, and autoimmune conditions. Hepatitis C  Blood testing is recommended for:  Everyone born from [birth year] through 1965.  Anyone with known risk factors for hepatitis C. Sexually transmitted infections (STIs)  You should be screened for sexually transmitted infections (STIs) including gonorrhea and chlamydia if:  You are sexually active and are younger than 73 years of age.  You are older than 73 years of age and your health care provider tells you that you are at risk for this type of  infection.  Your sexual activity has changed since you were last screened and you are at an increased risk for chlamydia or gonorrhea. Ask your health care provider if you are at risk.  If you do not have HIV, but are at risk, it may be recommended that you take a prescription medicine daily to prevent HIV  infection. This is called pre-exposure prophylaxis (PrEP). You are considered at risk if:  You are sexually active and do not regularly use condoms or know the HIV status of your partner(s).  You take drugs by injection.  You are sexually active with a partner who has HIV. Talk with your health care provider about whether you are at high risk of being infected with HIV. If you choose to begin PrEP, you should first be tested for HIV. You should then be tested every 3 months for as long as you are taking PrEP.  PREGNANCY   If you are premenopausal and you may become pregnant, ask your health care provider about preconception counseling.  If you may become pregnant, take 400 to 800 micrograms (mcg) of folic acid every day.  If you want to prevent pregnancy, talk to your health care provider about birth control (contraception). OSTEOPOROSIS AND MENOPAUSE   Osteoporosis is a disease in which the bones lose minerals and strength with aging. This can result in serious bone fractures. Your risk for osteoporosis can be identified using a bone density scan.  If you are 35 years of age or older, or if you are at risk for osteoporosis and fractures, ask your health care provider if you should be screened.  Ask your health care provider whether you should take a calcium or vitamin D supplement to lower your risk for osteoporosis.  Menopause may have certain physical symptoms and risks.  Hormone replacement therapy may reduce some of these symptoms and risks. Talk to your health care provider about whether hormone replacement therapy is right for you.  HOME CARE INSTRUCTIONS   Schedule regular  health, dental, and eye exams.  Stay current with your immunizations.   Do not use any tobacco products including cigarettes, chewing tobacco, or electronic cigarettes.  If you are pregnant, do not drink alcohol.  If you are breastfeeding, limit how much and how often you drink alcohol.  Limit alcohol intake to no more than 1 drink per day for nonpregnant women. One drink equals 12 ounces of beer, 5 ounces of wine, or 1 ounces of hard liquor.  Do not use street drugs.  Do not share needles.  Ask your health care provider for help if you need support or information about quitting drugs.  Tell your health care provider if you often feel depressed.  Tell your health care provider if you have ever been abused or do not feel safe at home.   This information is not intended to replace advice given to you by your health care provider. Make sure you discuss any questions you have with your health care provider.   Document Released: 04/01/2011 Document Revised: 10/07/2014 Document Reviewed: 08/18/2013 Elsevier Interactive Patient Education [employer].

## 2016-05-15 ENCOUNTER — Ambulatory Visit
Admission: RE | Admit: 2016-05-15 | Discharge: 2016-05-15 | Disposition: A | Discharge status: Home / Self Care | Payer: PPO | Source: Ambulatory Visit | Attending: Internal Medicine | Admitting: Internal Medicine

## 2016-05-15 DIAGNOSIS — Z1231 Encounter for screening mammogram for malignant neoplasm of breast: Secondary | ICD-10-CM | POA: Diagnosis not present

## 2016-05-16 ENCOUNTER — Other Ambulatory Visit: Payer: Self-pay | Admitting: Internal Medicine

## 2016-05-16 NOTE — Telephone Encounter (Signed)
Sent to the pharmacy by e-script

## 2016-06-11 ENCOUNTER — Ambulatory Visit (INDEPENDENT_AMBULATORY_CARE_PROVIDER_SITE_OTHER)
Admission: RE | Admit: 2016-06-11 | Discharge: 2016-06-11 | Disposition: A | Discharge status: Home / Self Care | Payer: Self-pay | Source: Ambulatory Visit | Attending: Internal Medicine | Admitting: Internal Medicine

## 2016-06-11 DIAGNOSIS — E039 Hypothyroidism, unspecified: Secondary | ICD-10-CM

## 2016-06-11 DIAGNOSIS — E785 Hyperlipidemia, unspecified: Secondary | ICD-10-CM

## 2016-06-11 DIAGNOSIS — Z7189 Other specified counseling: Secondary | ICD-10-CM

## 2016-06-17 ENCOUNTER — Other Ambulatory Visit: Payer: PPO

## 2016-06-26 ENCOUNTER — Encounter: Payer: Self-pay | Admitting: Internal Medicine

## 2016-06-27 ENCOUNTER — Encounter: Payer: Self-pay | Admitting: Internal Medicine

## 2016-06-27 ENCOUNTER — Other Ambulatory Visit: Payer: Self-pay | Admitting: *Deleted

## 2016-06-27 DIAGNOSIS — R911 Solitary pulmonary nodule: Secondary | ICD-10-CM | POA: Insufficient documentation

## 2016-06-27 DIAGNOSIS — R931 Abnormal findings on diagnostic imaging of heart and coronary circulation: Secondary | ICD-10-CM | POA: Insufficient documentation

## 2016-06-27 DIAGNOSIS — I359 Nonrheumatic aortic valve disorder, unspecified: Secondary | ICD-10-CM | POA: Insufficient documentation

## 2016-06-27 NOTE — Telephone Encounter (Signed)
Have patient see result note  Or read it to her  And plan fu echo etc  And ov  cac score of 0 is excellent

## 2016-07-04 NOTE — Progress Notes (Signed)
Subjective:   Crystal Odom is a 75 y.o. female who presents for Medicare Annual (Subsequent) preventive examination.  HRA assessment completed during this visit with Ms. Ardizzone The Patient was informed that the wellness visit is to identify future health risk and educate and initiate measures that can reduce risk for increased disease through the lifespan.    NO ROS; Medicare Wellness Visit 132 /80 in right; 120/80 in the left; reassured this was good, bp may be trending up overall and to monitor sodium if BP is noted to be high; Will treat 140/80 or higher consistently  Describes health as good, fair or great? Good  Last OV; 04/2016   Risk Associated with PMH / even thought health is good, still concerned with  Leukopenia: WBC 2.8 / recheck in 6 months  Went to hematologist;  Has also run a test for autoimmune RH doctor considered autoimmune hepatitis but that was ruled out  Not a baby boomer for hep c check   Hyperlipidemia/ lipids one month ago chol 291; trigl 47; HDL 114; LDL 167;  Had scan of arteries and now planning an echo but no one has contacted her. Will fup regarding referral   Hearing loss / referral to audiology at the [facility] needs a referral on letter head- will complete and then call her to advise once complete;  Hearing high pitches are difficult to hear; audiometer check is '2000hz'$  both ears but less in the right.     Psychosocial ( mother HD; Father OA and HD) Mother died 1; overweight; not as active Spouse had recent  CAD   Sister had autoimmune disease/ expresses some concern due to her blood dyscrasia;   Primary Prevention Tobacco never smoked  ETOH 2 to 3 glasses of wine a week;   Medications reviewed for issues;  meds reviewed; no issues Calcium and Vit D;  Calcium '1200mg'$  with Vit D 800u per day; more as directed by physician Strength building exercises discussed; can include walking; housework; small weights or stretch  bands; silver sneakers if access to the Y  Diet Heart healthy, discussed decreasing sugar; watching fats. Given information on high cholesterol food;  Trying to eat health; (when spouse had heart issue x 24 yo)  Cut back on sodium and continues to monitor    Exercise Weight bearing; 25 to 30 minutes every day Yoga; about 5 to 6 days a week  Helps with balance and strength  Due to much core work    Vision; last May wears contacts Vision; followed regularly; has contacts; no medical issues  Dental; regular cleaning  Safety Fall hx; one fall with grand child; tripped but no injury  Information given on personal and community safety   1. for risk such as safe community 2.  smoke detector 3.  firearms safety if applicable  4. protection when in the sun;  5. driving safety for seniors or any recent accidents / none    Screenings for secondary risk Health Maintenance Due  Topic Date Due  . INFLUENZA VACCINE  04/30/2016    Mammogram 05/15/2016 Dexa 05/2015 / LP -0.9 and Femoral -1.4 Calcium and Vit D and repeat in 2 years Given a list of foods with calcium to evaluated taking 1 to 2 calcium supplements per day   Colonoscopy 05/2014/ due 05/2024 EKG 10/2008 Other     Depression; anxiety or mood issues assessed  Do you have little interest or pleasure in doing things? No Have you been feeling down,  depressed, hopeless? no PHQ9 waived    Cognitive screen completed; MMSE documented or assessed for failures or issues with the AD8 screen below:   Ad8 score reviewed for issues;  Issues making decisions; no  Less interest in hobbies / activities" no  Repeats questions, stories; family complaining: NO  Trouble using ordinary gadgets; microwave; computer: no  Forgets the month or year: no  Mismanaging finances: no  Missing apt: no but does write them down  Daily problems with thinking of memory NO Ad8 score is 0  MMSE not appropriate unless AD8 score is > 2    Advanced Directive has been completed    Current Care Team reviewed and updated     Cardiac Risk Factors include: advanced age (>2men, >67 women);dyslipidemia     Objective:     Vitals: Pulse 68   Ht 5\' 6"  (1.676 m)   Wt 129 lb 6 oz (58.7 kg)   SpO2 95%   BMI 20.88 kg/m   Body mass index is 20.88 kg/m.   Tobacco History  Smoking Status  . Never Smoker  Smokeless Tobacco  . Never Used     Counseling given: Not Answered   Past Medical History:  Diagnosis Date  . 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 Arthritis    left knee  . Diplopia 2002   eval neg  . Hyperlipidemia    HDL over 100  . Hypothyroidism   . Kidney stones    in past  . Leukopenia    with neg hemevaluation bm bx get wbc diff q 6 months  . UTI (lower urinary tract infection)    Past Surgical History:  Procedure Laterality Date  . BONE MARROW BIOPSY     low WBC  . kidney stone removal    . left arthroscopic  2005   left knee  . LIVER BIOPSY     reported normal  . TONSILLECTOMY    . TUBAL LIGATION     Family History  Problem Relation Age of Onset  . Other Father     major renal failure; SCCA in ear  . Osteoarthritis Father   . Heart disease Father   . Heart disease Mother     101  . Autoimmune disease Sister   . Autoimmune disease Brother   . Stroke Maternal Grandmother   . Thyroid disease    . Stroke    . Osteoporosis     History  Sexual Activity  . Sexual activity: Not on file    Outpatient Encounter Prescriptions as of 07/05/2016  Medication Sig  . aspirin 81 MG tablet Take 81 mg by mouth daily.    . fish oil-omega-3 fatty acids 1000 MG capsule Take 1 g by mouth daily.   09/04/2016 levothyroxine (SYNTHROID, LEVOTHROID) 88 MCG tablet TAKE 1 TABLET BY MOUTH ONCE DAILY   No facility-administered encounter medications on file as of 07/05/2016.     Activities of Daily Living In your present state of health, do you have any  difficulty performing the following activities: 07/05/2016  Hearing? N  Vision? N  Difficulty concentrating or making decisions? N  Walking or climbing stairs? N  Dressing or bathing? N  Doing errands, shopping? N  Preparing Food and eating ? N  Using the Toilet? N  In the past six months, have you accidently leaked urine? N  Do you have problems with loss of bowel control? N  Managing your Medications?  N  Managing your Finances? N  Housekeeping or managing your Housekeeping? N  Some recent data might be hidden    Patient Care Team: Burnis Medin, MD as PCP - General Elsie Saas, MD (Orthopedic Surgery) Unice Bailey, MD (Internal Medicine) Jerene Bears, MD as Consulting Physician (Gastroenterology) Jerene Bears, MD as Consulting Physician (Gastroenterology) Marica Otter, OD (Optometry)    Assessment:    Established and updated Risk reviewed and appropriate referral made or health recommendations as appropriate based on individual needs and choices;  Educated BP and reducing sodium in diet Educated eating well, shopping for fresh food and staying on the perimeter of the grocery store;  Educated on foods that contain chol, but also on the suspected impact of to much refined sugar   Will fup on referral for echo Then will outreach Dr. Regis Bill for consultation; as Study referenced small lung nodule; and addressed calcium in aorta...etc.  Referring to [facility] per her request for hearing evaluation  Will continue to exercise and enjoy her life   Exercise Activities and Dietary recommendations Current Exercise Habits: Structured exercise class;Home exercise routine, Type of exercise: yoga (yoga), Time (Minutes): 60, Frequency (Times/Week): 5, Weekly Exercise (Minutes/Week): 300, Intensity: Moderate  Goals    . Reduce sodium intake          Will try herbs substitute  Will give further info on dash diet  Trying to be plant based       Fall Risk Fall Risk  07/05/2016  05/14/2016 05/03/2015 05/03/2015 05/02/2014  Falls in the past year? Yes Yes (No Data) Yes Yes  Number falls in past yr: - 1 1 - 1  Injury with Fall? - Yes Yes - Yes  Risk for fall due to : - Other (Comment) - - -   Depression Screen PHQ 2/9 Scores 07/05/2016 05/14/2016 05/03/2015 05/02/2014  PHQ - 2 Score 0 0 0 0     Cognitive Testing MMSE - Mini Mental State Exam 07/05/2016  Not completed: (No Data)   Ad8 score is 0   Immunization History  Administered Date(s) Administered  . Hep A / Hep B 12/28/2009, 02/01/2010  . Influenza Split 06/17/2013  . Influenza Whole 08/10/2007, 07/07/2008  . Influenza, High Dose Seasonal PF 07/13/2014, 07/05/2016  . Influenza-Unspecified 06/28/2015  . Pneumococcal Conjugate-13 05/02/2014  . Pneumococcal Polysaccharide-23 07/19/2008  . Td 10/31/2002  . Tdap 02/02/2013  . Zoster 08/10/2007   Screening Tests Health Maintenance  Topic Date Due  . INFLUENZA VACCINE  04/30/2016  . MAMMOGRAM  05/15/2018  . TETANUS/TDAP  02/03/2023  . COLONOSCOPY  06/02/2024  . DEXA SCAN  Completed  . ZOSTAVAX  Completed  . PNA vac Low Risk Adult  Completed      Plan:     Plan To schedule echo; has not called her yet and Markcus Lazenby will fup on status Of referral  May outreach Dr. Regis Bill post echo to discuss status  Request referral to hearing clinic at [facility] Will require letter and will complete today / sh  Educated regarding the dash diet   Educated on Calcium 1200 (by supplement of food) and vit D 1000 per day if not in the sun.   Took high dose flu shot today;   Rec'd report regarding During the course of the visit the patient was educated and counseled about the following appropriate screening and preventive services:   Vaccines to include Pneumoccal, Influenza, Hepatitis B, Td, Zostavax, HCV/ flu shot  Electrocardiogram  Cardiovascular Disease/ discussed  HTN; BMI normal   Colorectal cancer screening/ due 2025  Bone density screening osteopenia; discussed  calcium and supplementation based on her diet.   Diabetes screening/ neg  Glaucoma screening/ neg per her self report  Mammography/ 04/2016  Nutrition counseling / discussed heart healthy; low sodium, low sugar  Patient Instructions (the written plan) was given to the patient.   Wynetta Fines, RN  07/05/2016

## 2016-07-05 ENCOUNTER — Ambulatory Visit (INDEPENDENT_AMBULATORY_CARE_PROVIDER_SITE_OTHER): Payer: PPO

## 2016-07-05 ENCOUNTER — Ambulatory Visit: Payer: PPO | Admitting: Family Medicine

## 2016-07-05 DIAGNOSIS — Z23 Encounter for immunization: Secondary | ICD-10-CM | POA: Diagnosis not present

## 2016-07-05 DIAGNOSIS — Z0000 Encounter for general adult medical examination without abnormal findings: Secondary | ICD-10-CM | POA: Diagnosis not present

## 2016-07-05 NOTE — Progress Notes (Signed)
The patient in for AWV Hearing was 2000 hz in both ears with decreased hearing in the right ear.  The patient request a referral to [facility] speech and hearing center;   Letter in line with memo from the patient with instructions regarding the content of the letter.

## 2016-07-05 NOTE — Patient Instructions (Addendum)
Crystal Odom , Thank you for taking time to come for your Medicare Wellness Visit. I appreciate your ongoing commitment to your health goals. Please review the following plan we discussed and let me know if I can assist you in the future.   I will discuss hearing referral with Misty / Dr. Regis Bill Will complete letter evaluation of hearing loss  Have not rec'd referral yet for echo; Kely Dohn to fup   Good luck with plant based diet and low sodium    These are the goals we discussed: Goals    . Reduce sodium intake          Will try herbs substitute  Will give further info on dash diet  Trying to be plant based        This is a list of the screening recommended for you and due dates:  Health Maintenance  Topic Date Due  . Flu Shot  04/30/2016  . Mammogram  05/15/2018  . Tetanus Vaccine  02/03/2023  . Colon Cancer Screening  06/02/2024  . DEXA scan (bone density measurement)  Completed  . Shingles Vaccine  Completed  . Pneumonia vaccines  Completed   Personal safety issues reviewed:  1. Consider starting a community watch program per [facility] 2.  Changes batteries is smoke detector and/or carbon monoxide detector  3.  If you have firearms; keep them in a safe place 4.  Wear protection when in the sun; Always wear sunscreen or a hat; It is good to have your doctor check your skin annually or review any new areas of concern 5. Driving safety; Keep in the right lane; stay 3 car lengths behind the car in front of you on the highway; look 3 times prior to pulling out; carry your cell phone everywhere you go!   Learn about the Yellow Dot program:  The program allows first responders at your emergency to have access to who your physician is, as well as your medications and medical conditions.  Citizens requesting the Yellow Dot Packages should contact Master Corporal Nunzio Cobbs at the [facility] [phone] for the first week of the program  and beginning the week after Easter citizens should contact their Scientist, physiological.     DASH Eating Plan DASH stands for "Dietary Approaches to Stop Hypertension." The DASH eating plan is a healthy eating plan that has been shown to reduce high blood pressure (hypertension). Additional health benefits may include reducing the risk of type 2 diabetes mellitus, heart disease, and stroke. The DASH eating plan may also help with weight loss. WHAT DO I NEED TO KNOW ABOUT THE DASH EATING PLAN? For the DASH eating plan, you will follow these general guidelines:  Choose foods with a percent daily value for sodium of less than 5% (as listed on the food label).  Use salt-free seasonings or herbs instead of table salt or sea salt.  Check with your health care provider or pharmacist before using salt substitutes.  Eat lower-sodium products, often labeled as "lower sodium" or "no salt added."  Eat fresh foods.  Eat more vegetables, fruits, and low-fat dairy products.  Choose whole grains. Look for the word "whole" as the first word in the ingredient list.  Choose fish and skinless chicken or [place] more often than red meat. Limit fish, poultry, and meat to 6 oz (170 g) each day.  Limit sweets, desserts, sugars, and sugary drinks.  Choose heart-healthy fats.  Limit cheese to 1 oz (  28 g) per day.  Eat more home-cooked food and less restaurant, buffet, and fast food.  Limit fried foods.  Cook foods using methods other than frying.  Limit canned vegetables. If you do use them, rinse them well to decrease the sodium.  When eating at a restaurant, ask that your food be prepared with less salt, or no salt if possible. WHAT FOODS CAN I EAT? Seek help from a dietitian for individual calorie needs. Grains Whole grain or whole wheat bread. Brown rice. Whole grain or whole wheat pasta. Quinoa, bulgur, and whole grain cereals. Low-sodium cereals. Corn or whole wheat flour tortillas.  Whole grain cornbread. Whole grain crackers. Low-sodium crackers. Vegetables Fresh or frozen vegetables (raw, steamed, roasted, or grilled). Low-sodium or reduced-sodium tomato and vegetable juices. Low-sodium or reduced-sodium tomato sauce and paste. Low-sodium or reduced-sodium canned vegetables.  Fruits All fresh, canned (in natural juice), or frozen fruits. Meat and Other Protein Products Ground beef (85% or leaner), grass-fed beef, or beef trimmed of fat. Skinless chicken or [place]. Ground chicken or [place]. Pork trimmed of fat. All fish and seafood. Eggs. Dried beans, peas, or lentils. Unsalted nuts and seeds. Unsalted canned beans. Dairy Low-fat dairy products, such as skim or 1% milk, 2% or reduced-fat cheeses, low-fat ricotta or cottage cheese, or plain low-fat yogurt. Low-sodium or reduced-sodium cheeses. Fats and Oils Tub margarines without trans fats. Light or reduced-fat mayonnaise and salad dressings (reduced sodium). Avocado. Safflower, olive, or canola oils. Natural peanut or almond butter. Other Unsalted popcorn and pretzels. The items listed above may not be a complete list of recommended foods or beverages. Contact your dietitian for more options. WHAT FOODS ARE NOT RECOMMENDED? Grains White bread. White pasta. White rice. Refined cornbread. Bagels and croissants. Crackers that contain trans fat. Vegetables Creamed or fried vegetables. Vegetables in a cheese sauce. Regular canned vegetables. Regular canned tomato sauce and paste. Regular tomato and vegetable juices. Fruits Dried fruits. Canned fruit in light or heavy syrup. Fruit juice. Meat and Other Protein Products Fatty cuts of meat. Ribs, chicken wings, bacon, sausage, bologna, salami, chitterlings, fatback, hot dogs, bratwurst, and packaged luncheon meats. Salted nuts and seeds. Canned beans with salt. Dairy Whole or 2% milk, cream, half-and-half, and cream cheese. Whole-fat or sweetened yogurt. Full-fat cheeses or  blue cheese. Nondairy creamers and whipped toppings. Processed cheese, cheese spreads, or cheese curds. Condiments Onion and garlic salt, seasoned salt, table salt, and sea salt. Canned and packaged gravies. Worcestershire sauce. Tartar sauce. Barbecue sauce. Teriyaki sauce. Soy sauce, including reduced sodium. Steak sauce. Fish sauce. Oyster sauce. Cocktail sauce. Horseradish. Ketchup and mustard. Meat flavorings and tenderizers. Bouillon cubes. Hot sauce. Tabasco sauce. Marinades. Taco seasonings. Relishes. Fats and Oils Butter, stick margarine, lard, shortening, ghee, and bacon fat. Coconut, palm kernel, or palm oils. Regular salad dressings. Other Pickles and olives. Salted popcorn and pretzels. The items listed above may not be a complete list of foods and beverages to avoid. Contact your dietitian for more information. WHERE CAN I FIND MORE INFORMATION? National Heart, Lung, and Blood Institute: travelstabloid.com   This information is not intended to replace advice given to you by your health care provider. Make sure you discuss any questions you have with your health care provider.   Document Released: 09/05/2011 Document Revised: 10/07/2014 Document Reviewed: 07/21/2013 Elsevier Interactive Patient Education 2016 [place] in the Home  Falls can cause injuries. They can happen to people of all ages. There are many things  you can do to make your home safe and to help prevent falls.  WHAT CAN I DO ON THE OUTSIDE OF MY HOME?  Regularly fix the edges of walkways and driveways and fix any cracks.  Remove anything that might make you trip as you walk through a door, such as a raised step or threshold.  Trim any bushes or trees on the path to your home.  Use bright outdoor lighting.  Clear any walking paths of anything that might make someone trip, such as rocks or tools.  Regularly check to see if handrails are loose or  broken. Make sure that both sides of any steps have handrails.  Any raised decks and porches should have guardrails on the edges.  Have any leaves, snow, or ice cleared regularly.  Use sand or salt on walking paths during winter.  Clean up any spills in your garage right away. This includes oil or grease spills. WHAT CAN I DO IN THE BATHROOM?   Use night lights.  Install grab bars by the toilet and in the tub and shower. Do not use towel bars as grab bars.  Use non-skid mats or decals in the tub or shower.  If you need to sit down in the shower, use a plastic, non-slip stool.  Keep the floor dry. Clean up any water that spills on the floor as soon as it happens.  Remove soap buildup in the tub or shower regularly.  Attach bath mats securely with double-sided non-slip rug tape.  Do not have throw rugs and other things on the floor that can make you trip. WHAT CAN I DO IN THE BEDROOM?  Use night lights.  Make sure that you have a light by your bed that is easy to reach.  Do not use any sheets or blankets that are too big for your bed. They should not hang down onto the floor.  Have a firm chair that has side arms. You can use this for support while you get dressed.  Do not have throw rugs and other things on the floor that can make you trip. WHAT CAN I DO IN THE KITCHEN?  Clean up any spills right away.  Avoid walking on wet floors.  Keep items that you use a lot in easy-to-reach places.  If you need to reach something above you, use a strong step stool that has a grab bar.  Keep electrical cords out of the way.  Do not use floor polish or wax that makes floors slippery. If you must use wax, use non-skid floor wax.  Do not have throw rugs and other things on the floor that can make you trip. WHAT CAN I DO WITH MY STAIRS?  Do not leave any items on the stairs.  Make sure that there are handrails on both sides of the stairs and use them. Fix handrails that are  broken or loose. Make sure that handrails are as long as the stairways.  Check any carpeting to make sure that it is firmly attached to the stairs. Fix any carpet that is loose or worn.  Avoid having throw rugs at the top or bottom of the stairs. If you do have throw rugs, attach them to the floor with carpet tape.  Make sure that you have a light switch at the top of the stairs and the bottom of the stairs. If you do not have them, ask someone to add them for you. WHAT ELSE CAN I DO TO HELP PREVENT  FALLS?  Wear shoes that:  Do not have high heels.  Have rubber bottoms.  Are comfortable and fit you well.  Are closed at the toe. Do not wear sandals.  If you use a stepladder:  Make sure that it is fully opened. Do not climb a closed stepladder.  Make sure that both sides of the stepladder are locked into place.  Ask someone to hold it for you, if possible.  Clearly mark and make sure that you can see:  Any grab bars or handrails.  First and last steps.  Where the edge of each step is.  Use tools that help you move around (mobility aids) if they are needed. These include:  Canes.  Walkers.  Scooters.  Crutches.  Turn on the lights when you go into a dark area. Replace any light bulbs as soon as they burn out.  Set up your furniture so you have a clear path. Avoid moving your furniture around.  If any of your floors are uneven, fix them.  If there are any pets around you, be aware of where they are.  Review your medicines with your doctor. Some medicines can make you feel dizzy. This can increase your chance of falling. Ask your doctor what other things that you can do to help prevent falls.   This information is not intended to replace advice given to you by your health care provider. Make sure you discuss any questions you have with your health care provider.   Document Released: 07/13/2009 Document Revised: 01/31/2015 Document Reviewed: 10/21/2014 Elsevier  Interactive Patient Education 2016 [employer].  Health Maintenance, Female Adopting a healthy lifestyle and getting preventive care can go a long way to promote health and wellness. Talk with your health care provider about what schedule of regular examinations is right for you. This is a good chance for you to check in with your provider about disease prevention and staying healthy. In between checkups, there are plenty of things you can do on your own. Experts have done a lot of research about which lifestyle changes and preventive measures are most likely to keep you healthy. Ask your health care provider for more information. WEIGHT AND DIET  Eat a healthy diet  Be sure to include plenty of vegetables, fruits, low-fat dairy products, and lean protein.  Do not eat a lot of foods high in solid fats, added sugars, or salt.  Get regular exercise. This is one of the most important things you can do for your health.  Most adults should exercise for at least 150 minutes each week. The exercise should increase your heart rate and make you sweat (moderate-intensity exercise).  Most adults should also do strengthening exercises at least twice a week. This is in addition to the moderate-intensity exercise.  Maintain a healthy weight  Body mass index (BMI) is a measurement that can be used to identify possible weight problems. It estimates body fat based on height and weight. Your health care provider can help determine your BMI and help you achieve or maintain a healthy weight.  For females 65 years of age and older:   A BMI below 18.5 is considered underweight.  A BMI of 18.5 to 24.9 is normal.  A BMI of 25 to 29.9 is considered overweight.  A BMI of 30 and above is considered obese.  Watch levels of cholesterol and blood lipids  You should start having your blood tested for lipids and cholesterol at 73 years of age, then  have this test every 5 years.  You may need to have your  cholesterol levels checked more often if:  Your lipid or cholesterol levels are high.  You are older than 73 years of age.  You are at high risk for heart disease.  CANCER SCREENING   Lung Cancer  Lung cancer screening is recommended for adults 45-77 years old who are at high risk for lung cancer because of a history of smoking.  A yearly low-dose CT scan of the lungs is recommended for people who:  Currently smoke.  Have quit within the past 15 years.  Have at least a 30-pack-year history of smoking. A pack year is smoking an average of one pack of cigarettes a day for 1 year.  Yearly screening should continue until it has been 15 years since you quit.  Yearly screening should stop if you develop a health problem that would prevent you from having lung cancer treatment.  Breast Cancer  Practice breast self-awareness. This means understanding how your breasts normally appear and feel.  It also means doing regular breast self-exams. Let your health care provider know about any changes, no matter how small.  If you are in your 20s or 30s, you should have a clinical breast exam (CBE) by a health care provider every 1-3 years as part of a regular health exam.  If you are 57 or older, have a CBE every year. Also consider having a breast X-ray (mammogram) every year.  If you have a family history of breast cancer, talk to your health care provider about genetic screening.  If you are at high risk for breast cancer, talk to your health care provider about having an MRI and a mammogram every year.  Breast cancer gene (BRCA) assessment is recommended for women who have family members with BRCA-related cancers. BRCA-related cancers include:  Breast.  Ovarian.  Tubal.  Peritoneal cancers.  Results of the assessment will determine the need for genetic counseling and BRCA1 and BRCA2 testing. Cervical Cancer Your health care provider may recommend that you be screened regularly  for cancer of the pelvic organs (ovaries, uterus, and vagina). This screening involves a pelvic examination, including checking for microscopic changes to the surface of your cervix (Pap test). You may be encouraged to have this screening done every 3 years, beginning at age 16.  For women ages 27-65, health care providers may recommend pelvic exams and Pap testing every 3 years, or they may recommend the Pap and pelvic exam, combined with testing for human papilloma virus (HPV), every 5 years. Some types of HPV increase your risk of cervical cancer. Testing for HPV may also be done on women of any age with unclear Pap test results.  Other health care providers may not recommend any screening for nonpregnant women who are considered low risk for pelvic cancer and who do not have symptoms. Ask your health care provider if a screening pelvic exam is right for you.  If you have had past treatment for cervical cancer or a condition that could lead to cancer, you need Pap tests and screening for cancer for at least 20 years after your treatment. If Pap tests have been discontinued, your risk factors (such as having a new sexual partner) need to be reassessed to determine if screening should resume. Some women have medical problems that increase the chance of getting cervical cancer. In these cases, your health care provider may recommend more frequent screening and Pap tests. Colorectal Cancer  This type of cancer can be detected and often prevented.  Routine colorectal cancer screening usually begins at 73 years of age and continues through 73 years of age.  Your health care provider may recommend screening at an earlier age if you have risk factors for colon cancer.  Your health care provider may also recommend using home test kits to check for hidden blood in the stool.  A small camera at the end of a tube can be used to examine your colon directly (sigmoidoscopy or colonoscopy). This is done to check  for the earliest forms of colorectal cancer.  Routine screening usually begins at age 24.  Direct examination of the colon should be repeated every 5-10 years through 73 years of age. However, you may need to be screened more often if early forms of precancerous polyps or small growths are found. Skin Cancer  Check your skin from head to toe regularly.  Tell your health care provider about any new moles or changes in moles, especially if there is a change in a mole's shape or color.  Also tell your health care provider if you have a mole that is larger than the size of a pencil eraser.  Always use sunscreen. Apply sunscreen liberally and repeatedly throughout the day.  Protect yourself by wearing long sleeves, pants, a wide-brimmed hat, and sunglasses whenever you are outside. HEART DISEASE, DIABETES, AND HIGH BLOOD PRESSURE   High blood pressure causes heart disease and increases the risk of stroke. High blood pressure is more likely to develop in:  People who have blood pressure in the high end of the normal range (130-139/85-89 mm Hg).  People who are overweight or obese.  People who are African American.  If you are 32-53 years of age, have your blood pressure checked every 3-5 years. If you are 35 years of age or older, have your blood pressure checked every year. You should have your blood pressure measured twice--once when you are at a hospital or clinic, and once when you are not at a hospital or clinic. Record the average of the two measurements. To check your blood pressure when you are not at a hospital or clinic, you can use:  An automated blood pressure machine at a pharmacy.  A home blood pressure monitor.  If you are between 32 years and 48 years old, ask your health care provider if you should take aspirin to prevent strokes.  Have regular diabetes screenings. This involves taking a blood sample to check your fasting blood sugar level.  If you are at a normal  weight and have a low risk for diabetes, have this test once every three years after 73 years of age.  If you are overweight and have a high risk for diabetes, consider being tested at a younger age or more often. PREVENTING INFECTION  Hepatitis B  If you have a higher risk for hepatitis B, you should be screened for this virus. You are considered at high risk for hepatitis B if:  You were born in a country where hepatitis B is common. Ask your health care provider which countries are considered high risk.  Your parents were born in a high-risk country, and you have not been immunized against hepatitis B (hepatitis B vaccine).  You have HIV or AIDS.  You use needles to inject street drugs.  You live with someone who has hepatitis B.  You have had sex with someone who has hepatitis B.  You get hemodialysis  treatment.  You take certain medicines for conditions, including cancer, organ transplantation, and autoimmune conditions. Hepatitis C  Blood testing is recommended for:  Everyone born from [birth year] through 1963.  Anyone with known risk factors for hepatitis C. Sexually transmitted infections (STIs)  You should be screened for sexually transmitted infections (STIs) including gonorrhea and chlamydia if:  You are sexually active and are younger than 73 years of age.  You are older than 73 years of age and your health care provider tells you that you are at risk for this type of infection.  Your sexual activity has changed since you were last screened and you are at an increased risk for chlamydia or gonorrhea. Ask your health care provider if you are at risk.  If you do not have HIV, but are at risk, it may be recommended that you take a prescription medicine daily to prevent HIV infection. This is called pre-exposure prophylaxis (PrEP). You are considered at risk if:  You are sexually active and do not regularly use condoms or know the HIV status of your partner(s).  You take  drugs by injection.  You are sexually active with a partner who has HIV. Talk with your health care provider about whether you are at high risk of being infected with HIV. If you choose to begin PrEP, you should first be tested for HIV. You should then be tested every 3 months for as long as you are taking PrEP.  PREGNANCY   If you are premenopausal and you may become pregnant, ask your health care provider about preconception counseling.  If you may become pregnant, take 400 to 800 micrograms (mcg) of folic acid every day.  If you want to prevent pregnancy, talk to your health care provider about birth control (contraception). OSTEOPOROSIS AND MENOPAUSE   Osteoporosis is a disease in which the bones lose minerals and strength with aging. This can result in serious bone fractures. Your risk for osteoporosis can be identified using a bone density scan.  If you are 15 years of age or older, or if you are at risk for osteoporosis and fractures, ask your health care provider if you should be screened.  Ask your health care provider whether you should take a calcium or vitamin D supplement to lower your risk for osteoporosis.  Menopause may have certain physical symptoms and risks.  Hormone replacement therapy may reduce some of these symptoms and risks. Talk to your health care provider about whether hormone replacement therapy is right for you.  HOME CARE INSTRUCTIONS   Schedule regular health, dental, and eye exams.  Stay current with your immunizations.   Do not use any tobacco products including cigarettes, chewing tobacco, or electronic cigarettes.  If you are pregnant, do not drink alcohol.  If you are breastfeeding, limit how much and how often you drink alcohol.  Limit alcohol intake to no more than 1 drink per day for nonpregnant women. One drink equals 12 ounces of beer, 5 ounces of wine, or 1 ounces of hard liquor.  Do not use street drugs.  Do not share  needles.  Ask your health care provider for help if you need support or information about quitting drugs.  Tell your health care provider if you often feel depressed.  Tell your health care provider if you have ever been abused or do not feel safe at home.   This information is not intended to replace advice given to you by your health care provider. Make sure  you discuss any questions you have with your health care provider.   Document Released: 04/01/2011 Document Revised: 10/07/2014 Document Reviewed: 08/18/2013 Elsevier Interactive Patient Education 2016 [employer].   Hearing Loss Hearing loss is a partial or total loss of the ability to hear. This can be temporary or permanent, and it can happen in one or both ears. Hearing loss may be referred to as deafness. Medical care is necessary to treat hearing loss properly and to prevent the condition from getting worse. Your hearing may partially or completely come back, depending on what caused your hearing loss and how severe it is. In some cases, hearing loss is permanent. CAUSES Common causes of hearing loss include:   Too much wax in the ear canal.   Infection of the ear canal or middle ear.   Fluid in the middle ear.   Injury to the ear or surrounding area.   An object stuck in the ear.   Prolonged exposure to loud sounds, such as music.  Less common causes of hearing loss include:   Tumors in the ear.   Viral or bacterial infections, such as meningitis.   A hole in the eardrum (perforated eardrum).  Problems with the hearing nerve that sends signals between the brain and the ear.  Certain medicines.  SYMPTOMS  Symptoms of this condition may include:  Difficulty telling the difference between sounds.  Difficulty following a conversation when there is background noise.  Lack of response to sounds in your environment. This may be most noticeable when you do not respond to startling sounds.  Needing to  turn up the volume on the television, radio, etc.  Ringing in the ears.  Dizziness.  Pain in the ears. DIAGNOSIS This condition is diagnosed based on a physical exam and a hearing test (audiometry). The audiometry test will be performed by a hearing specialist (audiologist). You may also be referred to an ear, nose, and throat (ENT) specialist (otolaryngologist).  TREATMENT Treatment for recent onset of hearing loss may include:   Ear wax removal.   Being prescribed medicines to prevent infection (antibiotics).   Being prescribed medicines to reduce inflammation (corticosteroids).  HOME CARE INSTRUCTIONS  If you were prescribed an antibiotic medicine, take it as told by your health care provider. Do not stop taking the antibiotic even if you start to feel better.  Take over-the-counter and prescription medicines only as told by your health care provider.  Avoid loud noises.   Return to your normal activities as told by your health care provider. Ask your health care provider what activities are safe for you.  Keep all follow-up visits as told by your health care provider. This is important. SEEK MEDICAL CARE IF:   You feel dizzy.   You develop new symptoms.   You vomit or feel nauseous.   You have a fever.  SEEK IMMEDIATE MEDICAL CARE IF:  You develop sudden changes in your vision.   You have severe ear pain.   You have new or increased weakness.  You have a severe headache.   This information is not intended to replace advice given to you by your health care provider. Make sure you discuss any questions you have with your health care provider.   Document Released: 09/16/2005 Document Revised: 06/07/2015 Document Reviewed: 02/01/2015 Elsevier Interactive Patient Education [employer].

## 2016-07-09 NOTE — Progress Notes (Signed)
I have reviewed and agree with  Above .  Lottie Dawson, MD

## 2016-07-24 ENCOUNTER — Encounter: Payer: Self-pay | Admitting: Internal Medicine

## 2016-07-24 DIAGNOSIS — H903 Sensorineural hearing loss, bilateral: Secondary | ICD-10-CM | POA: Diagnosis not present

## 2016-07-31 DIAGNOSIS — H903 Sensorineural hearing loss, bilateral: Secondary | ICD-10-CM | POA: Diagnosis not present

## 2016-08-15 ENCOUNTER — Other Ambulatory Visit: Payer: Self-pay

## 2016-08-15 ENCOUNTER — Ambulatory Visit (HOSPITAL_COMMUNITY): Payer: PPO | Attending: Cardiology

## 2016-08-15 DIAGNOSIS — I700 Atherosclerosis of aorta: Secondary | ICD-10-CM | POA: Insufficient documentation

## 2016-08-15 DIAGNOSIS — I359 Nonrheumatic aortic valve disorder, unspecified: Secondary | ICD-10-CM | POA: Diagnosis not present

## 2016-08-15 DIAGNOSIS — I340 Nonrheumatic mitral (valve) insufficiency: Secondary | ICD-10-CM | POA: Diagnosis not present

## 2016-08-15 DIAGNOSIS — I272 Pulmonary hypertension, unspecified: Secondary | ICD-10-CM | POA: Insufficient documentation

## 2016-08-15 DIAGNOSIS — I5020 Unspecified systolic (congestive) heart failure: Secondary | ICD-10-CM | POA: Insufficient documentation

## 2016-08-29 NOTE — Progress Notes (Signed)
Pre visit review using our clinic review tool, if applicable. No additional management support is needed unless otherwise documented below in the visit note.  Chief Complaint  Patient presents with  . Follow-up    HPI: Crystal Odom 75 y.o. comes in today for follow-up of scanning coronary artery calcium score that was 0 and follow-up of incidental finding of aortic sclerosis and also had an intermediate pulmonary nodule. Her blood pressure has been up and down sometimes in the 150s but sometimes in the 115 range  She feels fine no cardiovascular symptoms. ROS: See pertinent positives and negatives per HPI. No cp sob etc   Past Medical History:  Diagnosis Date  . 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 Arthritis    left knee  . Diplopia 2002   eval neg  . Hyperlipidemia    HDL over 100  . Hypothyroidism   . Kidney stones    in past  . Leukopenia    with neg hemevaluation bm bx get wbc diff q 6 months  . UTI (lower urinary tract infection)     Family History  Problem Relation Age of Onset  . Other Father     major renal failure; SCCA in ear  . Osteoarthritis Father   . Heart disease Father   . Heart disease Mother     62  . Autoimmune disease Sister   . Autoimmune disease Brother   . Stroke Maternal Grandmother   . Thyroid disease    . Stroke    . Osteoporosis      Social History   Social History  . Marital status: Married    Spouse name: N/A  . Number of children: N/A  . Years of education: N/A   Social History Main Topics  . Smoking status: Never Smoker  . Smokeless tobacco: Never Used  . Alcohol use 1.2 - 1.8 oz/week    2 - 3 Glasses of wine per week     Comment: 2 to 3 glasses a week   . Drug use: No  . Sexual activity: Not Asked   Other Topics Concern  . None   Social History Narrative   Married   Retired  2008   Regular exercise-yes   HH of 2   No pets   Social etoh   G2P2      Husband hac CABG 2012   Travel grandchildren     Outpatient Medications Prior to Visit  Medication Sig Dispense Refill  . aspirin 81 MG tablet Take 81 mg by mouth daily.      . fish oil-omega-3 fatty acids 1000 MG capsule Take 1 g by mouth daily.     Marland Kitchen levothyroxine (SYNTHROID, LEVOTHROID) 88 MCG tablet TAKE 1 TABLET BY MOUTH ONCE DAILY 90 tablet 2   No facility-administered medications prior to visit.      EXAM:  BP (!) 156/88 (BP Location: Left Arm, Patient Position: Sitting, Cuff Size: Normal)   Temp 97.9 F (36.6 C) (Oral)   Ht 5\' 6"  (1.676 m)   Wt 129 lb 6.4 oz (58.7 kg)   BMI 20.89 kg/m   Body mass index is 20.89 kg/m.  GENERAL: vitals reviewed and listed above, alert, oriented, appears well hydrated and in no acute distress PSYCH: pleasant and cooperative, no obvious depression or anxiety IMPRESSION: Coronary calcium score of 0.  Some calcification of aortic valve suggest clinical / echo correlation  [place] [place]  Impressions:  -  Normal LV size with EF 60-65%. Moderate diastolic dysfunction.   Normal RV size and systolic function. Aortic sclerosis without   significant stenosis. Mild mitral regurgitation. Borderline   pulmonary hypertension.  Reviewed findings with patient. ASSESSMENT AND PLAN:  Discussed the following assessment and plan:  diastolic dysfunction   Incidental findings  pulm nodule of  Sclerotic valve without as  Essential hypertension - Echo showing grade 2 diastolic dysfunction. - Plan: Ambulatory referral to Cardiology  Aortic valve sclerosis - Without stenosis. - Plan: Ambulatory referral to Cardiology  Agatston coronary artery calcium score less than 100  0  Abnormal echocardiogram - Plan: Ambulatory referral to Cardiology I would be more aggressive in treating intermittent hypertension  with ACE inhibitor at this time .Marland Kitchenbegin lisinopril 10 mg a day risk-benefit side effects discussed continue to monitor and follow-up in 2  months. Discussed cardiovascular risk assessment consider other opinions. I would suspect blood pressure control is the most important consideration at this time. Discussed definition of diastolic dysfunction. We'll do nonemergent referral for consultation with Dr. Tamala Julian cardiologist who takes care of her husband for cardiovascular risk assessment and plan for follow-up. At this time  Not adding statin medication -Patient advised to return or notify health care team  if symptoms worsen ,persist or new concerns arise. Total visit 40mins > 50% spent counseling and coordinating care as indicated in above note and in instructions to patient .     Patient Instructions  Begin ACE inhibitor lisinopril 10 mg once a day as we discussed. Follow-up visit in 2 months bring in  some readings in. We'll do a nonurgent consult with cardiology Dr. Tamala Julian about risk assessment and follow-up consideration of lipid intervention besides lifestyle. We can plan on follow-up CT for incidental pulmonary nodules at your next visit  .     Standley Brooking. Panosh M.D.

## 2016-08-30 ENCOUNTER — Encounter: Payer: Self-pay | Admitting: Internal Medicine

## 2016-08-30 ENCOUNTER — Ambulatory Visit (INDEPENDENT_AMBULATORY_CARE_PROVIDER_SITE_OTHER): Payer: PPO | Admitting: Internal Medicine

## 2016-08-30 VITALS — BP 156/88 | Temp 97.9°F | Ht 66.0 in | Wt 129.4 lb

## 2016-08-30 DIAGNOSIS — I358 Other nonrheumatic aortic valve disorders: Secondary | ICD-10-CM | POA: Diagnosis not present

## 2016-08-30 DIAGNOSIS — R931 Abnormal findings on diagnostic imaging of heart and coronary circulation: Secondary | ICD-10-CM | POA: Diagnosis not present

## 2016-08-30 DIAGNOSIS — I10 Essential (primary) hypertension: Secondary | ICD-10-CM

## 2016-08-30 MED ORDER — LISINOPRIL 10 MG PO TABS: 10.0000 mg | ORAL_TABLET | Freq: Every day | ORAL | 3 refills | Status: DC

## 2016-08-30 NOTE — Patient Instructions (Addendum)
Begin ACE inhibitor lisinopril 10 mg once a day as we discussed. Follow-up visit in 2 months bring in  some readings in. We'll do a nonurgent consult with cardiology Dr. Tamala Julian about risk assessment and follow-up consideration of lipid intervention besides lifestyle. We can plan on follow-up CT for incidental pulmonary nodules at your next visit  .

## 2016-10-01 DIAGNOSIS — S83241D Other tear of medial meniscus, current injury, right knee, subsequent encounter: Secondary | ICD-10-CM | POA: Diagnosis not present

## 2016-10-01 DIAGNOSIS — S83281D Other tear of lateral meniscus, current injury, right knee, subsequent encounter: Secondary | ICD-10-CM | POA: Diagnosis not present

## 2016-10-22 ENCOUNTER — Encounter: Payer: Self-pay | Admitting: Interventional Cardiology

## 2016-10-22 ENCOUNTER — Ambulatory Visit (INDEPENDENT_AMBULATORY_CARE_PROVIDER_SITE_OTHER): Payer: PPO | Admitting: Interventional Cardiology

## 2016-10-22 VITALS — BP 146/76 | HR 69 | Ht 66.0 in | Wt 131.8 lb

## 2016-10-22 DIAGNOSIS — I359 Nonrheumatic aortic valve disorder, unspecified: Secondary | ICD-10-CM

## 2016-10-22 DIAGNOSIS — I7789 Other specified disorders of arteries and arterioles: Secondary | ICD-10-CM

## 2016-10-22 DIAGNOSIS — E785 Hyperlipidemia, unspecified: Secondary | ICD-10-CM

## 2016-10-22 DIAGNOSIS — R0989 Other specified symptoms and signs involving the circulatory and respiratory systems: Secondary | ICD-10-CM

## 2016-10-22 NOTE — Progress Notes (Signed)
Cardiology Office Note    Date:  10/22/2016   ID:  Crystal Odom, DOB [birth year], MRN [id number]  PCP:  Crystal Dawson, MD  Cardiologist: Crystal Grooms, MD   Chief Complaint  Patient presents with  . Follow-up    History of Present Illness:  Crystal Odom is a 76 y.o. female for evaluation of marked lipid elevation and calcified aortic valve.  Crystal Odom has no symptomatic cardiovascular issues. There is a long-standing history of marked lipid elevation with total cholesterol levels greater than 300, LDL cholesterol averaging 170, and HDL cholesterol levels  115. She recently saw her primary physician. They began trying to determine if the lipid levels needed to be treated. A coronary calcium score was performed and was 0. An abdominal CT scan performed 10 years ago showed no evidence of calcification. The coronary calcium score study did demonstrate calcium in the aortic valve. A subsequent echocardiogram revealed grade 2 diastolic dysfunction, and aortic valve sclerosis. EF was 65%.  She and her husband Crystal Odom exercise daily. They walk at least 20 minutes per day. The walk is brisk. There is no associated dyspnea or chest discomfort. His been no change in exertional tolerance over the past 2-3 years.  Past Medical History:  Diagnosis Date  . 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 Arthritis    left knee  . Diplopia 2002   eval neg  . Hyperlipidemia    HDL over 100  . Hypothyroidism   . Kidney stones    in past  . Leukopenia    with neg hemevaluation bm bx get wbc diff q 6 months  . UTI (lower urinary tract infection)     Past Surgical History:  Procedure Laterality Date  . BONE MARROW BIOPSY     low WBC  . kidney stone removal    . left arthroscopic  2005   left knee  . LIVER BIOPSY     reported normal  . TONSILLECTOMY    . TUBAL LIGATION      Current Medications: Outpatient Medications Prior to Visit   Medication Sig Dispense Refill  . aspirin 81 MG tablet Take 81 mg by mouth daily.      . Cholecalciferol (VITAMIN D3) 1000 units CAPS Take 1 capsule by mouth daily.    . fish oil-omega-3 fatty acids 1000 MG capsule Take 1 g by mouth daily.     Marland Kitchen levothyroxine (SYNTHROID, LEVOTHROID) 88 MCG tablet TAKE 1 TABLET BY MOUTH ONCE DAILY 90 tablet 2  . lisinopril (PRINIVIL,ZESTRIL) 10 MG tablet Take 1 tablet (10 mg total) by mouth daily. 90 tablet 3   No facility-administered medications prior to visit.      Allergies:   Penicillins   Social History   Social History  . Marital status: Married    Spouse name: N/A  . Number of children: N/A  . Years of education: N/A   Social History Main Topics  . Smoking status: Never Smoker  . Smokeless tobacco: Never Used  . Alcohol use 1.2 - 1.8 oz/week    2 - 3 Glasses of wine per week     Comment: 2 to 3 glasses a week   . Drug use: No  . Sexual activity: Not Asked   Other Topics Concern  . None   Social History Narrative   Married   Retired  2008   Regular exercise-yes   HH of 2   No pets  Social etoh   G2P2      Husband hac CABG 2012   Travel grandchildren      Family History:  The patient's family history includes Autoimmune disease in her brother and sister; Heart disease in her father and mother; Osteoarthritis in her father; Other in her father; Stroke in her maternal grandmother. History of connective tissue disorders including rheumatoid arthritis.  ROS:   Please see the history of present illness.    Musculoskeletal complaints are minimal. Street of abnormal autoimmune serology. Negative liver biopsy. Street of low white blood cell count. All other systems reviewed and are negative.   PHYSICAL EXAM:   VS:  BP (!) 146/76 (BP Location: Right Arm)   Pulse 69   Ht '5\' 6"'$  (1.676 m)   Wt 131 lb 12.8 oz (59.8 kg)   BMI 21.27 kg/m    GEN: Well nourished, well developed, in no acute distress  HEENT: normal  Neck: no JVD,  or massesPossible faint left carotid bruit. Cardiac: RRR; no murmurs, rubs, or gallops,no edema  Respiratory:  clear to auscultation bilaterally, normal work of breathing GI: soft, nontender, nondistended, + BS. Pulsatile aorta in the subxiphoid area that appears to be widened. A faint abdominal bruit is heard. MS: no deformity or atrophy  Skin: warm and dry, no rash Neuro:  Alert and Oriented x 3, Strength and sensation are intact Psych: euthymic mood, full affect  Wt Readings from Last 3 Encounters:  10/22/16 131 lb 12.8 oz (59.8 kg)  08/30/16 129 lb 6.4 oz (58.7 kg)  07/05/16 129 lb 6 oz (58.7 kg)      Studies/Labs Reviewed:   EKG:  EKG  Normal sinus rhythm, prominent voltage, nonspecific ST segment sagging diffusely. ST segment changes new compared to 2010  Recent Labs: 05/09/2016: ALT 29; BUN 20; Creatinine, Ser 0.62; Hemoglobin 13.6; Platelets 259.0; Potassium 4.4; Sodium 140; TSH 2.32   Lipid Panel    Component Value Date/Time   CHOL 291 (H) 05/09/2016 0758   TRIG 47.0 05/09/2016 0758   HDL 114.60 05/09/2016 0758   CHOLHDL 3 05/09/2016 0758   VLDL 9.4 05/09/2016 0758   LDLCALC 167 (H) 05/09/2016 0758   LDLDIRECT 149.6 02/02/2013 1003    Additional studies/ records that were reviewed today include:  Cardiogram performed in member 2017: ------------------------------------------------------------------- Study Conclusions  - Left ventricle: The cavity size was normal. Wall thickness was   normal. Systolic function was normal. The estimated ejection   fraction was in the range of 60% to 65%. Wall motion was normal;   there were no regional wall motion abnormalities. Features are   consistent with a pseudonormal left ventricular filling pattern,   with concomitant abnormal relaxation and increased filling   pressure (grade 2 diastolic dysfunction). - Aortic valve: Trileaflet; moderately calcified leaflets. The   noncoronary cusp is especially calcified. Sclerosis  without   stenosis. - Mitral valve: There was mild regurgitation. - Left atrium: The atrium was mildly dilated. - Right ventricle: The cavity size was normal. Systolic function   was normal. - Tricuspid valve: Peak RV-RA gradient (S): 33 mm Hg. - Pulmonary arteries: PA peak pressure: 36 mm Hg (S). - Inferior vena cava: The vessel was normal in size. The   respirophasic diameter changes were in the normal range (>= 50%),   consistent with normal central venous pressure.  Impressions:  - Normal LV size with EF 60-65%. Moderate diastolic dysfunction.   Normal RV size and systolic function. Aortic sclerosis without  significant stenosis. Mild mitral regurgitation. Borderline   pulmonary hypertension.  CT coronary calcium score: September 2017: IMPRESSION: Coronary calcium score of 0.  Some calcification of aortic valve suggest clinical / echo correlation  ASSESSMENT:    1. Hyperlipidemia with target LDL less than 100   2. Aortic valve calcification   3. Left carotid bruit   4. Enlarged aorta (HCC)      PLAN:  In order of problems listed above:  1. Lipids are as noted above. There is no evidence of significant vascular obstruction other than a suspected very faint left carotid bruit and a mildly pulsatile abdominal aorta with faint bruit heard. With a calcium score of 0 on CT scanning and her obvious markedly elevated HDL, I am reluctant to start statin therapy. If we find evidence of plaquing in the carotids or abdominal aorta by ultrasound scanning this could influence the decision. We will order a lipid NMR to determine particle size and number. If the lipid analysis is not atherogenic, we will likely not need to start therapy. 2. No evidence of stenosis. No further evaluation is needed. Echo did demonstrate grade 2 diastolic dysfunction but there is no evidence of volume overload or heart failure symptoms. 3. Bilateral carotid Doppler study. 4. Pulsatile abdominal aorta  with faint bruit will be evaluated with an aortic ultrasound of the abdomen.   We will evaluate the studies that have been ordered. No statin therapy has been ordered at this time. Final determination will be based upon the results of the lipid NMR and whether or not plaque is identifiable in the aorta or carotids. Follow-up will be as needed.    Medication Adjustments/Labs and Tests Ordered: Current medicines are reviewed at length with the patient today.  Concerns regarding medicines are outlined above.  Medication changes, Labs and Tests ordered today are listed in the Patient Instructions below. Patient Instructions  Medication Instructions:  None  Labwork: Your physician recommends that you return for lab work for when you are fasting. (NMR, CBC w/ diff and TSH)   Testing/Procedures: Your physician has requested that you have a carotid duplex. This test is an ultrasound of the carotid arteries in your neck. It looks at blood flow through these arteries that supply the brain with blood. Allow one hour for this exam. There are no restrictions or special instructions.  Your physician has requested that you have an abdominal aorta duplex. During this test, an ultrasound is used to evaluate the aorta. Allow 30 minutes for this exam. Do not eat after midnight the day before and avoid carbonated beverages  Follow-Up: Your physician recommends that you schedule a follow-up appointment as needed with Dr. Tamala Julian.   Any Other Special Instructions Will Be Listed Below (If Applicable).     If you need a refill on your cardiac medications before your next appointment, please call your pharmacy.      Signed, Crystal Grooms, MD  10/22/2016 11:45 AM    [facility] [place], [place]  [address] Phone: [phone]; Fax: [phone]

## 2016-10-22 NOTE — Patient Instructions (Addendum)
Medication Instructions:  None  Labwork: Your physician recommends that you return for lab work for when you are fasting. (NMR, CBC w/ diff and TSH)   Testing/Procedures: Your physician has requested that you have a carotid duplex. This test is an ultrasound of the carotid arteries in your neck. It looks at blood flow through these arteries that supply the brain with blood. Allow one hour for this exam. There are no restrictions or special instructions.  Your physician has requested that you have an abdominal aorta duplex. During this test, an ultrasound is used to evaluate the aorta. Allow 30 minutes for this exam. Do not eat after midnight the day before and avoid carbonated beverages  Follow-Up: Your physician recommends that you schedule a follow-up appointment as needed with Dr. Tamala Julian.   Any Other Special Instructions Will Be Listed Below (If Applicable).     If you need a refill on your cardiac medications before your next appointment, please call your pharmacy.

## 2016-10-24 ENCOUNTER — Other Ambulatory Visit: Payer: PPO | Admitting: *Deleted

## 2016-10-24 DIAGNOSIS — E785 Hyperlipidemia, unspecified: Secondary | ICD-10-CM | POA: Diagnosis not present

## 2016-10-24 NOTE — Addendum Note (Signed)
Addended by: Eulis Foster on: 10/24/2016 09:04 AM   Modules accepted: Orders

## 2016-10-25 LAB — CBC WITH DIFFERENTIAL/PLATELET
BASOS ABS: 0 10*3/uL (ref 0.0–0.2)
Basos: 1 %
EOS (ABSOLUTE): 0.1 10*3/uL (ref 0.0–0.4)
Eos: 4 %
Hematocrit: 40.8 % (ref 34.0–46.6)
Hemoglobin: 13.5 g/dL (ref 11.1–15.9)
IMMATURE GRANS (ABS): 0 10*3/uL (ref 0.0–0.1)
IMMATURE GRANULOCYTES: 0 %
LYMPHS: 31 %
Lymphocytes Absolute: 0.9 10*3/uL (ref 0.7–3.1)
MCH: 31.8 pg (ref 26.6–33.0)
MCHC: 33.1 g/dL (ref 31.5–35.7)
MCV: 96 fL (ref 79–97)
Monocytes Absolute: 0.4 10*3/uL (ref 0.1–0.9)
Monocytes: 12 %
NEUTROS PCT: 52 %
Neutrophils Absolute: 1.6 10*3/uL (ref 1.4–7.0)
PLATELETS: 259 10*3/uL (ref 150–379)
RBC: 4.24 x10E6/uL (ref 3.77–5.28)
RDW: 13.8 % (ref 12.3–15.4)
WBC: 3 10*3/uL — ABNORMAL LOW (ref 3.4–10.8)

## 2016-10-25 LAB — LIPOPROTEIN ANALYSIS BY NMR
HDL Particle Number: 35.5 umol/L (ref >=30.5)
LDL Particle Number: 1410 nmol/L — ABNORMAL HIGH (ref <1000)
LDL Size: 21.3 nm (ref >20.5)
LP-IR Score: <25 (ref <=45)

## 2016-10-25 LAB — TSH: TSH: 1.3 u[IU]/mL (ref 0.450–4.500)

## 2016-10-28 ENCOUNTER — Encounter: Payer: Self-pay | Admitting: Interventional Cardiology

## 2016-10-29 ENCOUNTER — Telehealth: Payer: Self-pay | Admitting: Interventional Cardiology

## 2016-10-29 NOTE — Telephone Encounter (Signed)
Follow Up: ° ° ° °Returning your call from this morning. °

## 2016-10-29 NOTE — Telephone Encounter (Signed)
Informed pt of lab results. Pt verbalized understanding. 

## 2016-10-30 NOTE — Progress Notes (Signed)
Pre visit review using our clinic review tool, if applicable. No additional management support is needed unless otherwise documented below in the visit note.  Chief Complaint  Patient presents with  . Follow-up    HPI: Crystal Odom 76 y.o. come in for Chronic disease management   Fu BP  Incidental pulm nodules? Readings    At home 110 and 120 or so up in office personal.  machine correlateds  She has a blood pressure log reviewed and she feels well. Denies any cough or side effects of medication.  Had evaluation by Dr. Tamala Julian coronary artery calcium score of 0 and lipoprotein profile was favorable and particle size. Cyst that is not recommended at this time but getting some vascular studies to look at the bruit on exam.   Hearing evaluation at [facility] and then Dr. Lucia Gaskins for some mild asymmetrical hearing loss she is now hearing using hearing aids which has been helpful for her for voice discrimination.   ROS: See pertinent positives and negatives per HPI.  1. Lipids are as noted above. There is no evidence of significant vascular obstruction other than a suspected very faint left carotid bruit and a mildly pulsatile abdominal aorta with faint bruit heard. With a calcium score of 0 on CT scanning and her obvious markedly elevated HDL, I am reluctant to start statin therapy. If we find evidence of plaquing in the carotids or abdominal aorta by ultrasound scanning this could influence the decision. We will order a lipid NMR to determine particle size and number. If the lipid analysis is not atherogenic, we will likely not need to start therapy. 2. No evidence of stenosis. No further evaluation is needed. Echo did demonstrate grade 2 diastolic dysfunction but there is no evidence of volume overload or heart failure symptoms. 3. Bilateral carotid Doppler study. 4. Pulsatile abdominal aorta with faint bruit will be evaluated with an aortic ultrasound of the abdomen. 5.  low risk nmr  Can  take asa  No statin at thist  ime   IMPRESSION: 1. **An incidental finding of potential clinical significance has been found. 6 mm pulmonary nodule in the left lower lobe. Non-contrast chest CT at 6-12 months is recommended. If the nodule is stable at time of repeat CT, then future CT at 18-24 months (from today's scan) is considered optional for low-risk patients, but is recommended for high-risk patients. This recommendation follows the consensus statement: Guidelines for Management of Incidental Pulmonary Nodules Detected on CT Images:From the Fleischner Society 2017; published online before print (10.1148/radiol.SG:5268862).** 2. There are calcifications of the aortic valve. Echocardiographic correlation for evaluation of potential valvular dysfunction may be warranted if clinically indicated.  Electronically Signed: By: Vinnie Langton M.D. On: 06/11/2016 09:24  Past Medical History:  Diagnosis Date  . 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 Arthritis    left knee  . Diplopia 2002   eval neg  . Hyperlipidemia    HDL over 100  . Hypothyroidism   . Kidney stones    in past  . Leukopenia    with neg hemevaluation bm bx get wbc diff q 6 months  . UTI (lower urinary tract infection)     Family History  Problem Relation Age of Onset  . Other Father     major renal failure; SCCA in ear  . Osteoarthritis Father   . Heart disease Father   . Heart disease Mother  51  . Autoimmune disease Sister   . Autoimmune disease Brother   . Stroke Maternal Grandmother   . Thyroid disease    . Stroke    . Osteoporosis      Social History   Social History  . Marital status: Married    Spouse name: N/A  . Number of children: N/A  . Years of education: N/A   Social History Main Topics  . Smoking status: Never Smoker  . Smokeless tobacco: Never Used  . Alcohol use 1.2 - 1.8 oz/week    2 - 3 Glasses of wine per week      Comment: 2 to 3 glasses a week   . Drug use: No  . Sexual activity: Not Asked   Other Topics Concern  . None   Social History Narrative   Married   Retired  2008   Regular exercise-yes   HH of 2   No pets   Social etoh   G2P2      Husband hac CABG 2012   Travel grandchildren     Outpatient Medications Prior to Visit  Medication Sig Dispense Refill  . aspirin 81 MG tablet Take 81 mg by mouth daily.      . Cholecalciferol (VITAMIN D3) 1000 units CAPS Take 1 capsule by mouth daily.    . fish oil-omega-3 fatty acids 1000 MG capsule Take 1 g by mouth daily.     Marland Kitchen levothyroxine (SYNTHROID, LEVOTHROID) 88 MCG tablet TAKE 1 TABLET BY MOUTH ONCE DAILY 90 tablet 2  . lisinopril (PRINIVIL,ZESTRIL) 10 MG tablet Take 1 tablet (10 mg total) by mouth daily. 90 tablet 3   No facility-administered medications prior to visit.      EXAM:  BP 124/76 (BP Location: Left Arm, Patient Position: Sitting, Cuff Size: Normal)   Temp 97.8 F (36.6 C) (Oral)   Ht 5\' 6"  (1.676 m)   Wt 128 lb (58.1 kg)   BMI 20.66 kg/m   Body mass index is 20.66 kg/m.  GENERAL: vitals reviewed and listed above, alert, oriented, appears well hydrated and in no acute distress HEENT: atraumatic, conjunctiva  clear, no obvious abnormalities on inspection of external nose and earsMS: moves all extremities without noticeable focal  abnormality PSYCH: pleasant and cooperative, no obvious depression or anxiety Bp log reveiwed and data  Lab Results  Component Value Date   WBC 3.0 (L) 10/24/2016   HGB 13.6 05/09/2016   HCT 40.8 10/24/2016   PLT 259 10/24/2016   GLUCOSE 80 05/09/2016   CHOL 291 (H) 05/09/2016   TRIG 47.0 05/09/2016   HDL 114.60 05/09/2016   LDLDIRECT 149.6 02/02/2013   LDLCALC 167 (H) 05/09/2016   ALT 29 05/09/2016   AST 28 05/09/2016   NA 140 05/09/2016   K 4.4 05/09/2016   CL 104 05/09/2016   CREATININE 0.62 05/09/2016   BUN 20 05/09/2016   CO2 29 05/09/2016   TSH 1.300 10/24/2016   INR  0.91 11/10/2009   BP Readings from Last 3 Encounters:  10/31/16 124/76  10/22/16 (!) 146/76  08/30/16 (!) 156/88    ASSESSMENT AND PLAN:  Discussed the following assessment and plan:  Essential hypertension - doing well good readings  at home no se of med continue  bmp today and if ok yearly - Plan: Basic metabolic panel  Pulmonary nodule - 67mm lll incidental finding - Plan: CT CHEST LIMITED WO CONTRAST  Aortic valve sclerosis  Hearing aid worn - mild mod hearing loss eval reviewed  data  Low risk  Plan limited ct for pulm nodule  In summer before check up -Patient advised to return or notify health care team  if  new concerns arise.  Patient Instructions  Continue lisinopril your blood pressure is in good control. Chemistry today will let you know results. If all okay see you for checkup in August. We'll put in order to get a follow-up limited CT about the pulmonary nodule for the summer before you come in for your visit. Continue healthy lifestyle I agree with Dr. Thompson Caul recommendation.    Standley Brooking. Tylar Merendino M.D.

## 2016-10-31 ENCOUNTER — Ambulatory Visit (INDEPENDENT_AMBULATORY_CARE_PROVIDER_SITE_OTHER): Payer: PPO | Admitting: Internal Medicine

## 2016-10-31 ENCOUNTER — Encounter: Payer: Self-pay | Admitting: Internal Medicine

## 2016-10-31 VITALS — BP 124/76 | Temp 97.8°F | Ht 66.0 in | Wt 128.0 lb

## 2016-10-31 DIAGNOSIS — I10 Essential (primary) hypertension: Secondary | ICD-10-CM

## 2016-10-31 DIAGNOSIS — I358 Other nonrheumatic aortic valve disorders: Secondary | ICD-10-CM

## 2016-10-31 DIAGNOSIS — Z974 Presence of external hearing-aid: Secondary | ICD-10-CM

## 2016-10-31 DIAGNOSIS — R911 Solitary pulmonary nodule: Secondary | ICD-10-CM | POA: Diagnosis not present

## 2016-10-31 LAB — BASIC METABOLIC PANEL
BUN: 19 mg/dL (ref 6–23)
CHLORIDE: 104 meq/L (ref 96–112)
CO2: 32 meq/L (ref 19–32)
Calcium: 9.5 mg/dL (ref 8.4–10.5)
Creatinine, Ser: 0.6 mg/dL (ref 0.40–1.20)
GFR: 104.04 mL/min (ref >60.00)
Glucose, Bld: 87 mg/dL (ref 70–99)
Potassium: 4.7 mEq/L (ref 3.5–5.1)
Sodium: 140 mEq/L (ref 135–145)

## 2016-10-31 NOTE — Patient Instructions (Signed)
Continue lisinopril your blood pressure is in good control. Chemistry today will let you know results. If all okay see you for checkup in August. We'll put in order to get a follow-up limited CT about the pulmonary nodule for the summer before you come in for your visit. Continue healthy lifestyle I agree with Dr. Thompson Caul recommendation.

## 2016-11-13 ENCOUNTER — Ambulatory Visit (HOSPITAL_COMMUNITY)
Admission: RE | Admit: 2016-11-13 | Discharge: 2016-11-13 | Disposition: A | Discharge status: Home / Self Care | Payer: PPO | Source: Ambulatory Visit | Attending: Interventional Cardiology | Admitting: Interventional Cardiology

## 2016-11-13 ENCOUNTER — Ambulatory Visit (HOSPITAL_COMMUNITY)
Admission: RE | Admit: 2016-11-13 | Discharge: 2016-11-13 | Disposition: A | Discharge status: Home / Self Care | Payer: PPO | Source: Ambulatory Visit | Attending: Cardiology | Admitting: Cardiology

## 2016-11-13 ENCOUNTER — Other Ambulatory Visit (HOSPITAL_COMMUNITY): Payer: Self-pay | Admitting: Family Medicine

## 2016-11-13 DIAGNOSIS — R0989 Other specified symptoms and signs involving the circulatory and respiratory systems: Secondary | ICD-10-CM

## 2016-11-13 DIAGNOSIS — I7789 Other specified disorders of arteries and arterioles: Secondary | ICD-10-CM

## 2016-11-13 DIAGNOSIS — I6523 Occlusion and stenosis of bilateral carotid arteries: Secondary | ICD-10-CM | POA: Diagnosis not present

## 2016-11-14 ENCOUNTER — Other Ambulatory Visit: Payer: PPO

## 2016-11-15 ENCOUNTER — Telehealth: Payer: Self-pay | Admitting: *Deleted

## 2016-11-15 NOTE — Telephone Encounter (Signed)
Pt notified of both Carotid and AAA results and findings by phone with verbal understanding. Pt was thankful for the good news. Pt also aware per Dr. Tamala Julian lipid therapy not required.

## 2016-11-20 ENCOUNTER — Encounter: Payer: Self-pay | Admitting: Interventional Cardiology

## 2016-12-14 ENCOUNTER — Encounter: Payer: Self-pay | Admitting: Internal Medicine

## 2017-02-04 DIAGNOSIS — M1611 Unilateral primary osteoarthritis, right hip: Secondary | ICD-10-CM | POA: Diagnosis not present

## 2017-02-04 DIAGNOSIS — S83281A Other tear of lateral meniscus, current injury, right knee, initial encounter: Secondary | ICD-10-CM | POA: Diagnosis not present

## 2017-02-05 DIAGNOSIS — H52223 Regular astigmatism, bilateral: Secondary | ICD-10-CM | POA: Diagnosis not present

## 2017-02-05 DIAGNOSIS — H25813 Combined forms of age-related cataract, bilateral: Secondary | ICD-10-CM | POA: Diagnosis not present

## 2017-02-05 DIAGNOSIS — H524 Presbyopia: Secondary | ICD-10-CM | POA: Diagnosis not present

## 2017-02-05 DIAGNOSIS — H1850 Unspecified hereditary corneal dystrophies: Secondary | ICD-10-CM | POA: Diagnosis not present

## 2017-02-05 DIAGNOSIS — H5203 Hypermetropia, bilateral: Secondary | ICD-10-CM | POA: Diagnosis not present

## 2017-02-24 ENCOUNTER — Other Ambulatory Visit: Payer: Self-pay | Admitting: Internal Medicine

## 2017-03-03 ENCOUNTER — Ambulatory Visit (INDEPENDENT_AMBULATORY_CARE_PROVIDER_SITE_OTHER)
Admission: RE | Admit: 2017-03-03 | Discharge: 2017-03-03 | Disposition: A | Discharge status: Home / Self Care | Payer: PPO | Source: Ambulatory Visit | Attending: Internal Medicine | Admitting: Internal Medicine

## 2017-03-03 DIAGNOSIS — R911 Solitary pulmonary nodule: Secondary | ICD-10-CM | POA: Diagnosis not present

## 2017-05-05 ENCOUNTER — Other Ambulatory Visit: Payer: Self-pay | Admitting: Internal Medicine

## 2017-05-05 DIAGNOSIS — Z1231 Encounter for screening mammogram for malignant neoplasm of breast: Secondary | ICD-10-CM

## 2017-05-14 ENCOUNTER — Other Ambulatory Visit: Payer: PPO

## 2017-05-23 ENCOUNTER — Other Ambulatory Visit: Payer: Self-pay | Admitting: Internal Medicine

## 2017-05-26 ENCOUNTER — Ambulatory Visit
Admission: RE | Admit: 2017-05-26 | Discharge: 2017-05-26 | Disposition: A | Discharge status: Home / Self Care | Payer: PPO | Source: Ambulatory Visit | Attending: Internal Medicine | Admitting: Internal Medicine

## 2017-05-26 DIAGNOSIS — Z1231 Encounter for screening mammogram for malignant neoplasm of breast: Secondary | ICD-10-CM

## 2017-05-27 NOTE — Progress Notes (Signed)
Chief Complaint  Patient presents with  . Annual Exam    HPI: Crystal Odom 76 y.o. comes in today for Preventive Medicare exam/ wellness visit .and other conditions hypertensive hyperlipidemia hypothyroidism leukopenia followed with blood count. And a remote history of abnormal LFTsw nl liver bx . A strong family history of autoimmune disease.  Has elev ldl but high hdl and cacs of 0   Saw dr Tamala Julian and felt not no benefit from lipid med .  Dose have mild a sclerosis but no hemodynamic   Effect.   On ACE and nl to low bp but no  Sx and able to exercise . No se of med  Had shingrix vaccine at [facility]  Topic Date Due  . INFLUENZA VACCINE  04/30/2017  . MAMMOGRAM  05/27/2019  . TETANUS/TDAP  02/03/2023  . COLONOSCOPY  06/02/2024  . DEXA SCAN  Completed  . PNA vac Low Risk Adult  Completed   Health Maintenance Review LIFESTYLE:  Exercise:  Walks  30 min per day and yoga 5 day . Tobacco/ETS: no Alcohol:  About 3 per week Sugar beverages:no Sleep: average  7-8  Drug use: no HH: 2 no pets    Hearing: had hearing aid    Vision:  No limitations at present . Last eye check UTD   Cataracts   Not yet surgical .   Safety:  Has smoke detector and wears seat belts.  No firearms. No excess sun exposure. Sees dentist regularly.  Falls:  no  Advance directive :  Reviewed  Has one.  Memory: Felt to be good  , no concern from her or her family.  Depression: No anhedonia unusual crying or depressive symptoms  Nutrition: Eats well balanced diet; adequate calcium and vitamin D. No swallowing chewing problems.  Injury: no major injuries in the last six months.  Other healthcare providers:  Reviewed today .  Social:  Lives with spouse married. No pets.   Preventive parameters: up-to-date  Reviewed   ADLS:   There are no problems or need for assistance  driving, feeding, obtaining food, dressing, toileting and bathing, managing money using phone. She is  independent.    ROS:  GEN/ HEENT: No fever, significant weight changes sweats headaches vision problems change x above  hearing changes, CV/ PULM; No chest pain shortness of breath cough, syncope,edema  change in exercise tolerance. GI /GU: No adominal pain, vomiting, change in bowel habits. No blood in the stool. No significant GU symptoms. SKIN/HEME: ,no acute skin rashes suspicious lesions or bleeding. No lymphadenopathy, nodules, masses.  NEURO/ PSYCH:  No neurologic signs such as weakness numbness. No depression anxiety. IMM/ Allergy: No unusual infections.  Allergy .   REST of 12 system review negative except as per HPI   Past Medical History:  Diagnosis Date  . 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 Arthritis    left knee  . Diplopia 2002   eval neg  . Hyperlipidemia    HDL over 100  . Hypothyroidism   . Kidney stones    in past  . Leukopenia    with neg hemevaluation bm bx get wbc diff q 6 months  . UTI (lower urinary tract infection)     Family History  Problem Relation Age of Onset  . Other Father        major renal failure; SCCA in ear  . Osteoarthritis Father   .  Heart disease Father   . Heart disease Mother        19  . Autoimmune disease Sister   . Autoimmune disease Brother   . Stroke Maternal Grandmother   . Thyroid disease Unknown   . Stroke Unknown   . Osteoporosis Unknown     Social History   Social History  . Marital status: Married    Spouse name: N/A  . Number of children: N/A  . Years of education: N/A   Social History Main Topics  . Smoking status: Never Smoker  . Smokeless tobacco: Never Used  . Alcohol use 1.2 - 1.8 oz/week    2 - 3 Glasses of wine per week     Comment: 2 to 3 glasses a week   . Drug use: No  . Sexual activity: Not Asked   Other Topics Concern  . None   Social History Narrative   Married   Retired  2008   Regular exercise-yes   [place] of 2   No pets   Social  etoh   G2P2      Husband hac CABG 2012   Travel grandchildren     Outpatient Encounter Prescriptions as of 05/28/2017  Medication Sig  . aspirin 81 MG tablet Take 81 mg by mouth daily.    Marland Kitchen levothyroxine (SYNTHROID, LEVOTHROID) 88 MCG tablet TAKE 1 TABLET BY MOUTH EVERY DAY  . lisinopril (PRINIVIL,ZESTRIL) 10 MG tablet Take 1 tablet (10 mg total) by mouth daily.  . [DISCONTINUED] Cholecalciferol (VITAMIN D3) 1000 units CAPS Take 1 capsule by mouth daily.  . [DISCONTINUED] fish oil-omega-3 fatty acids 1000 MG capsule Take 1 g by mouth daily.    No facility-administered encounter medications on file as of 05/28/2017.     EXAM:  BP 120/70 (BP Location: Right Arm, Patient Position: Sitting, Cuff Size: Normal)   Pulse (!) 55   Temp 98.3 F (36.8 C) (Oral)   Ht 5' 4.5" (1.638 m)   Wt 127 lb 12.8 oz (58 kg)   BMI 21.60 kg/m   Body mass index is 21.6 kg/m.  Physical Exam: Vital signs reviewed [id number] is a well-developed well-nourished alert cooperative   who appears stated age in no acute distress.  HEENT: normocephalic atraumatic , Eyes: PERRL EOM's full, conjunctiva clear, Nares: paten,t no deformity discharge or tenderness., Ears: no deformity EAC's clear TMs with normal landmarks. Mouth: clear OP, no lesions, edema.  Moist mucous membranes. Dentition in adequate repair. NECK: supple without masses, thyromegaly or bruits. CHEST/PULM:  Clear to auscultation and percussion breath sounds equal no wheeze , rales or rhonchi. No chest wall deformities or tenderness.Breast: normal by inspection . No dimpling, discharge, masses, tenderness or discharge . CV: PMI is nondisplaced, S1 S2 no gallops, faint sem usb non radiating  murmurs, rubs. Peripheral pulses are full without delay.No JVD .  ABDOMEN: Bowel sounds normal nontender  No guard or rebound, no hepato splenomegal no CVA tenderness.   Extremtities:  No clubbing cyanosis or edema, no acute joint swelling or redness no focal atrophy  some djd changes not acute  NEURO:  Oriented x3, cranial nerves 3-12 appear to be intact, no obvious focal weakness,gait within normal limits no abnormal reflexes or asymmetrical SKIN: No acute rashes normal turgor, color, no bruising or petechiae. PSYCH: Oriented, good eye contact, no obvious depression anxiety, cognition and judgment appear normal. LN: no cervical axillary inguinal adenopathy No noted deficits in memory, attention, and speech. Last echo mild lae  Diastolic dysfunction and  mild a sclerosis no acute findings of pap elevation   Lab Results  Component Value Date   WBC 3.0 (L) 10/24/2016   HGB 13.5 10/24/2016   HCT 40.8 10/24/2016   PLT 259 10/24/2016   GLUCOSE 87 10/31/2016   CHOL 291 (H) 05/09/2016   TRIG 47.0 05/09/2016   HDL 114.60 05/09/2016   LDLDIRECT 149.6 02/02/2013   LDLCALC 167 (H) 05/09/2016   ALT 29 05/09/2016   AST 28 05/09/2016   NA 140 10/31/2016   K 4.7 10/31/2016   CL 104 10/31/2016   CREATININE 0.60 10/31/2016   BUN 19 10/31/2016   CO2 32 10/31/2016   TSH 1.300 10/24/2016   INR 0.91 11/10/2009  The ASCVD Risk score (Goff DC Jr., et al., 2013) failed to calculate for the following reasons:   The valid HDL cholesterol range is 20 to 100 mg/dL  The ASCVD Risk score ([place]., et al., 2013) failed to calculate for the following reasons:   The valid HDL cholesterol range is 20 to 100 mg/dL  ASSESSMENT AND PLAN:  Discussed the following assessment and plan:  Visit for preventive health examination - Plan: Basic metabolic panel, CBC with Differential/Platelet, Hepatic function panel, Lipid panel, TSH  Medication management - Plan: Basic metabolic panel, CBC with Differential/Platelet, Hepatic function panel, Lipid panel, TSH  Essential hypertension - Plan: Basic metabolic panel, CBC with Differential/Platelet, Hepatic function panel, Lipid panel, TSH  Leukopenia, unspecified type - Plan: Basic metabolic panel, CBC with Differential/Platelet,  Hepatic function panel, Lipid panel, TSH  Hearing aid worn  Aortic valve sclerosis - Plan: Basic metabolic panel, CBC with Differential/Platelet, Hepatic function panel, Lipid panel, TSH  Hypothyroidism, unspecified type  Hyperlipidemia, unspecified hyperlipidemia type - Plan: Basic metabolic panel, CBC with Differential/Platelet, Hepatic function panel, Lipid panel, TSH  Agatston coronary artery calcium score less than 100  0  Disorder of bone and cartilage Doing quite well  Strong fam hx autoimmune but  No signs of problem clinically currently and will follow .  Continue lifestyle intervention healthy eating and exercise . Lab monitoring every 6-12 months or as needed .   Patient Care Team: Panosh, Standley Brooking, MD as PCP - Loni Beckwith, MD (Orthopedic Surgery) Unice Bailey, MD (Internal Medicine) Pyrtle, Lajuan Lines, MD as Consulting Physician (Gastroenterology) Pyrtle, Lajuan Lines, MD as Consulting Physician (Gastroenterology) Marica Otter, OD [facility])  Patient Instructions  Get dexa next year to follow osteopenia .   Flu vaccine  Today.      Will notify you  of labs when available. If bp too low and dizzy contat [place] and we can decrease the dose of lisinopril to 5 mg .        Preventive Care 83 Years and Older, Female Preventive care refers to lifestyle choices and visits with your health care provider that can promote health and wellness. What does preventive care include?  A yearly physical exam. This is also called an annual well check.  Dental exams once or twice a year.  Routine eye exams. Ask your health care provider how often you should have your eyes checked.  Personal lifestyle choices, including: ? Daily care of your teeth and gums. ? Regular physical activity. ? Eating a healthy diet. ? Avoiding tobacco and drug use. ? Limiting alcohol use. ? Practicing safe sex. ? Taking low-dose aspirin every day. ? Taking vitamin and mineral supplements as  recommended by your health care provider. What happens during an annual well check? The services and  screenings done by your health care provider during your annual well check will depend on your age, overall health, lifestyle risk factors, and family history of disease. Counseling Your health care provider may ask you questions about your:  Alcohol use.  Tobacco use.  Drug use.  Emotional well-being.  Home and relationship well-being.  Sexual activity.  Eating habits.  History of falls.  Memory and ability to understand (cognition).  Work and work Statistician.  Reproductive health.  Screening You may have the following tests or measurements:  Height, weight, and BMI.  Blood pressure.  Lipid and cholesterol levels. These may be checked every 5 years, or more frequently if you are over 74 years old.  Skin check.  Lung cancer screening. You may have this screening every year starting at age 35 if you have a 30-pack-year history of smoking and currently smoke or have quit within the past 15 years.  Fecal occult blood test (FOBT) of the stool. You may have this test every year starting at age 17.  Flexible sigmoidoscopy or colonoscopy. You may have a sigmoidoscopy every 5 years or a colonoscopy every 10 years starting at age 76.  Hepatitis C blood test.  Hepatitis B blood test.  Sexually transmitted disease (STD) testing.  Diabetes screening. This is done by checking your blood sugar (glucose) after you have not eaten for a while (fasting). You may have this done every 1-3 years.  Bone density scan. This is done to screen for osteoporosis. You may have this done starting at age 75.  Mammogram. This may be done every 1-2 years. Talk to your health care provider about how often you should have regular mammograms.  Talk with your health care provider about your test results, treatment options, and if necessary, the need for more tests. Vaccines Your health care  provider may recommend certain vaccines, such as:  Influenza vaccine. This is recommended every year.  Tetanus, diphtheria, and acellular pertussis (Tdap, Td) vaccine. You may need a Td booster every 10 years.  Varicella vaccine. You may need this if you have not been vaccinated.  Zoster vaccine. You may need this after age 88.  Measles, mumps, and rubella (MMR) vaccine. You may need at least one dose of MMR if you were born in 1957 or later. You may also need a second dose.  Pneumococcal 13-valent conjugate (PCV13) vaccine. One dose is recommended after age 40.  Pneumococcal polysaccharide (PPSV23) vaccine. One dose is recommended after age 110.  Meningococcal vaccine. You may need this if you have certain conditions.  Hepatitis A vaccine. You may need this if you have certain conditions or if you travel or work in places where you may be exposed to hepatitis A.  Hepatitis B vaccine. You may need this if you have certain conditions or if you travel or work in places where you may be exposed to hepatitis B.  Haemophilus influenzae type b (Hib) vaccine. You may need this if you have certain conditions.  Talk to your health care provider about which screenings and vaccines you need and how often you need them. This information is not intended to replace advice given to you by your health care provider. Make sure you discuss any questions you have with your health care provider. Document Released: 10/13/2015 Document Revised: 06/05/2016 Document Reviewed: 07/18/2015 Elsevier Interactive Patient Education  2017 [place]. Panosh M.D.

## 2017-05-28 ENCOUNTER — Ambulatory Visit (INDEPENDENT_AMBULATORY_CARE_PROVIDER_SITE_OTHER): Payer: PPO | Admitting: Internal Medicine

## 2017-05-28 ENCOUNTER — Encounter: Payer: Self-pay | Admitting: Internal Medicine

## 2017-05-28 VITALS — BP 120/70 | HR 55 | Temp 98.3°F | Ht 64.5 in | Wt 127.8 lb

## 2017-05-28 DIAGNOSIS — Z0000 Encounter for general adult medical examination without abnormal findings: Secondary | ICD-10-CM | POA: Diagnosis not present

## 2017-05-28 DIAGNOSIS — M949 Disorder of cartilage, unspecified: Secondary | ICD-10-CM

## 2017-05-28 DIAGNOSIS — I10 Essential (primary) hypertension: Secondary | ICD-10-CM | POA: Diagnosis not present

## 2017-05-28 DIAGNOSIS — E785 Hyperlipidemia, unspecified: Secondary | ICD-10-CM

## 2017-05-28 DIAGNOSIS — Z974 Presence of external hearing-aid: Secondary | ICD-10-CM | POA: Diagnosis not present

## 2017-05-28 DIAGNOSIS — M899 Disorder of bone, unspecified: Secondary | ICD-10-CM | POA: Diagnosis not present

## 2017-05-28 DIAGNOSIS — D72819 Decreased white blood cell count, unspecified: Secondary | ICD-10-CM | POA: Diagnosis not present

## 2017-05-28 DIAGNOSIS — I358 Other nonrheumatic aortic valve disorders: Secondary | ICD-10-CM | POA: Diagnosis not present

## 2017-05-28 DIAGNOSIS — E039 Hypothyroidism, unspecified: Secondary | ICD-10-CM | POA: Diagnosis not present

## 2017-05-28 DIAGNOSIS — R931 Abnormal findings on diagnostic imaging of heart and coronary circulation: Secondary | ICD-10-CM

## 2017-05-28 DIAGNOSIS — Z79899 Other long term (current) drug therapy: Secondary | ICD-10-CM | POA: Diagnosis not present

## 2017-05-28 DIAGNOSIS — Z23 Encounter for immunization: Secondary | ICD-10-CM | POA: Diagnosis not present

## 2017-05-28 LAB — LIPID PANEL
CHOL/HDL RATIO: 3
Cholesterol: 305 mg/dL — ABNORMAL HIGH (ref 0–200)
HDL: 113.1 mg/dL (ref >39.00)
LDL Cholesterol: 181 mg/dL — ABNORMAL HIGH (ref 0–99)
NONHDL: 191.72
Triglycerides: 52 mg/dL (ref 0.0–149.0)
VLDL: 10.4 mg/dL (ref 0.0–40.0)

## 2017-05-28 LAB — HEPATIC FUNCTION PANEL
ALK PHOS: 58 U/L (ref 39–117)
ALT: 29 U/L (ref 0–35)
AST: 27 U/L (ref 0–37)
Albumin: 4.3 g/dL (ref 3.5–5.2)
BILIRUBIN DIRECT: 0.1 mg/dL (ref 0.0–0.3)
TOTAL PROTEIN: 7 g/dL (ref 6.0–8.3)
Total Bilirubin: 0.5 mg/dL (ref 0.2–1.2)

## 2017-05-28 LAB — BASIC METABOLIC PANEL
BUN: 17 mg/dL (ref 6–23)
CHLORIDE: 104 meq/L (ref 96–112)
CO2: 30 mEq/L (ref 19–32)
CREATININE: 0.59 mg/dL (ref 0.40–1.20)
Calcium: 9.6 mg/dL (ref 8.4–10.5)
GFR: 105.91 mL/min (ref >60.00)
Glucose, Bld: 84 mg/dL (ref 70–99)
POTASSIUM: 3.9 meq/L (ref 3.5–5.1)
SODIUM: 140 meq/L (ref 135–145)

## 2017-05-28 LAB — CBC WITH DIFFERENTIAL/PLATELET
BASOS ABS: 0 10*3/uL (ref 0.0–0.1)
BASOS PCT: 1.2 % (ref 0.0–3.0)
EOS ABS: 0.1 10*3/uL (ref 0.0–0.7)
Eosinophils Relative: 3.1 % (ref 0.0–5.0)
HEMATOCRIT: 35.6 % — AB (ref 36.0–46.0)
HEMOGLOBIN: 12 g/dL (ref 12.0–15.0)
LYMPHS PCT: 30.2 % (ref 12.0–46.0)
Lymphs Abs: 1 10*3/uL (ref 0.7–4.0)
MCHC: 33.5 g/dL (ref 30.0–36.0)
MCV: 98 fl (ref 78.0–100.0)
MONOS PCT: 11.6 % (ref 3.0–12.0)
Monocytes Absolute: 0.4 10*3/uL (ref 0.1–1.0)
Neutro Abs: 1.8 10*3/uL (ref 1.4–7.7)
Neutrophils Relative %: 53.9 % (ref 43.0–77.0)
Platelets: 287 10*3/uL (ref 150.0–400.0)
RBC: 3.64 Mil/uL — ABNORMAL LOW (ref 3.87–5.11)
RDW: 14.9 % (ref 11.5–15.5)
WBC: 3.4 10*3/uL — AB (ref 4.0–10.5)

## 2017-05-28 LAB — TSH: TSH: 1.37 u[IU]/mL (ref 0.35–4.50)

## 2017-05-28 NOTE — Patient Instructions (Addendum)
Get dexa next year to follow osteopenia .   Flu vaccine  Today.      Will notify you  of labs when available. If bp too low and dizzy contat [place] and we can decrease the dose of lisinopril to 5 mg .        Preventive Care 90 Years and Older, Female Preventive care refers to lifestyle choices and visits with your health care provider that can promote health and wellness. What does preventive care include?  A yearly physical exam. This is also called an annual well check.  Dental exams once or twice a year.  Routine eye exams. Ask your health care provider how often you should have your eyes checked.  Personal lifestyle choices, including: ? Daily care of your teeth and gums. ? Regular physical activity. ? Eating a healthy diet. ? Avoiding tobacco and drug use. ? Limiting alcohol use. ? Practicing safe sex. ? Taking low-dose aspirin every day. ? Taking vitamin and mineral supplements as recommended by your health care provider. What happens during an annual well check? The services and screenings done by your health care provider during your annual well check will depend on your age, overall health, lifestyle risk factors, and family history of disease. Counseling Your health care provider may ask you questions about your:  Alcohol use.  Tobacco use.  Drug use.  Emotional well-being.  Home and relationship well-being.  Sexual activity.  Eating habits.  History of falls.  Memory and ability to understand (cognition).  Work and work Statistician.  Reproductive health.  Screening You may have the following tests or measurements:  Height, weight, and BMI.  Blood pressure.  Lipid and cholesterol levels. These may be checked every 5 years, or more frequently if you are over 80 years old.  Skin check.  Lung cancer screening. You may have this screening every year starting at age 23 if you have a 30-pack-year history of smoking and currently smoke or have quit  within the past 15 years.  Fecal occult blood test (FOBT) of the stool. You may have this test every year starting at age 29.  Flexible sigmoidoscopy or colonoscopy. You may have a sigmoidoscopy every 5 years or a colonoscopy every 10 years starting at age 35.  Hepatitis C blood test.  Hepatitis B blood test.  Sexually transmitted disease (STD) testing.  Diabetes screening. This is done by checking your blood sugar (glucose) after you have not eaten for a while (fasting). You may have this done every 1-3 years.  Bone density scan. This is done to screen for osteoporosis. You may have this done starting at age 29.  Mammogram. This may be done every 1-2 years. Talk to your health care provider about how often you should have regular mammograms.  Talk with your health care provider about your test results, treatment options, and if necessary, the need for more tests. Vaccines Your health care provider may recommend certain vaccines, such as:  Influenza vaccine. This is recommended every year.  Tetanus, diphtheria, and acellular pertussis (Tdap, Td) vaccine. You may need a Td booster every 10 years.  Varicella vaccine. You may need this if you have not been vaccinated.  Zoster vaccine. You may need this after age 2.  Measles, mumps, and rubella (MMR) vaccine. You may need at least one dose of MMR if you were born in 1957 or later. You may also need a second dose.  Pneumococcal 13-valent conjugate (PCV13) vaccine. One dose is recommended after  age 19.  Pneumococcal polysaccharide (PPSV23) vaccine. One dose is recommended after age 55.  Meningococcal vaccine. You may need this if you have certain conditions.  Hepatitis A vaccine. You may need this if you have certain conditions or if you travel or work in places where you may be exposed to hepatitis A.  Hepatitis B vaccine. You may need this if you have certain conditions or if you travel or work in places where you may be exposed  to hepatitis B.  Haemophilus influenzae type b (Hib) vaccine. You may need this if you have certain conditions.  Talk to your health care provider about which screenings and vaccines you need and how often you need them. This information is not intended to replace advice given to you by your health care provider. Make sure you discuss any questions you have with your health care provider. Document Released: 10/13/2015 Document Revised: 06/05/2016 Document Reviewed: 07/18/2015 Elsevier Interactive Patient Education  2017 [employer].

## 2017-05-28 NOTE — Addendum Note (Signed)
Addended by: Milford Cage on: 05/28/2017 11:35 AM   Modules accepted: Orders

## 2017-05-29 DIAGNOSIS — H00024 Hordeolum internum left upper eyelid: Secondary | ICD-10-CM | POA: Diagnosis not present

## 2017-06-20 ENCOUNTER — Encounter: Payer: Self-pay | Admitting: Internal Medicine

## 2017-08-19 ENCOUNTER — Other Ambulatory Visit: Payer: Self-pay | Admitting: Internal Medicine

## 2017-08-20 ENCOUNTER — Other Ambulatory Visit: Payer: Self-pay | Admitting: Internal Medicine

## 2017-09-16 DIAGNOSIS — S83281D Other tear of lateral meniscus, current injury, right knee, subsequent encounter: Secondary | ICD-10-CM | POA: Diagnosis not present

## 2017-09-16 DIAGNOSIS — S83241D Other tear of medial meniscus, current injury, right knee, subsequent encounter: Secondary | ICD-10-CM | POA: Diagnosis not present

## 2018-01-22 DIAGNOSIS — S83241D Other tear of medial meniscus, current injury, right knee, subsequent encounter: Secondary | ICD-10-CM | POA: Diagnosis not present

## 2018-01-22 DIAGNOSIS — S83281D Other tear of lateral meniscus, current injury, right knee, subsequent encounter: Secondary | ICD-10-CM | POA: Diagnosis not present

## 2018-02-18 ENCOUNTER — Other Ambulatory Visit: Payer: Self-pay | Admitting: Internal Medicine

## 2018-03-12 DIAGNOSIS — M7581 Other shoulder lesions, right shoulder: Secondary | ICD-10-CM | POA: Diagnosis not present

## 2018-03-25 ENCOUNTER — Encounter: Payer: Self-pay | Admitting: Internal Medicine

## 2018-03-25 NOTE — Telephone Encounter (Signed)
Please advise Dr Regis Bill, thanks.  Pt having low BP readings. Wants to know if she needs to stop her BP Medication, Lisinopril.  Readings are in E-mail message attached

## 2018-03-26 NOTE — Telephone Encounter (Signed)
If below 100 then stop the medication  Lisinopril and  Make OV and bring in machine again with readings. ( next week ok )

## 2018-03-27 NOTE — Progress Notes (Signed)
Chief Complaint  Patient presents with  . Medication Management    bp  was low see my chart messages     HPI: Crystal Odom 77 y.o. come in for bp issues   Was low afer donating blood   Pre  Readings were 118 and 120    After  80 range an dfelt lightheaded fora bout  10+ days   See my chart message so went off  As instructed and bp up to 120 - 140 and restarted yesterday   bp today 117   Machine seems to correlated with [facility]  And ours  No cps ob event  That would cause dissiness and low bp ROS: See pertinent positives and negatives per HPI. No cp sob tachy card irreg hb  Past Medical History:  Diagnosis Date  . 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 Arthritis    left knee  . Diplopia 2002   eval neg  . Hyperlipidemia    HDL over 100  . Hypothyroidism   . Kidney stones    in past  . Leukopenia    with neg hemevaluation bm bx get wbc diff q 6 months  . UTI (lower urinary tract infection)     Family History  Problem Relation Age of Onset  . Other Father        major renal failure; SCCA in ear  . Osteoarthritis Father   . Heart disease Father   . Heart disease Mother        67  . Autoimmune disease Sister   . Autoimmune disease Brother   . Stroke Maternal Grandmother   . Thyroid disease Unknown   . Stroke Unknown   . Osteoporosis Unknown     Social History   Socioeconomic History  . Marital status: Married    Spouse name: Not on file  . Number of children: Not on file  . Years of education: Not on file  . Highest education level: Not on file  Occupational History  . Not on file  Social Needs  . Financial resource strain: Not on file  . Food insecurity:    Worry: Not on file    Inability: Not on file  . Transportation needs:    Medical: Not on file    Non-medical: Not on file  Tobacco Use  . Smoking status: Never Smoker  . Smokeless tobacco: Never Used  Substance and Sexual Activity  . Alcohol  use: Yes    Alcohol/week: 1.2 - 1.8 oz    Types: 2 - 3 Glasses of wine per week    Comment: 2 to 3 glasses a week   . Drug use: No  . Sexual activity: Not on file  Lifestyle  . Physical activity:    Days per week: Not on file    Minutes per session: Not on file  . Stress: Not on file  Relationships  . Social connections:    Talks on phone: Not on file    Gets together: Not on file    Attends religious service: Not on file    Active member of club or organization: Not on file    Attends meetings of clubs or organizations: Not on file    Relationship status: Not on file  Other Topics Concern  . Not on file  Social History Narrative   Married   Retired  2008   Regular exercise-yes   [place] of 2  No pets   Social etoh   G2P2      Husband hac CABG 2012   Travel grandchildren     Outpatient Medications Prior to Visit  Medication Sig Dispense Refill  . aspirin 81 MG tablet Take 81 mg by mouth daily.      Marland Kitchen levothyroxine (SYNTHROID, LEVOTHROID) 88 MCG tablet TAKE 1 TABLET BY MOUTH EVERY DAY 90 tablet 0  . lisinopril (PRINIVIL,ZESTRIL) 10 MG tablet TAKE 1 TABLET BY MOUTH EVERY DAY 90 tablet 2   No facility-administered medications prior to visit.      EXAM:  BP 120/60 (BP Location: Right Arm, Patient Position: Sitting, Cuff Size: Normal)   Pulse 68   Temp 98.6 F (37 C)   Wt 125 lb (56.7 kg)   BMI 21.12 kg/m   Body mass index is 21.12 kg/m.  GENERAL: vitals reviewed and listed above, alert, oriented, appears well hydrated and in no acute distress HEENT: atraumatic, conjunctiva  clear, no obvious abnormalities on inspection of external nose and ears  NECK: no obvious masses on inspection palpation  LUNGS: clear to auscultation bilaterally, no wheezes, rales or rhonchi, good air movement CV: HRRR, no clubbing cyanosis or  peripheral edema nl cap refill  No  Murmur  MS: moves all extremities without noticeable focal  abnormality PSYCH: pleasant and cooperative, no  obvious depression or anxiety Lab Results  Component Value Date   WBC 3.4 (L) 05/28/2017   HGB 12.0 05/28/2017   HCT 35.6 (L) 05/28/2017   PLT 287.0 05/28/2017   GLUCOSE 84 05/28/2017   CHOL 305 (H) 05/28/2017   TRIG 52.0 05/28/2017   HDL 113.10 05/28/2017   LDLDIRECT 149.6 02/02/2013   LDLCALC 181 (H) 05/28/2017   ALT 29 05/28/2017   AST 27 05/28/2017   NA 140 05/28/2017   K 3.9 05/28/2017   CL 104 05/28/2017   CREATININE 0.59 05/28/2017   BUN 17 05/28/2017   CO2 30 05/28/2017   TSH 1.37 05/28/2017   INR 0.91 11/10/2009   BP Readings from Last 3 Encounters:  03/30/18 120/60  05/28/17 120/70  10/31/16 124/76    ASSESSMENT AND PLAN:  Discussed the following assessment and plan:  Essential hypertension  Medication management  Low blood pressure reading Low bp after blood donation  Advise  Stop parameters and  Can dec dose to 5 mg per day  To maintain control without  Sx   caution in low hydration situations . Stop if needed  Keep cpx appts and fu my chart with problems   -Patient advised to return or notify health care team  if  new concerns arise.  Patient Instructions  Would decrease  Med to 5 mg per day .  However if  100 and below stop the medicine and  Watch .  The readings  .  Stay hydrated .          Standley Brooking. Panosh M.D.

## 2018-03-30 ENCOUNTER — Ambulatory Visit (INDEPENDENT_AMBULATORY_CARE_PROVIDER_SITE_OTHER): Payer: PPO | Admitting: Internal Medicine

## 2018-03-30 ENCOUNTER — Encounter: Payer: Self-pay | Admitting: Internal Medicine

## 2018-03-30 ENCOUNTER — Ambulatory Visit: Payer: PPO | Admitting: Family Medicine

## 2018-03-30 VITALS — BP 120/60 | HR 68 | Temp 98.6°F | Wt 125.0 lb

## 2018-03-30 DIAGNOSIS — I10 Essential (primary) hypertension: Secondary | ICD-10-CM | POA: Diagnosis not present

## 2018-03-30 DIAGNOSIS — Z79899 Other long term (current) drug therapy: Secondary | ICD-10-CM

## 2018-03-30 DIAGNOSIS — R031 Nonspecific low blood-pressure reading: Secondary | ICD-10-CM | POA: Diagnosis not present

## 2018-03-30 MED ORDER — LISINOPRIL 5 MG PO TABS: 5.0000 mg | ORAL_TABLET | Freq: Every day | ORAL | 3 refills | Status: DC

## 2018-03-30 NOTE — Patient Instructions (Addendum)
Would decrease  Med to 5 mg per day .  However if  100 and below stop the medicine and  Watch .  The readings  .  Stay hydrated .

## 2018-04-21 NOTE — Progress Notes (Addendum)
Subjective:   Crystal Odom is a 77 y.o. female who presents for Medicare Annual (Subsequent) preventive examination.  Reports health as good  Seen Dr. Regis Bill 03/30/2018  - c/0 of low bp BP 136 80 dropped Lisinopril to '5mg'$    Diet BMI 20  Chol/hdl ratio 3  Heart healthy, discussed decreasing sugar; watching fats. Given information on high cholesterol food;  Trying to eat health; (when spouse had heart issue x 71 yo)  Cut back on sodium and continues to monitor    Exercise Weight bearing; 25 to 30 minutes every day Yoga; about 5 to 6 days a week  Helps with balance and strength  Due to much core work  [employer] every am do more  Will add some weights to her exercise  Will go low and slow with any injury (right shoulder or right knee)   Cardiac Risk Factors include: advanced age (>38mn, >>28women);hypertension;family history of premature cardiovascular disease There are no preventive care reminders to display for this patient.   Mammogram 04/2017 dexa 05/2015 -1.4  Tries to get calcium in her food      Objective:     Vitals: BP 136/80   Pulse (!) 58   Ht 5' 5.5" (1.664 m)   Wt 126 lb 9 oz (57.4 kg)   SpO2 96%   BMI 20.74 kg/m   Body mass index is 20.74 kg/m.  Advanced Directives 04/22/2018 07/05/2016  Does Patient Have a Medical Advance Directive? Yes Yes    Tobacco Social History   Tobacco Use  Smoking Status Never Smoker  Smokeless Tobacco Never Used     Counseling given: Yes   Clinical Intake:   Past Medical History:  Diagnosis Date  . 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 Arthritis    left knee  . Diplopia 2002   eval neg  . Hyperlipidemia    HDL over 100  . Hypothyroidism   . Kidney stones    in past  . Leukopenia    with neg hemevaluation bm bx get wbc diff q 6 months  . UTI (lower urinary tract infection)    Past Surgical History:  Procedure Laterality Date  . BONE MARROW BIOPSY     low WBC  . kidney stone removal    . left arthroscopic  2005   left knee  . LIVER BIOPSY     reported normal  . TONSILLECTOMY    . TUBAL LIGATION     Family History  Problem Relation Age of Onset  . Other Father        major renal failure; SCCA in ear  . Osteoarthritis Father   . Heart disease Father   . Heart disease Mother        739 . Autoimmune disease Sister   . Autoimmune disease Brother   . Stroke Maternal Grandmother   . Thyroid disease Unknown   . Stroke Unknown   . Osteoporosis Unknown    Social History   Socioeconomic History  . Marital status: Married    Spouse name: Not on file  . Number of children: Not on file  . Years of education: Not on file  . Highest education level: Not on file  Occupational History  . Not on file  Social Needs  . Financial resource strain: Not on file  . Food insecurity:    Worry: Not on file    Inability: Not on file  .  Transportation needs:    Medical: Not on file    Non-medical: Not on file  Tobacco Use  . Smoking status: Never Smoker  . Smokeless tobacco: Never Used  Substance and Sexual Activity  . Alcohol use: Yes    Alcohol/week: 1.2 - 1.8 oz    Types: 2 - 3 Glasses of wine per week    Comment: 2 to 3 glasses a week   . Drug use: No  . Sexual activity: Not on file  Lifestyle  . Physical activity:    Days per week: Not on file    Minutes per session: Not on file  . Stress: Not on file  Relationships  . Social connections:    Talks on phone: Not on file    Gets together: Not on file    Attends religious service: Not on file    Active member of club or organization: Not on file    Attends meetings of clubs or organizations: Not on file    Relationship status: Not on file  Other Topics Concern  . Not on file  Social History Narrative   Married   Retired  2008   Regular exercise-yes   [place] of 2   No pets   Social etoh   G2P2      Husband hac CABG 2012   Travel grandchildren     Outpatient Encounter  Medications as of 04/22/2018  Medication Sig  . aspirin 81 MG tablet Take 81 mg by mouth daily.    Marland Kitchen levothyroxine (SYNTHROID, LEVOTHROID) 88 MCG tablet TAKE 1 TABLET BY MOUTH EVERY DAY  . lisinopril (PRINIVIL,ZESTRIL) 5 MG tablet Take 1 tablet (5 mg total) by mouth daily.   No facility-administered encounter medications on file as of 04/22/2018.     Activities of Daily Living In your present state of health, do you have any difficulty performing the following activities: 04/22/2018  Hearing? N  Vision? N  Difficulty concentrating or making decisions? N  Walking or climbing stairs? N  Dressing or bathing? N  Doing errands, shopping? N  Preparing Food and eating ? N  Using the Toilet? N  In the past six months, have you accidently leaked urine? N  Do you have problems with loss of bowel control? N  Managing your Medications? N  Managing your Finances? N  Housekeeping or managing your Housekeeping? N  Some recent data might be hidden    Patient Care Team: Panosh, Standley Brooking, MD as PCP - General Elsie Saas, MD (Orthopedic Surgery) Unice Bailey, MD (Internal Medicine) Pyrtle, Lajuan Lines, MD as Consulting Physician (Gastroenterology) Pyrtle, Lajuan Lines, MD as Consulting Physician (Gastroenterology) Marica Otter, OD (Optometry)    Assessment:   This is a routine wellness examination for [name].  Exercise Activities and Dietary recommendations Current Exercise Habits: Structured exercise class, Type of exercise: strength training/weights;stretching;Other - see comments, Time (Minutes): 60, Frequency (Times/Week): 5, Weekly Exercise (Minutes/Week): 300, Intensity: Moderate  Goals    . Patient Stated     Will add on resistance work or strength training  Go slow and low and get help at first        [name]  04/22/2018 07/05/2016 05/14/2016 05/03/2015 05/03/2015  Falls in the past year? No Yes Yes (No Data) Yes  Comment - - - distractions shoes laces  untied  hit head.  -    Number falls in past yr: - - 1 1 -  Comment - - - - -  Injury  with Fall? - - Yes Yes -  Comment - - sprained ankles  playin giwth [employer] stairs able to hike laceration -  Risk for fall due to : - - Other (Comment) - -     Depression Screen PHQ 2/9 Scores 04/22/2018 07/05/2016 05/14/2016 05/03/2015  PHQ - 2 Score 0 0 0 0     Cognitive Function MMSE - Mini Mental State Exam 07/05/2016  Not completed: (No Data)     Ad8 score reviewed for issues:  Issues making decisions:  Less interest in hobbies / activities:  Repeats questions, stories (family complaining):  Trouble using ordinary gadgets (microwave, computer, phone):  Forgets the month or year:   Mismanaging finances:   Remembering appts:  Daily problems with thinking and/or memory: Ad8 score is=0        Immunization History  Administered Date(s) Administered  . Hep A / Hep B 12/28/2009, 02/01/2010  . Influenza Split 06/17/2013  . Influenza Whole 08/10/2007, 07/07/2008  . Influenza, High Dose Seasonal PF 07/13/2014, 07/05/2016, 05/28/2017  . Influenza-Unspecified 06/28/2015  . Pneumococcal Conjugate-13 05/02/2014  . Pneumococcal Polysaccharide-23 07/19/2008  . Td 10/31/2002  . Tdap 02/02/2013  . Zoster 08/10/2007     Screening Tests Health Maintenance  Topic Date Due  . INFLUENZA VACCINE  04/30/2018  . MAMMOGRAM  05/27/2019  . TETANUS/TDAP  02/03/2023  . COLONOSCOPY  06/02/2024  . DEXA SCAN  Completed  . PNA vac Low Risk Adult  Completed       Plan:      PCP Notes   Health Maintenance Mammogram 04/2017 dexa 05/2015 -1.4 ( scheduled dexa this year with mammogram )  Tries to get calcium in her food   Has taken shingrix; will bring dates of these injections to the next OV   Abnormal Screens  none   Referrals  none  Patient concerns; BP 136 80 dropped Lisinopril to '5mg'$   Had been hypotensive  130 80 at the end of the visit No more light headedness recently  Will continue to check her BP  prior to her OV in Sept ( HR noted to be reg- irreg but no issues noted with the patient)  Had cardiac screen recently    Nurse Concerns; As noted  Next PCP apt 06/02/2018     I have personally reviewed and noted the following in the patient's chart:   . Medical and social history . Use of alcohol, tobacco or illicit drugs  . Current medications and supplements . Functional ability and status . Nutritional status . Physical activity . Advanced directives . List of other physicians . Hospitalizations, surgeries, and ER visits in previous 12 months . Vitals . Screenings to include cognitive, depression, and falls . Referrals and appointments  In addition, I have reviewed and discussed with patient certain preventive protocols, quality metrics, and best practice recommendations. A written personalized care plan for preventive services as well as general preventive health recommendations were provided to patient.     YDXAJ,OINOM, RN  04/22/2018  Above noted reviewed and agree. Shanon Ace, MD

## 2018-04-22 ENCOUNTER — Ambulatory Visit (INDEPENDENT_AMBULATORY_CARE_PROVIDER_SITE_OTHER): Payer: PPO

## 2018-04-22 VITALS — BP 136/80 | HR 58 | Ht 65.5 in | Wt 126.6 lb

## 2018-04-22 DIAGNOSIS — Z0000 Encounter for general adult medical examination without abnormal findings: Secondary | ICD-10-CM | POA: Diagnosis not present

## 2018-04-22 DIAGNOSIS — E2839 Other primary ovarian failure: Secondary | ICD-10-CM

## 2018-04-22 NOTE — Patient Instructions (Addendum)
Crystal Odom , Thank you for taking time to come for your Medicare Wellness Visit. I appreciate your ongoing commitment to your health goals. Please review the following plan we discussed and let me know if I can assist you in the future.   Please get the dates of your shingrix vaccines for Dr. Regis Bill   Recommendations for Dexa Scan Female over the age of 53 Man age 75 or older If you broke a bone past the age of 10 Women menopausal age with risk factors (thin frame; smoker; hx of fx ) Post menopausal women under the age of 45 with risk factors A man age 12 to 30 with risk factors Other: Spine xray that is showing break of bone loss Back pain with possible break Height loss of 1/2 inch or more within one year Total loss in height of 1.5 inches from your original height  Calcium '1200mg'$  with Vit D 800u per day; more as directed by physician Strength building exercises discussed; can include walking; housework; small weights or stretch bands; silver sneakers if [facility] to the [facility]  Please visit the osteoporosis foundation.org for up to date recommendations  Have Dr. Regis Bill check your skin    These are the goals we discussed: Goals    . Patient Stated     Will add on resistance work or strength training  Go slow and low and get help at first        This is a list of the screening recommended for you and due dates:  Health Maintenance  Topic Date Due  . Flu Shot  04/30/2018  . Mammogram  05/27/2019  . Tetanus Vaccine  02/03/2023  . Colon Cancer Screening  06/02/2024  . DEXA scan (bone density measurement)  Completed  . Pneumonia vaccines  Completed      Fall Prevention in the Home Falls can cause injuries. They can happen to people of all ages. There are many things you can do to make your home safe and to help prevent falls. What can I do on the outside of my home?  Regularly fix the edges of walkways and driveways and fix any cracks.  Remove anything that might make  you trip as you walk through a door, such as a raised step or threshold.  Trim any bushes or trees on the path to your home.  Use bright outdoor lighting.  Clear any walking paths of anything that might make someone trip, such as rocks or tools.  Regularly check to see if handrails are loose or broken. Make sure that both sides of any steps have handrails.  Any raised decks and porches should have guardrails on the edges.  Have any leaves, snow, or ice cleared regularly.  Use sand or salt on walking paths during winter.  Clean up any spills in your garage right away. This includes oil or grease spills. What can I do in the bathroom?  Use night lights.  Install grab bars by the toilet and in the tub and shower. Do not use towel bars as grab bars.  Use non-skid mats or decals in the tub or shower.  If you need to sit down in the shower, use a plastic, non-slip stool.  Keep the floor dry. Clean up any water that spills on the floor as soon as it happens.  Remove soap buildup in the tub or shower regularly.  Attach bath mats securely with double-sided non-slip rug tape.  Do not have throw rugs and other things  on the floor that can make you trip. What can I do in the bedroom?  Use night lights.  Make sure that you have a light by your bed that is easy to reach.  Do not use any sheets or blankets that are too big for your bed. They should not hang down onto the floor.  Have a firm chair that has side arms. You can use this for support while you get dressed.  Do not have throw rugs and other things on the floor that can make you trip. What can I do in the kitchen?  Clean up any spills right away.  Avoid walking on wet floors.  Keep items that you use a lot in easy-to-reach places.  If you need to reach something above you, use a strong step stool that has a grab bar.  Keep electrical cords out of the way.  Do not use floor polish or wax that makes floors slippery.  If you must use wax, use non-skid floor wax.  Do not have throw rugs and other things on the floor that can make you trip. What can I do with my stairs?  Do not leave any items on the stairs.  Make sure that there are handrails on both sides of the stairs and use them. Fix handrails that are broken or loose. Make sure that handrails are as long as the stairways.  Check any carpeting to make sure that it is firmly attached to the stairs. Fix any carpet that is loose or worn.  Avoid having throw rugs at the top or bottom of the stairs. If you do have throw rugs, attach them to the floor with carpet tape.  Make sure that you have a light switch at the top of the stairs and the bottom of the stairs. If you do not have them, ask someone to add them for you. What else can I do to help prevent falls?  Wear shoes that: ? Do not have high heels. ? Have rubber bottoms. ? Are comfortable and fit you well. ? Are closed at the toe. Do not wear sandals.  If you use a stepladder: ? Make sure that it is fully opened. Do not climb a closed stepladder. ? Make sure that both sides of the stepladder are locked into place. ? Ask someone to hold it for you, if possible.  Clearly mark and make sure that you can see: ? Any grab bars or handrails. ? First and last steps. ? Where the edge of each step is.  Use tools that help you move around (mobility aids) if they are needed. These include: ? Canes. ? Walkers. ? Scooters. ? Crutches.  Turn on the lights when you go into a dark area. Replace any light bulbs as soon as they burn out.  Set up your furniture so you have a clear path. Avoid moving your furniture around.  If any of your floors are uneven, fix them.  If there are any pets around you, be aware of where they are.  Review your medicines with your doctor. Some medicines can make you feel dizzy. This can increase your chance of falling. Ask your doctor what other things that you can do to  help prevent falls. This information is not intended to replace advice given to you by your health care provider. Make sure you discuss any questions you have with your health care provider. Document Released: 07/13/2009 Document Revised: 02/22/2016 Document Reviewed: 10/21/2014 Elsevier Interactive Patient Education  2018 [place] Maintenance, Female Adopting a healthy lifestyle and getting preventive care can go a long way to promote health and wellness. Talk with your health care provider about what schedule of regular examinations is right for you. This is a good chance for you to check in with your provider about disease prevention and staying healthy. In between checkups, there are plenty of things you can do on your own. Experts have done a lot of research about which lifestyle changes and preventive measures are most likely to keep you healthy. Ask your health care provider for more information. Weight and diet Eat a healthy diet  Be sure to include plenty of vegetables, fruits, low-fat dairy products, and lean protein.  Do not eat a lot of foods high in solid fats, added sugars, or salt.  Get regular exercise. This is one of the most important things you can do for your health. ? Most adults should exercise for at least 150 minutes each week. The exercise should increase your heart rate and make you sweat (moderate-intensity exercise). ? Most adults should also do strengthening exercises at least twice a week. This is in addition to the moderate-intensity exercise.  Maintain a healthy weight  Body mass index (BMI) is a measurement that can be used to identify possible weight problems. It estimates body fat based on height and weight. Your health care provider can help determine your BMI and help you achieve or maintain a healthy weight.  For females 54 years of age and older: ? A BMI below 18.5 is considered underweight. ? A BMI of 18.5 to 24.9 is normal. ? A BMI of  25 to 29.9 is considered overweight. ? A BMI of 30 and above is considered obese.  Watch levels of cholesterol and blood lipids  You should start having your blood tested for lipids and cholesterol at 75 years of age, then have this test every 5 years.  You may need to have your cholesterol levels checked more often if: ? Your lipid or cholesterol levels are high. ? You are older than 75 years of age. ? You are at high risk for heart disease.  Cancer screening Lung Cancer  Lung cancer screening is recommended for adults 6-22 years old who are at high risk for lung cancer because of a history of smoking.  A yearly low-dose CT scan of the lungs is recommended for people who: ? Currently smoke. ? Have quit within the past 15 years. ? Have at least a 30-pack-year history of smoking. A pack year is smoking an average of one pack of cigarettes a day for 1 year.  Yearly screening should continue until it has been 15 years since you quit.  Yearly screening should stop if you develop a health problem that would prevent you from having lung cancer treatment.  Breast Cancer  Practice breast self-awareness. This means understanding how your breasts normally appear and feel.  It also means doing regular breast self-exams. Let your health care provider know about any changes, no matter how small.  If you are in your 20s or 30s, you should have a clinical breast exam (CBE) by a health care provider every 1-3 years as part of a regular health exam.  If you are 64 or older, have a CBE every year. Also consider having a breast X-ray (mammogram) every year.  If you have a family history of breast cancer, talk to your health care provider about genetic screening.  If you  are at high risk for breast cancer, talk to your health care provider about having an MRI and a mammogram every year.  Breast cancer gene (BRCA) assessment is recommended for women who have family members with BRCA-related  cancers. BRCA-related cancers include: ? Breast. ? Ovarian. ? Tubal. ? Peritoneal cancers.  Results of the assessment will determine the need for genetic counseling and BRCA1 and BRCA2 testing.  Cervical Cancer Your health care provider may recommend that you be screened regularly for cancer of the pelvic organs (ovaries, uterus, and vagina). This screening involves a pelvic examination, including checking for microscopic changes to the surface of your cervix (Pap test). You may be encouraged to have this screening done every 3 years, beginning at age 53.  For women ages 66-65, health care providers may recommend pelvic exams and Pap testing every 3 years, or they may recommend the Pap and pelvic exam, combined with testing for human papilloma virus (HPV), every 5 years. Some types of HPV increase your risk of cervical cancer. Testing for HPV may also be done on women of any age with unclear Pap test results.  Other health care providers may not recommend any screening for nonpregnant women who are considered low risk for pelvic cancer and who do not have symptoms. Ask your health care provider if a screening pelvic exam is right for you.  If you have had past treatment for cervical cancer or a condition that could lead to cancer, you need Pap tests and screening for cancer for at least 20 years after your treatment. If Pap tests have been discontinued, your risk factors (such as having a new sexual partner) need to be reassessed to determine if screening should resume. Some women have medical problems that increase the chance of getting cervical cancer. In these cases, your health care provider may recommend more frequent screening and Pap tests.  Colorectal Cancer  This type of cancer can be detected and often prevented.  Routine colorectal cancer screening usually begins at 75 years of age and continues through 75 years of age.  Your health care provider may recommend screening at an  earlier age if you have risk factors for colon cancer.  Your health care provider may also recommend using home test kits to check for hidden blood in the stool.  A small camera at the end of a tube can be used to examine your colon directly (sigmoidoscopy or colonoscopy). This is done to check for the earliest forms of colorectal cancer.  Routine screening usually begins at age 64.  Direct examination of the colon should be repeated every 5-10 years through 75 years of age. However, you may need to be screened more often if early forms of precancerous polyps or small growths are found.  Skin Cancer  Check your skin from head to toe regularly.  Tell your health care provider about any new moles or changes in moles, especially if there is a change in a mole's shape or color.  Also tell your health care provider if you have a mole that is larger than the size of a pencil eraser.  Always use sunscreen. Apply sunscreen liberally and repeatedly throughout the day.  Protect yourself by wearing long sleeves, pants, a wide-brimmed hat, and sunglasses whenever you are outside.  Heart disease, diabetes, and high blood pressure  High blood pressure causes heart disease and increases the risk of stroke. High blood pressure is more likely to develop in: ? People who have blood pressure in  the high end of the normal range (130-139/85-89 mm Hg). ? People who are overweight or obese. ? People who are African American.  If you are 22-67 years of age, have your blood pressure checked every 3-5 years. If you are 82 years of age or older, have your blood pressure checked every year. You should have your blood pressure measured twice-once when you are at a hospital or clinic, and once when you are not at a hospital or clinic. Record the average of the two measurements. To check your blood pressure when you are not at a hospital or clinic, you can use: ? An automated blood pressure machine at a  pharmacy. ? A home blood pressure monitor.  If you are between 27 years and 85 years old, ask your health care provider if you should take aspirin to prevent strokes.  Have regular diabetes screenings. This involves taking a blood sample to check your fasting blood sugar level. ? If you are at a normal weight and have a low risk for diabetes, have this test once every three years after 75 years of age. ? If you are overweight and have a high risk for diabetes, consider being tested at a younger age or more often. Preventing infection Hepatitis B  If you have a higher risk for hepatitis B, you should be screened for this virus. You are considered at high risk for hepatitis B if: ? You were born in a country where hepatitis B is common. Ask your health care provider which countries are considered high risk. ? Your parents were born in a high-risk country, and you have not been immunized against hepatitis B (hepatitis B vaccine). ? You have HIV or AIDS. ? You use needles to inject street drugs. ? You live with someone who has hepatitis B. ? You have had sex with someone who has hepatitis B. ? You get hemodialysis treatment. ? You take certain medicines for conditions, including cancer, organ transplantation, and autoimmune conditions.  Hepatitis C  Blood testing is recommended for: ? Everyone born from [birth year] through 1963. ? Anyone with known risk factors for hepatitis C.  Sexually transmitted infections (STIs)  You should be screened for sexually transmitted infections (STIs) including gonorrhea and chlamydia if: ? You are sexually active and are younger than 75 years of age. ? You are older than 75 years of age and your health care provider tells you that you are at risk for this type of infection. ? Your sexual activity has changed since you were last screened and you are at an increased risk for chlamydia or gonorrhea. Ask your health care provider if you are at risk.  If you do not  have HIV, but are at risk, it may be recommended that you take a prescription medicine daily to prevent HIV infection. This is called pre-exposure prophylaxis (PrEP). You are considered at risk if: ? You are sexually active and do not regularly use condoms or know the HIV status of your partner(s). ? You take drugs by injection. ? You are sexually active with a partner who has HIV.  Talk with your health care provider about whether you are at high risk of being infected with HIV. If you choose to begin PrEP, you should first be tested for HIV. You should then be tested every 3 months for as long as you are taking PrEP. Pregnancy  If you are premenopausal and you may become pregnant, ask your health care provider about preconception counseling.  If you may become pregnant, take 400 to 800 micrograms (mcg) of folic acid every day.  If you want to prevent pregnancy, talk to your health care provider about birth control (contraception). Osteoporosis and menopause  Osteoporosis is a disease in which the bones lose minerals and strength with aging. This can result in serious bone fractures. Your risk for osteoporosis can be identified using a bone density scan.  If you are 57 years of age or older, or if you are at risk for osteoporosis and fractures, ask your health care provider if you should be screened.  Ask your health care provider whether you should take a calcium or vitamin D supplement to lower your risk for osteoporosis.  Menopause may have certain physical symptoms and risks.  Hormone replacement therapy may reduce some of these symptoms and risks. Talk to your health care provider about whether hormone replacement therapy is right for you. Follow these instructions at home:  Schedule regular health, dental, and eye exams.  Stay current with your immunizations.  Do not use any tobacco products including cigarettes, chewing tobacco, or electronic cigarettes.  If you are pregnant,  do not drink alcohol.  If you are breastfeeding, limit how much and how often you drink alcohol.  Limit alcohol intake to no more than 1 drink per day for nonpregnant women. One drink equals 12 ounces of beer, 5 ounces of wine, or 1 ounces of hard liquor.  Do not use street drugs.  Do not share needles.  Ask your health care provider for help if you need support or information about quitting drugs.  Tell your health care provider if you often feel depressed.  Tell your health care provider if you have ever been abused or do not feel safe at home. This information is not intended to replace advice given to you by your health care provider. Make sure you discuss any questions you have with your health care provider. Document Released: 04/01/2011 Document Revised: 02/22/2016 Document Reviewed: 06/20/2015 Elsevier Interactive Patient Education  [employer].

## 2018-05-06 ENCOUNTER — Other Ambulatory Visit: Payer: Self-pay | Admitting: Internal Medicine

## 2018-05-06 ENCOUNTER — Encounter: Payer: Self-pay | Admitting: Internal Medicine

## 2018-05-06 DIAGNOSIS — Z1231 Encounter for screening mammogram for malignant neoplasm of breast: Secondary | ICD-10-CM

## 2018-05-06 DIAGNOSIS — E2839 Other primary ovarian failure: Secondary | ICD-10-CM

## 2018-05-07 NOTE — Telephone Encounter (Signed)
Please advise Dr Panosh, thanks.   

## 2018-05-07 NOTE — Telephone Encounter (Signed)
Yes please put in order for  For dexa at [facility]   Dx estrogen deificient

## 2018-05-08 NOTE — Telephone Encounter (Signed)
I have placed the order for the dexa scan at [facility].  I have called and lmomtcb x 1 for the pt to make her aware.

## 2018-05-08 NOTE — Telephone Encounter (Signed)
Noted.  Will close the encounter

## 2018-05-08 NOTE — Telephone Encounter (Signed)
Pt aware of order placed.  

## 2018-05-25 ENCOUNTER — Other Ambulatory Visit: Payer: Self-pay | Admitting: Internal Medicine

## 2018-05-26 ENCOUNTER — Ambulatory Visit (INDEPENDENT_AMBULATORY_CARE_PROVIDER_SITE_OTHER)
Admission: RE | Admit: 2018-05-26 | Discharge: 2018-05-26 | Disposition: A | Discharge status: Home / Self Care | Payer: PPO | Source: Ambulatory Visit | Attending: Internal Medicine | Admitting: Internal Medicine

## 2018-05-26 DIAGNOSIS — E2839 Other primary ovarian failure: Secondary | ICD-10-CM

## 2018-05-29 NOTE — Progress Notes (Signed)
Chief Complaint  Patient presents with  . Annual Exam    No new concerns    HPI: Crystal Odom 77 y.o. comes in today for Preventive Medicare exam/ wellness visit .Since last visit. Doing well . Thyroid  ,   BP is ok :    Down to 5 mg  Lisinopril doing better   bp range 120/80or  So no syncope cough  No major change in health  No cv pulm sx  Some  Knee arthritis sx at times but   Stays very active   Health Maintenance  Topic Date Due  . INFLUENZA VACCINE  04/30/2018  . MAMMOGRAM  05/27/2019  . TETANUS/TDAP  02/03/2023  . COLONOSCOPY  06/02/2024  . DEXA SCAN  Completed  . PNA vac Low Risk Adult  Completed   Health Maintenance Review LIFESTYLE:  Exercise:   Walk every day and yog 4-5 days per week. Tobacco/ETS:n Alcohol: 3-4 per week Sugar beverages: no Sleep:   Good s;eeper.  7-8  Hours  Drug use: no HH:2   No pets    Hearing: hearing aids now    Vision:  No limitations at present . Last eye check UTD corrected to 20 -25   Safety:  Has smoke detector and wears seat belts.   No excess sun exposure. Sees dentist regularly.  Falls:  no  Depression: No anhedonia unusual crying or depressive symptoms  Nutrition: Eats well balanced diet; adequate calcium and vitamin D. No swallowing chewing problems.  Injury: no major injuries in the last six months.  Other healthcare providers:  Reviewed today .  Social:  Lives with spouse married. No pets.   Preventive parameters:  Reviewed   ADLS:   There are no problems or need for assistance  driving, feeding, obtaining food, dressing, toileting and bathing, managing money using phone. She is independent.   ROS:  No fevers bleeding  GEN/ HEENT: No fever, significant weight changes sweats headaches vision problems hearing changes, CV/ PULM; No chest pain shortness of breath cough, syncope,edema  change in exercise tolerance. GI /GU: No adominal pain, vomiting, change in bowel habits. No blood in the stool. No  significant GU symptoms. SKIN/HEME: ,no acute skin rashes suspicious lesions or bleeding. No lymphadenopathy, nodules, masses.  NEURO/ PSYCH:  No neurologic signs such as weakness numbness. No depression anxiety. IMM/ Allergy: No unusual infections.  Allergy .   REST of 12 system review negative except as per HPI   Past Medical History:  Diagnosis Date  . 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 Arthritis    left knee  . Diplopia 2002   eval neg  . Hyperlipidemia    HDL over 100  . Hypothyroidism   . Kidney stones    in past  . Leukopenia    with neg hemevaluation bm bx get wbc diff q 6 months  . UTI (lower urinary tract infection)     Family History  Problem Relation Age of Onset  . Other Father        major renal failure; SCCA in ear  . Osteoarthritis Father   . Heart disease Father   . Heart disease Mother        22  . Autoimmune disease Sister   . Autoimmune disease Brother   . Stroke Maternal Grandmother   . Thyroid disease Unknown   . Stroke Unknown   . Osteoporosis Unknown     Social History  Socioeconomic History  . Marital status: Married    Spouse name: Not on file  . Number of children: Not on file  . Years of education: Not on file  . Highest education level: Not on file  Occupational History  . Not on file  Social Needs  . Financial resource strain: Not on file  . Food insecurity:    Worry: Not on file    Inability: Not on file  . Transportation needs:    Medical: Not on file    Non-medical: Not on file  Tobacco Use  . Smoking status: Never Smoker  . Smokeless tobacco: Never Used  Substance and Sexual Activity  . Alcohol use: Yes    Alcohol/week: 2.0 - 3.0 standard drinks    Types: 2 - 3 Glasses of wine per week    Comment: 2 to 3 glasses a week   . Drug use: No  . Sexual activity: Not on file  Lifestyle  . Physical activity:    Days per week: Not on file    Minutes per session: Not on  file  . Stress: Not on file  Relationships  . Social connections:    Talks on phone: Not on file    Gets together: Not on file    Attends religious service: Not on file    Active member of club or organization: Not on file    Attends meetings of clubs or organizations: Not on file    Relationship status: Not on file  Other Topics Concern  . Not on file  Social History Narrative   Married   Retired  2008   Regular exercise-yes   [place] of [place]   No pets   Social etoh   G2P2      Husband hac CABG 2012   Travel grandchildren     Outpatient Encounter Medications as of 06/02/2018  Medication Sig  . aspirin 81 MG tablet Take 81 mg by mouth daily.    Marland Kitchen levothyroxine (SYNTHROID, LEVOTHROID) 88 MCG tablet TAKE 1 TABLET BY MOUTH EVERY DAY  . lisinopril (PRINIVIL,ZESTRIL) 5 MG tablet Take 1 tablet (5 mg total) by mouth daily.   No facility-administered encounter medications on file as of 06/02/2018.     EXAM:  BP 118/70 (BP Location: Right Arm, Patient Position: Sitting, Cuff Size: Normal)   Pulse 70   Temp 98 F (36.7 C) (Oral)   Ht 5' 4.5" (1.638 m)   Wt 125 lb 1.6 oz (56.7 kg)   BMI 21.14 kg/m   Body mass index is 21.14 kg/m.  Physical Exam: Vital signs reviewed [id number] is a well-developed well-nourished alert cooperative   who appears stated age in no acute distress.  HEENT: normocephalic atraumatic , Eyes: PERRL EOM's full, conjunctiva clear, Nares: paten,t no deformity discharge or tenderness., Ears: no deformity EAC's clear TMs with normal landmarks. Mouth: clear OP, no lesions, edema.  Moist mucous membranes. Dentition in adequate repair. NECK: supple without masses, thyromegaly or bruits. CHEST/PULM:  Clear to auscultation and percussion breath sounds equal no wheeze , rales or rhonchi. No chest wall deformities or tenderness. CV: PMI is nondisplaced, S1 S2 no gallops, murmurs, rubs. Peripheral pulses are full without delay.No JVD .  ABDOMEN: Bowel sounds normal nontender   No guard or rebound, no hepato splenomegal no CVA tenderness.   Extremtities:  No clubbing cyanosis or edema, no acute joint swelling or redness no focal atrophy mild djd changes hands  Nl gait  NEURO:  Oriented x3, cranial  nerves 3-12 appear to be intact, no obvious focal weakness,gait within normal limits no abnormal reflexes or asymmetrical SKIN: No acute rashes normal turgor, color, no bruising or petechiae. PSYCH: Oriented, good eye contact, no obvious depression anxiety, cognition and judgment appear normal. LN: no cervical axillary inguinal adenopathy No noted deficits in memory, attention, and speech.   Lab Results  Component Value Date   WBC 2.7 (L) 06/02/2018   HGB 13.3 06/02/2018   HCT 39.6 06/02/2018   PLT 290.0 06/02/2018   GLUCOSE 91 06/02/2018   CHOL 288 (H) 06/02/2018   TRIG 50.0 06/02/2018   HDL 111.00 06/02/2018   LDLDIRECT 149.6 02/02/2013   LDLCALC 167 (H) 06/02/2018   ALT 21 06/02/2018   AST 24 06/02/2018   NA 139 06/02/2018   K 4.5 06/02/2018   CL 102 06/02/2018   CREATININE 0.72 06/02/2018   BUN 21 06/02/2018   CO2 29 06/02/2018   TSH 1.81 06/02/2018   INR 0.91 11/10/2009   HGBA1C 5.6 06/02/2018    ASSESSMENT AND PLAN:  Discussed the following assessment and plan:  Visit for preventive health examination  Medication management - Plan: Basic metabolic panel, CBC with Differential/Platelet, Hemoglobin A1c, Hepatic function panel, Lipid panel, TSH  Hypothyroidism, unspecified type - Plan: Basic metabolic panel, CBC with Differential/Platelet, Hemoglobin A1c, Hepatic function panel, Lipid panel, TSH  Hyperlipidemia with target LDL less than 100 - Plan: Basic metabolic panel, CBC with Differential/Platelet, Hemoglobin A1c, Hepatic function panel, Lipid panel, TSH  Neutropenia, unspecified type ([place]) - Plan: Basic metabolic panel, CBC with Differential/Platelet, Hemoglobin A1c, Hepatic function panel, Lipid panel, TSH  Family history of autoimmune  disorder - Plan: Basic metabolic panel, CBC with Differential/Platelet, Hemoglobin A1c, Hepatic function panel, Lipid panel, TSH  Aortic valve calcification -  incidental findings  no murmur of significance seen today.  - Plan: Basic metabolic panel, CBC with Differential/Platelet, Hemoglobin A1c, Hepatic function panel, Lipid panel, TSH  Need for influenza vaccination - Plan: Flu vaccine HIGH DOSE PF (Fluzone High dose)  Hearing aid worn  Essential hypertension - at goal  few se  if  bp too low we can hold but seems to be  optinmal control today  Normal exam and monitor parameters   Has low risk for  Lipids situation after  Cards eval  Dexa,,, arthritis in s pine  Hip improved   t score  Patient Care Team: Panosh, Standley Brooking, MD as PCP - Loni Beckwith, MD (Orthopedic Surgery) Unice Bailey, MD (Internal Medicine) Pyrtle, Lajuan Lines, MD as Consulting Physician (Gastroenterology) Pyrtle, Lajuan Lines, MD as Consulting Physician (Gastroenterology) Marica Otter, OD (Optometry)  Patient Instructions   Continue lifestyle intervention healthy eating and exercise . Add resistance training 3 x per week .    Will notify you  of labs when available.   Yearly exam  For now  800 - 1000 IU  Vit d per day .   Health Maintenance, Female Adopting a healthy lifestyle and getting preventive care can go a long way to promote health and wellness. Talk with your health care provider about what schedule of regular examinations is right for you. This is a good chance for you to check in with your provider about disease prevention and staying healthy. In between checkups, there are plenty of things you can do on your own. Experts have done a lot of research about which lifestyle changes and preventive measures are most likely to keep you healthy. Ask your health care provider for more  information. Weight and diet Eat a healthy diet  Be sure to include plenty of vegetables, fruits, low-fat dairy products, and  lean protein.  Do not eat a lot of foods high in solid fats, added sugars, or salt.  Get regular exercise. This is one of the most important things you can do for your health. ? Most adults should exercise for at least 150 minutes each week. The exercise should increase your heart rate and make you sweat (moderate-intensity exercise). ? Most adults should also do strengthening exercises at least twice a week. This is in addition to the moderate-intensity exercise.  Maintain a healthy weight  Body mass index (BMI) is a measurement that can be used to identify possible weight problems. It estimates body fat based on height and weight. Your health care provider can help determine your BMI and help you achieve or maintain a healthy weight.  For females 26 years of age and older: ? A BMI below 18.5 is considered underweight. ? A BMI of 18.5 to 24.9 is normal. ? A BMI of 25 to 29.9 is considered overweight. ? A BMI of 30 and above is considered obese.  Watch levels of cholesterol and blood lipids  You should start having your blood tested for lipids and cholesterol at 75 years of age, then have this test every 5 years.  You may need to have your cholesterol levels checked more often if: ? Your lipid or cholesterol levels are high. ? You are older than 75 years of age. ? You are at high risk for heart disease.  Cancer screening Lung Cancer  Lung cancer screening is recommended for adults 32-70 years old who are at high risk for lung cancer because of a history of smoking.  A yearly low-dose CT scan of the lungs is recommended for people who: ? Currently smoke. ? Have quit within the past 15 years. ? Have at least a 30-pack-year history of smoking. A pack year is smoking an average of one pack of cigarettes a day for 1 year.  Yearly screening should continue until it has been 15 years since you quit.  Yearly screening should stop if you develop a health problem that would prevent you  from having lung cancer treatment.  Breast Cancer  Practice breast self-awareness. This means understanding how your breasts normally appear and feel.  It also means doing regular breast self-exams. Let your health care provider know about any changes, no matter how small.  If you are in your 20s or 30s, you should have a clinical breast exam (CBE) by a health care provider every 1-3 years as part of a regular health exam.  If you are 63 or older, have a CBE every year. Also consider having a breast X-ray (mammogram) every year.  If you have a family history of breast cancer, talk to your health care provider about genetic screening.  If you are at high risk for breast cancer, talk to your health care provider about having an MRI and a mammogram every year.  Breast cancer gene (BRCA) assessment is recommended for women who have family members with BRCA-related cancers. BRCA-related cancers include: ? Breast. ? Ovarian. ? Tubal. ? Peritoneal cancers.  Results of the assessment will determine the need for genetic counseling and BRCA1 and BRCA2 testing.  Cervical Cancer Your health care provider may recommend that you be screened regularly for cancer of the pelvic organs (ovaries, uterus, and vagina). This screening involves a pelvic examination, including checking for  microscopic changes to the surface of your cervix (Pap test). You may be encouraged to have this screening done every 3 years, beginning at age 52.  For women ages 76-65, health care providers may recommend pelvic exams and Pap testing every 3 years, or they may recommend the Pap and pelvic exam, combined with testing for human papilloma virus (HPV), every 5 years. Some types of HPV increase your risk of cervical cancer. Testing for HPV may also be done on women of any age with unclear Pap test results.  Other health care providers may not recommend any screening for nonpregnant women who are considered low risk for pelvic  cancer and who do not have symptoms. Ask your health care provider if a screening pelvic exam is right for you.  If you have had past treatment for cervical cancer or a condition that could lead to cancer, you need Pap tests and screening for cancer for at least 20 years after your treatment. If Pap tests have been discontinued, your risk factors (such as having a new sexual partner) need to be reassessed to determine if screening should resume. Some women have medical problems that increase the chance of getting cervical cancer. In these cases, your health care provider may recommend more frequent screening and Pap tests.  Colorectal Cancer  This type of cancer can be detected and often prevented.  Routine colorectal cancer screening usually begins at 75 years of age and continues through 75 years of age.  Your health care provider may recommend screening at an earlier age if you have risk factors for colon cancer.  Your health care provider may also recommend using home test kits to check for hidden blood in the stool.  A small camera at the end of a tube can be used to examine your colon directly (sigmoidoscopy or colonoscopy). This is done to check for the earliest forms of colorectal cancer.  Routine screening usually begins at age 32.  Direct examination of the colon should be repeated every 5-10 years through 75 years of age. However, you may need to be screened more often if early forms of precancerous polyps or small growths are found.  Skin Cancer  Check your skin from head to toe regularly.  Tell your health care provider about any new moles or changes in moles, especially if there is a change in a mole's shape or color.  Also tell your health care provider if you have a mole that is larger than the size of a pencil eraser.  Always use sunscreen. Apply sunscreen liberally and repeatedly throughout the day.  Protect yourself by wearing long sleeves, pants, a wide-brimmed hat,  and sunglasses whenever you are outside.  Heart disease, diabetes, and high blood pressure  High blood pressure causes heart disease and increases the risk of stroke. High blood pressure is more likely to develop in: ? People who have blood pressure in the high end of the normal range (130-139/85-89 mm Hg). ? People who are overweight or obese. ? People who are African American.  If you are 35-48 years of age, have your blood pressure checked every 3-5 years. If you are 20 years of age or older, have your blood pressure checked every year. You should have your blood pressure measured twice-once when you are at a hospital or clinic, and once when you are not at a hospital or clinic. Record the average of the two measurements. To check your blood pressure when you are not at a hospital  or clinic, you can use: ? An automated blood pressure machine at a pharmacy. ? A home blood pressure monitor.  If you are between 60 years and 40 years old, ask your health care provider if you should take aspirin to prevent strokes.  Have regular diabetes screenings. This involves taking a blood sample to check your fasting blood sugar level. ? If you are at a normal weight and have a low risk for diabetes, have this test once every three years after 75 years of age. ? If you are overweight and have a high risk for diabetes, consider being tested at a younger age or more often. Preventing infection Hepatitis B  If you have a higher risk for hepatitis B, you should be screened for this virus. You are considered at high risk for hepatitis B if: ? You were born in a country where hepatitis B is common. Ask your health care provider which countries are considered high risk. ? Your parents were born in a high-risk country, and you have not been immunized against hepatitis B (hepatitis B vaccine). ? You have HIV or AIDS. ? You use needles to inject street drugs. ? You live with someone who has hepatitis B. ? You  have had sex with someone who has hepatitis B. ? You get hemodialysis treatment. ? You take certain medicines for conditions, including cancer, organ transplantation, and autoimmune conditions.  Hepatitis C  Blood testing is recommended for: ? Everyone born from [birth year] through 1963. ? Anyone with known risk factors for hepatitis C.  Sexually transmitted infections (STIs)  You should be screened for sexually transmitted infections (STIs) including gonorrhea and chlamydia if: ? You are sexually active and are younger than 75 years of age. ? You are older than 75 years of age and your health care provider tells you that you are at risk for this type of infection. ? Your sexual activity has changed since you were last screened and you are at an increased risk for chlamydia or gonorrhea. Ask your health care provider if you are at risk.  If you do not have HIV, but are at risk, it may be recommended that you take a prescription medicine daily to prevent HIV infection. This is called pre-exposure prophylaxis (PrEP). You are considered at risk if: ? You are sexually active and do not regularly use condoms or know the HIV status of your partner(s). ? You take drugs by injection. ? You are sexually active with a partner who has HIV.  Talk with your health care provider about whether you are at high risk of being infected with HIV. If you choose to begin PrEP, you should first be tested for HIV. You should then be tested every 3 months for as long as you are taking PrEP. Pregnancy  If you are premenopausal and you may become pregnant, ask your health care provider about preconception counseling.  If you may become pregnant, take 400 to 800 micrograms (mcg) of folic acid every day.  If you want to prevent pregnancy, talk to your health care provider about birth control (contraception). Osteoporosis and menopause  Osteoporosis is a disease in which the bones lose minerals and strength with aging.  This can result in serious bone fractures. Your risk for osteoporosis can be identified using a bone density scan.  If you are 50 years of age or older, or if you are at risk for osteoporosis and fractures, ask your health care provider if you should be screened.  Ask your health care provider whether you should take a calcium or vitamin D supplement to lower your risk for osteoporosis.  Menopause may have certain physical symptoms and risks.  Hormone replacement therapy may reduce some of these symptoms and risks. Talk to your health care provider about whether hormone replacement therapy is right for you. Follow these instructions at home:  Schedule regular health, dental, and eye exams.  Stay current with your immunizations.  Do not use any tobacco products including cigarettes, chewing tobacco, or electronic cigarettes.  If you are pregnant, do not drink alcohol.  If you are breastfeeding, limit how much and how often you drink alcohol.  Limit alcohol intake to no more than 1 drink per day for nonpregnant women. One drink equals 12 ounces of beer, 5 ounces of wine, or 1 ounces of hard liquor.  Do not use street drugs.  Do not share needles.  Ask your health care provider for help if you need support or information about quitting drugs.  Tell your health care provider if you often feel depressed.  Tell your health care provider if you have ever been abused or do not feel safe at home. This information is not intended to replace advice given to you by your health care provider. Make sure you discuss any questions you have with your health care provider. Document Released: 04/01/2011 Document Revised: 02/22/2016 Document Reviewed: 06/20/2015 Elsevier Interactive Patient Education  2018 [place]. Panosh M.D.

## 2018-06-02 ENCOUNTER — Encounter: Payer: Self-pay | Admitting: Internal Medicine

## 2018-06-02 ENCOUNTER — Ambulatory Visit
Admission: RE | Admit: 2018-06-02 | Discharge: 2018-06-02 | Disposition: A | Discharge status: Home / Self Care | Payer: PPO | Source: Ambulatory Visit | Attending: Internal Medicine | Admitting: Internal Medicine

## 2018-06-02 ENCOUNTER — Ambulatory Visit (INDEPENDENT_AMBULATORY_CARE_PROVIDER_SITE_OTHER): Payer: PPO | Admitting: Internal Medicine

## 2018-06-02 VITALS — BP 118/70 | HR 70 | Temp 98.0°F | Ht 64.5 in | Wt 125.1 lb

## 2018-06-02 DIAGNOSIS — Z1231 Encounter for screening mammogram for malignant neoplasm of breast: Secondary | ICD-10-CM | POA: Diagnosis not present

## 2018-06-02 DIAGNOSIS — Z974 Presence of external hearing-aid: Secondary | ICD-10-CM | POA: Diagnosis not present

## 2018-06-02 DIAGNOSIS — E039 Hypothyroidism, unspecified: Secondary | ICD-10-CM | POA: Diagnosis not present

## 2018-06-02 DIAGNOSIS — I359 Nonrheumatic aortic valve disorder, unspecified: Secondary | ICD-10-CM

## 2018-06-02 DIAGNOSIS — D709 Neutropenia, unspecified: Secondary | ICD-10-CM | POA: Diagnosis not present

## 2018-06-02 DIAGNOSIS — E785 Hyperlipidemia, unspecified: Secondary | ICD-10-CM | POA: Diagnosis not present

## 2018-06-02 DIAGNOSIS — Z832 Family history of diseases of the blood and blood-forming organs and certain disorders involving the immune mechanism: Secondary | ICD-10-CM

## 2018-06-02 DIAGNOSIS — Z0000 Encounter for general adult medical examination without abnormal findings: Secondary | ICD-10-CM | POA: Diagnosis not present

## 2018-06-02 DIAGNOSIS — Z23 Encounter for immunization: Secondary | ICD-10-CM

## 2018-06-02 DIAGNOSIS — I10 Essential (primary) hypertension: Secondary | ICD-10-CM

## 2018-06-02 DIAGNOSIS — Z79899 Other long term (current) drug therapy: Secondary | ICD-10-CM | POA: Diagnosis not present

## 2018-06-02 LAB — HEPATIC FUNCTION PANEL
ALK PHOS: 68 U/L (ref 39–117)
ALT: 21 U/L (ref 0–35)
AST: 24 U/L (ref 0–37)
Albumin: 4.2 g/dL (ref 3.5–5.2)
BILIRUBIN DIRECT: 0.1 mg/dL (ref 0.0–0.3)
TOTAL PROTEIN: 7.1 g/dL (ref 6.0–8.3)
Total Bilirubin: 0.5 mg/dL (ref 0.2–1.2)

## 2018-06-02 LAB — LIPID PANEL
CHOL/HDL RATIO: 3
Cholesterol: 288 mg/dL — ABNORMAL HIGH (ref 0–200)
HDL: 111 mg/dL (ref >39.00)
LDL Cholesterol: 167 mg/dL — ABNORMAL HIGH (ref 0–99)
NONHDL: 176.94
Triglycerides: 50 mg/dL (ref 0.0–149.0)
VLDL: 10 mg/dL (ref 0.0–40.0)

## 2018-06-02 LAB — CBC WITH DIFFERENTIAL/PLATELET
BASOS ABS: 0 10*3/uL (ref 0.0–0.1)
Basophils Relative: 1.5 % (ref 0.0–3.0)
EOS PCT: 3.2 % (ref 0.0–5.0)
Eosinophils Absolute: 0.1 10*3/uL (ref 0.0–0.7)
HCT: 39.6 % (ref 36.0–46.0)
Hemoglobin: 13.3 g/dL (ref 12.0–15.0)
Lymphocytes Relative: 33.9 % (ref 12.0–46.0)
Lymphs Abs: 0.9 10*3/uL (ref 0.7–4.0)
MCHC: 33.7 g/dL (ref 30.0–36.0)
MCV: 92.2 fl (ref 78.0–100.0)
MONOS PCT: 17.2 % — AB (ref 3.0–12.0)
Monocytes Absolute: 0.5 10*3/uL (ref 0.1–1.0)
NEUTROS ABS: 1.2 10*3/uL — AB (ref 1.4–7.7)
Neutrophils Relative %: 44.2 % (ref 43.0–77.0)
Platelets: 290 10*3/uL (ref 150.0–400.0)
RBC: 4.29 Mil/uL (ref 3.87–5.11)
RDW: 13.6 % (ref 11.5–15.5)
WBC: 2.7 10*3/uL — ABNORMAL LOW (ref 4.0–10.5)

## 2018-06-02 LAB — BASIC METABOLIC PANEL
BUN: 21 mg/dL (ref 6–23)
CO2: 29 meq/L (ref 19–32)
Calcium: 9.7 mg/dL (ref 8.4–10.5)
Chloride: 102 mEq/L (ref 96–112)
Creatinine, Ser: 0.72 mg/dL (ref 0.40–1.20)
GFR: 83.93 mL/min (ref >60.00)
GLUCOSE: 91 mg/dL (ref 70–99)
POTASSIUM: 4.5 meq/L (ref 3.5–5.1)
SODIUM: 139 meq/L (ref 135–145)

## 2018-06-02 LAB — HEMOGLOBIN A1C: Hgb A1c MFr Bld: 5.6 % (ref 4.6–6.5)

## 2018-06-02 LAB — TSH: TSH: 1.81 u[IU]/mL (ref 0.35–4.50)

## 2018-06-02 NOTE — Patient Instructions (Addendum)
Continue lifestyle intervention healthy eating and exercise . Add resistance training 3 x per week .    Will notify you  of labs when available.   Yearly exam  For now  800 - 1000 IU  Vit d per day .   Health Maintenance, Female Adopting a healthy lifestyle and getting preventive care can go a long way to promote health and wellness. Talk with your health care provider about what schedule of regular examinations is right for you. This is a good chance for you to check in with your provider about disease prevention and staying healthy. In between checkups, there are plenty of things you can do on your own. Experts have done a lot of research about which lifestyle changes and preventive measures are most likely to keep you healthy. Ask your health care provider for more information. Weight and diet Eat a healthy diet  Be sure to include plenty of vegetables, fruits, low-fat dairy products, and lean protein.  Do not eat a lot of foods high in solid fats, added sugars, or salt.  Get regular exercise. This is one of the most important things you can do for your health. ? Most adults should exercise for at least 150 minutes each week. The exercise should increase your heart rate and make you sweat (moderate-intensity exercise). ? Most adults should also do strengthening exercises at least twice a week. This is in addition to the moderate-intensity exercise.  Maintain a healthy weight  Body mass index (BMI) is a measurement that can be used to identify possible weight problems. It estimates body fat based on height and weight. Your health care provider can help determine your BMI and help you achieve or maintain a healthy weight.  For females 10 years of age and older: ? A BMI below 18.5 is considered underweight. ? A BMI of 18.5 to 24.9 is normal. ? A BMI of 25 to 29.9 is considered overweight. ? A BMI of 30 and above is considered obese.  Watch levels of cholesterol and blood lipids  You  should start having your blood tested for lipids and cholesterol at 75 years of age, then have this test every 5 years.  You may need to have your cholesterol levels checked more often if: ? Your lipid or cholesterol levels are high. ? You are older than 75 years of age. ? You are at high risk for heart disease.  Cancer screening Lung Cancer  Lung cancer screening is recommended for adults 67-31 years old who are at high risk for lung cancer because of a history of smoking.  A yearly low-dose CT scan of the lungs is recommended for people who: ? Currently smoke. ? Have quit within the past 15 years. ? Have at least a 30-pack-year history of smoking. A pack year is smoking an average of one pack of cigarettes a day for 1 year.  Yearly screening should continue until it has been 15 years since you quit.  Yearly screening should stop if you develop a health problem that would prevent you from having lung cancer treatment.  Breast Cancer  Practice breast self-awareness. This means understanding how your breasts normally appear and feel.  It also means doing regular breast self-exams. Let your health care provider know about any changes, no matter how small.  If you are in your 20s or 30s, you should have a clinical breast exam (CBE) by a health care provider every 1-3 years as part of a regular health exam.  If you are 40 or older, have a CBE every year. Also consider having a breast X-ray (mammogram) every year.  If you have a family history of breast cancer, talk to your health care provider about genetic screening.  If you are at high risk for breast cancer, talk to your health care provider about having an MRI and a mammogram every year.  Breast cancer gene (BRCA) assessment is recommended for women who have family members with BRCA-related cancers. BRCA-related cancers include: ? Breast. ? Ovarian. ? Tubal. ? Peritoneal cancers.  Results of the assessment will determine the  need for genetic counseling and BRCA1 and BRCA2 testing.  Cervical Cancer Your health care provider may recommend that you be screened regularly for cancer of the pelvic organs (ovaries, uterus, and vagina). This screening involves a pelvic examination, including checking for microscopic changes to the surface of your cervix (Pap test). You may be encouraged to have this screening done every 3 years, beginning at age 59.  For women ages 56-65, health care providers may recommend pelvic exams and Pap testing every 3 years, or they may recommend the Pap and pelvic exam, combined with testing for human papilloma virus (HPV), every 5 years. Some types of HPV increase your risk of cervical cancer. Testing for HPV may also be done on women of any age with unclear Pap test results.  Other health care providers may not recommend any screening for nonpregnant women who are considered low risk for pelvic cancer and who do not have symptoms. Ask your health care provider if a screening pelvic exam is right for you.  If you have had past treatment for cervical cancer or a condition that could lead to cancer, you need Pap tests and screening for cancer for at least 20 years after your treatment. If Pap tests have been discontinued, your risk factors (such as having a new sexual partner) need to be reassessed to determine if screening should resume. Some women have medical problems that increase the chance of getting cervical cancer. In these cases, your health care provider may recommend more frequent screening and Pap tests.  Colorectal Cancer  This type of cancer can be detected and often prevented.  Routine colorectal cancer screening usually begins at 75 years of age and continues through 75 years of age.  Your health care provider may recommend screening at an earlier age if you have risk factors for colon cancer.  Your health care provider may also recommend using home test kits to check for hidden blood  in the stool.  A small camera at the end of a tube can be used to examine your colon directly (sigmoidoscopy or colonoscopy). This is done to check for the earliest forms of colorectal cancer.  Routine screening usually begins at age 43.  Direct examination of the colon should be repeated every 5-10 years through 75 years of age. However, you may need to be screened more often if early forms of precancerous polyps or small growths are found.  Skin Cancer  Check your skin from head to toe regularly.  Tell your health care provider about any new moles or changes in moles, especially if there is a change in a mole's shape or color.  Also tell your health care provider if you have a mole that is larger than the size of a pencil eraser.  Always use sunscreen. Apply sunscreen liberally and repeatedly throughout the day.  Protect yourself by wearing long sleeves, pants, a wide-brimmed hat, and  sunglasses whenever you are outside.  Heart disease, diabetes, and high blood pressure  High blood pressure causes heart disease and increases the risk of stroke. High blood pressure is more likely to develop in: ? People who have blood pressure in the high end of the normal range (130-139/85-89 mm Hg). ? People who are overweight or obese. ? People who are African American.  If you are 23-81 years of age, have your blood pressure checked every 3-5 years. If you are 36 years of age or older, have your blood pressure checked every year. You should have your blood pressure measured twice-once when you are at a hospital or clinic, and once when you are not at a hospital or clinic. Record the average of the two measurements. To check your blood pressure when you are not at a hospital or clinic, you can use: ? An automated blood pressure machine at a pharmacy. ? A home blood pressure monitor.  If you are between 20 years and 24 years old, ask your health care provider if you should take aspirin to prevent  strokes.  Have regular diabetes screenings. This involves taking a blood sample to check your fasting blood sugar level. ? If you are at a normal weight and have a low risk for diabetes, have this test once every three years after 75 years of age. ? If you are overweight and have a high risk for diabetes, consider being tested at a younger age or more often. Preventing infection Hepatitis B  If you have a higher risk for hepatitis B, you should be screened for this virus. You are considered at high risk for hepatitis B if: ? You were born in a country where hepatitis B is common. Ask your health care provider which countries are considered high risk. ? Your parents were born in a high-risk country, and you have not been immunized against hepatitis B (hepatitis B vaccine). ? You have HIV or AIDS. ? You use needles to inject street drugs. ? You live with someone who has hepatitis B. ? You have had sex with someone who has hepatitis B. ? You get hemodialysis treatment. ? You take certain medicines for conditions, including cancer, organ transplantation, and autoimmune conditions.  Hepatitis C  Blood testing is recommended for: ? Everyone born from [birth year] through 1963. ? Anyone with known risk factors for hepatitis C.  Sexually transmitted infections (STIs)  You should be screened for sexually transmitted infections (STIs) including gonorrhea and chlamydia if: ? You are sexually active and are younger than 75 years of age. ? You are older than 75 years of age and your health care provider tells you that you are at risk for this type of infection. ? Your sexual activity has changed since you were last screened and you are at an increased risk for chlamydia or gonorrhea. Ask your health care provider if you are at risk.  If you do not have HIV, but are at risk, it may be recommended that you take a prescription medicine daily to prevent HIV infection. This is called pre-exposure prophylaxis  (PrEP). You are considered at risk if: ? You are sexually active and do not regularly use condoms or know the HIV status of your partner(s). ? You take drugs by injection. ? You are sexually active with a partner who has HIV.  Talk with your health care provider about whether you are at high risk of being infected with HIV. If you choose to begin PrEP, you  should first be tested for HIV. You should then be tested every 3 months for as long as you are taking PrEP. Pregnancy  If you are premenopausal and you may become pregnant, ask your health care provider about preconception counseling.  If you may become pregnant, take 400 to 800 micrograms (mcg) of folic acid every day.  If you want to prevent pregnancy, talk to your health care provider about birth control (contraception). Osteoporosis and menopause  Osteoporosis is a disease in which the bones lose minerals and strength with aging. This can result in serious bone fractures. Your risk for osteoporosis can be identified using a bone density scan.  If you are 27 years of age or older, or if you are at risk for osteoporosis and fractures, ask your health care provider if you should be screened.  Ask your health care provider whether you should take a calcium or vitamin D supplement to lower your risk for osteoporosis.  Menopause may have certain physical symptoms and risks.  Hormone replacement therapy may reduce some of these symptoms and risks. Talk to your health care provider about whether hormone replacement therapy is right for you. Follow these instructions at home:  Schedule regular health, dental, and eye exams.  Stay current with your immunizations.  Do not use any tobacco products including cigarettes, chewing tobacco, or electronic cigarettes.  If you are pregnant, do not drink alcohol.  If you are breastfeeding, limit how much and how often you drink alcohol.  Limit alcohol intake to no more than 1 drink per day for  nonpregnant women. One drink equals 12 ounces of beer, 5 ounces of wine, or 1 ounces of hard liquor.  Do not use street drugs.  Do not share needles.  Ask your health care provider for help if you need support or information about quitting drugs.  Tell your health care provider if you often feel depressed.  Tell your health care provider if you have ever been abused or do not feel safe at home. This information is not intended to replace advice given to you by your health care provider. Make sure you discuss any questions you have with your health care provider. Document Released: 04/01/2011 Document Revised: 02/22/2016 Document Reviewed: 06/20/2015 Elsevier Interactive Patient Education  [employer].

## 2018-06-03 ENCOUNTER — Other Ambulatory Visit: Payer: Self-pay | Admitting: Internal Medicine

## 2018-06-03 DIAGNOSIS — R928 Other abnormal and inconclusive findings on diagnostic imaging of breast: Secondary | ICD-10-CM

## 2018-06-05 ENCOUNTER — Encounter: Payer: Self-pay | Admitting: *Deleted

## 2018-06-10 ENCOUNTER — Ambulatory Visit
Admission: RE | Admit: 2018-06-10 | Discharge: 2018-06-10 | Disposition: A | Discharge status: Home / Self Care | Payer: PPO | Source: Ambulatory Visit | Attending: Internal Medicine | Admitting: Internal Medicine

## 2018-06-10 DIAGNOSIS — R928 Other abnormal and inconclusive findings on diagnostic imaging of breast: Secondary | ICD-10-CM

## 2018-06-10 DIAGNOSIS — N6489 Other specified disorders of breast: Secondary | ICD-10-CM | POA: Diagnosis not present

## 2018-08-17 ENCOUNTER — Other Ambulatory Visit: Payer: Self-pay | Admitting: Internal Medicine

## 2018-09-18 ENCOUNTER — Other Ambulatory Visit: Payer: Self-pay | Admitting: Family Medicine

## 2018-09-18 NOTE — Telephone Encounter (Signed)
Dr Panosh pt 

## 2018-10-20 DIAGNOSIS — S83281A Other tear of lateral meniscus, current injury, right knee, initial encounter: Secondary | ICD-10-CM | POA: Diagnosis not present

## 2018-10-29 DIAGNOSIS — M25561 Pain in right knee: Secondary | ICD-10-CM | POA: Diagnosis not present

## 2018-11-11 ENCOUNTER — Other Ambulatory Visit: Payer: Self-pay | Admitting: Internal Medicine

## 2018-12-14 ENCOUNTER — Other Ambulatory Visit: Payer: Self-pay | Admitting: Internal Medicine

## 2019-02-06 ENCOUNTER — Other Ambulatory Visit: Payer: Self-pay | Admitting: Internal Medicine

## 2019-04-26 ENCOUNTER — Ambulatory Visit: Payer: PPO

## 2019-05-05 ENCOUNTER — Other Ambulatory Visit: Payer: Self-pay | Admitting: Internal Medicine

## 2019-06-02 DIAGNOSIS — H524 Presbyopia: Secondary | ICD-10-CM | POA: Diagnosis not present

## 2019-06-02 DIAGNOSIS — H25813 Combined forms of age-related cataract, bilateral: Secondary | ICD-10-CM | POA: Diagnosis not present

## 2019-06-02 DIAGNOSIS — H5211 Myopia, right eye: Secondary | ICD-10-CM | POA: Diagnosis not present

## 2019-06-02 DIAGNOSIS — H52223 Regular astigmatism, bilateral: Secondary | ICD-10-CM | POA: Diagnosis not present

## 2019-06-02 DIAGNOSIS — H3550 Unspecified hereditary retinal dystrophy: Secondary | ICD-10-CM | POA: Diagnosis not present

## 2019-06-02 DIAGNOSIS — H3540 Unspecified peripheral retinal degeneration: Secondary | ICD-10-CM | POA: Diagnosis not present

## 2019-06-02 NOTE — Progress Notes (Signed)
Chief Complaint  Patient presents with   Annual Exam    Pt has no concerns     HPI: Crystal Odom 78 y.o. comes in today for Preventive Medicare exam/ wellness visit .and Chronic disease management   No sig change in health doing well  BP doing well : no syncope  Thyroid no change Taking vit d qd   Health Maintenance  Topic Date Due   INFLUENZA VACCINE  05/01/2019   TETANUS/TDAP  02/03/2023   COLONOSCOPY  06/02/2024   DEXA SCAN  Completed   PNA vac Low Risk Adult  Completed   Health Maintenance Review LIFESTYLE:  Exercise:  Walks daily  Yoga at home  Gym weights   3x per week Tobacco/ETS: no Alcohol:   Few a week Sugar beverages:no Sleep:7-8 Drug use: no HH: 2 No working hours     Hearing: aids  High pitched loss not using today  Vision:  No limitations at present . Last eye check UTD opometrest  Cataracts   Safety:  Has smoke detector and wears seat belts.  No firearms. No excess sun exposure. Sees dentist regularly.  Falls:  no  Memory: Felt to be good  , no concern from her or her family.  Depression: No anhedonia unusual crying or depressive symptoms  Nutrition: Eats well balanced diet; adequate calcium and vitamin D. No swallowing chewing problems.  Injury: no major injuries in the last six months.  Other healthcare providers:  Reviewed today .  Preventive parameters:   Reviewed   ADLS:   There are no problems or need for assistance  driving, feeding, obtaining food, dressing, toileting and bathing, managing money using phone. She is independent    ROS:  GEN/ HEENT: No fever, significant weight changes sweats headaches vision problems hearing changes, CV/ PULM; No chest pain shortness of breath cough, syncope,edema  change in exercise tolerance. GI /GU: No adominal pain, vomiting, change in bowel habits. No blood in the stool. No significant GU symptoms. SKIN/HEME: ,no acute skin rashes suspicious lesions or bleeding. No  lymphadenopathy, nodules, masses.  NEURO/ PSYCH:  No neurologic signs such as weakness numbness. No depression anxiety. IMM/ Allergy: No unusual infections.  Allergy .   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 neutropenia   Arthritis    left knee   Diplopia 2002   eval neg   Hyperlipidemia    HDL over 100   Hypothyroidism    Kidney stones    in past   Leukopenia    with neg hemevaluation bm bx get wbc diff q 6 months   UTI (lower urinary tract infection)     Family History  Problem Relation Age of Onset   Other Father        major renal failure; SCCA in ear   Osteoarthritis Father    Heart disease Father    Heart disease Mother        12   Autoimmune disease Sister    Autoimmune disease Brother    Stroke Maternal Grandmother    Thyroid disease Unknown    Stroke Unknown    Osteoporosis Unknown     Social History   Socioeconomic History   Marital status: Married    Spouse name: Not on file   Number of children: Not on file   Years of education: Not on  file   Highest education level: Not on file  Occupational History   Not on file  Social Needs   Financial resource strain: Not on file   Food insecurity    Worry: Not on file    Inability: Not on file   Transportation needs    Medical: Not on file    Non-medical: Not on file  Tobacco Use   Smoking status: Never Smoker   Smokeless tobacco: Never Used  Substance and Sexual Activity   Alcohol use: Yes    Alcohol/week: 2.0 - 3.0 standard drinks    Types: 2 - 3 Glasses of wine per week    Comment: 2 to 3 glasses a week    Drug use: No   Sexual activity: Not on file  Lifestyle   Physical activity    Days per week: Not on file    Minutes per session: Not on file   Stress: Not on file  Relationships   Social connections    Talks on phone: Not on  file    Gets together: Not on file    Attends religious service: Not on file    Active member of club or organization: Not on file    Attends meetings of clubs or organizations: Not on file    Relationship status: Not on file  Other Topics Concern   Not on file  Social History Narrative   Married   Retired  2008   Regular exercise-yes   HH of 2   No pets   Social etoh   G2P2      Husband hac CABG 2012   Travel grandchildren     Outpatient Encounter Medications as of 06/04/2019  Medication Sig   aspirin 81 MG tablet Take 81 mg by mouth daily.     levothyroxine (SYNTHROID) 88 MCG tablet TAKE 1 TABLET BY MOUTH EVERY DAY   lisinopril (PRINIVIL,ZESTRIL) 10 MG tablet TAKE 1 TABLET BY MOUTH EVERY DAY   [DISCONTINUED] lisinopril (PRINIVIL,ZESTRIL) 5 MG tablet TAKE 1 TABLET BY MOUTH EVERY DAY   No facility-administered encounter medications on file as of 06/04/2019.     EXAM:  BP 120/62 (BP Location: Right Arm, Patient Position: Sitting, Cuff Size: Normal)    Pulse 75    Temp 97.6 F (36.4 C) (Temporal)    Ht 5' 4.5" (1.638 m)    Wt 125 lb 12.8 oz (57.1 kg)    SpO2 97%    BMI 21.26 kg/m   Body mass index is 21.26 kg/m.  Physical Exam: Vital signs reviewed [id number] is a well-developed well-nourished alert cooperative   who appears stated age in no acute distress.  HEENT: normocephalic atraumatic , Eyes: PERRL EOM's full, conjunctiva clear, , Ears: no deformity EAC's clear TMs with normal landmarks. Mouth: clear OP,deferred NECK: supple without masses, thyromegaly or bruits. CHEST/PULM:  Clear to auscultation and percussion breath sounds equal no wheeze , rales or rhonchi. No chest wall deformities or tenderness. CV: PMI is nondisplaced, S1 S2 no gallops, rubs. Peripheral pulses are full without delay.No JVD .  2/a sem murmur heard best supineLSB   No radiation  ABDOMEN: Bowel sounds normal nontender  No guard or rebound, no hepato splenomegal no CVA tenderness.   Extremtities:   No clubbing cyanosis or edema, no acute joint swelling or redness no focal atrophy mild oa  NEURO:  Oriented x3, cranial nerves 3-12 appear to be intact, no obvious focal weakness,gait within normal limits no abnormal reflexes or asymmetrical SKIN: No  acute rashes normal turgor, color, no bruising or petechiae. PSYCH: Oriented, good eye contact, no obvious depression anxiety, cognition and judgment appear normal. LN: no cervical axillary inguinal adenopathy No noted deficits in memory, attention, and speech.   Lab Results  Component Value Date   WBC 2.7 (L) 06/02/2018   HGB 13.3 06/02/2018   HCT 39.6 06/02/2018   PLT 290.0 06/02/2018   GLUCOSE 91 06/02/2018   CHOL 288 (H) 06/02/2018   TRIG 50.0 06/02/2018   HDL 111.00 06/02/2018   LDLDIRECT 149.6 02/02/2013   LDLCALC 167 (H) 06/02/2018   ALT 21 06/02/2018   AST 24 06/02/2018   NA 139 06/02/2018   K 4.5 06/02/2018   CL 102 06/02/2018   CREATININE 0.72 06/02/2018   BUN 21 06/02/2018   CO2 29 06/02/2018   TSH 1.81 06/02/2018   INR 0.91 11/10/2009   HGBA1C 5.6 06/02/2018    ASSESSMENT AND PLAN:  Discussed the following assessment and plan:  Visit for preventive health examination  Hypothyroidism, unspecified type - Plan: Basic metabolic panel, CBC with Differential/Platelet, Hepatic function panel, TSH, Lipid panel  Medication management - Plan: Basic metabolic panel, CBC with Differential/Platelet, Hepatic function panel, TSH, Lipid panel  Hyperlipidemia with target LDL less than 100 - 0 cacscore - Plan: Basic metabolic panel, CBC with Differential/Platelet, Hepatic function panel, TSH, Lipid panel  Neutropenia, unspecified type ([place]) - Plan: Basic metabolic panel, CBC with Differential/Platelet, Hepatic function panel, TSH, Lipid panel  Aortic valve calcification - Plan: Basic metabolic panel, CBC with Differential/Platelet, Hepatic function panel, TSH, Lipid panel, ECHOCARDIOGRAM COMPLETE  Hearing aid worn  Family  history of autoimmune disorder - Plan: Basic metabolic panel, CBC with Differential/Platelet, Hepatic function panel, TSH, Lipid panel  Need for influenza vaccination - Plan: Flu Vaccine QUAD High Dose(Fluad)  Essential hypertension - Plan: Basic metabolic panel, CBC with Differential/Platelet, Hepatic function panel, TSH, Lipid panel, ECHOCARDIOGRAM COMPLETE  Cardiac murmur - Plan: ECHOCARDIOGRAM COMPLETE reveiwed record  Last echo over 2 years ago and A sclerosis  mumur may be more prominent ?   Nl otherwise  Murmur   Order echo to ensure no  Sig hemodynamic changes     HLD Not on statin  Cause of favorable cac and profile   Despite elevated lipids and calcium seen on x ray  Disc knee pain and  Conditioning reahab she is active  And well  Continue exercise vit d  For bone health  Patient Care Team: Hyun Reali, Standley Brooking, MD as PCP - Loni Beckwith, MD (Orthopedic Surgery) Unice Bailey, MD (Internal Medicine) Pyrtle, Lajuan Lines, MD as Consulting Physician (Gastroenterology) Pyrtle, Lajuan Lines, MD as Consulting Physician (Gastroenterology) Marica Otter, Alma (Optometry)  Patient Instructions  Glad you are  Doing well . Will get a follow up echo cardiogram   As discussed  If all ok then yearly cpx and labs      Health Maintenance, Female Adopting a healthy lifestyle and getting preventive care are important in promoting health and wellness. Ask your health care provider about:  The right schedule for you to have regular tests and exams.  Things you can do on your own to prevent diseases and keep yourself healthy. What should I know about diet, weight, and exercise? Eat a healthy diet   Eat a diet that includes plenty of vegetables, fruits, low-fat dairy products, and lean protein.  Do not eat a lot of foods that are high in solid fats, added sugars, or sodium. Maintain a healthy weight  Body mass index (BMI) is used to identify weight problems. It estimates body fat based on height  and weight. Your health care provider can help determine your BMI and help you achieve or maintain a healthy weight. Get regular exercise Get regular exercise. This is one of the most important things you can do for your health. Most adults should:  Exercise for at least 150 minutes each week. The exercise should increase your heart rate and make you sweat (moderate-intensity exercise).  Do strengthening exercises at least twice a week. This is in addition to the moderate-intensity exercise.  Spend less time sitting. Even light physical activity can be beneficial. Watch cholesterol and blood lipids Have your blood tested for lipids and cholesterol at 78 years of age, then have this test every 5 years. Have your cholesterol levels checked more often if:  Your lipid or cholesterol levels are high.  You are older than 76 years of age.  You are at high risk for heart disease. What should I know about cancer screening? Depending on your health history and family history, you may need to have cancer screening at various ages. This may include screening for:  Breast cancer.  Cervical cancer.  Colorectal cancer.  Skin cancer.  Lung cancer. What should I know about heart disease, diabetes, and high blood pressure? Blood pressure and heart disease  High blood pressure causes heart disease and increases the risk of stroke. This is more likely to develop in people who have high blood pressure readings, are of African descent, or are overweight.  Have your blood pressure checked: ? Every 3-5 years if you are 26-73 years of age. ? Every year if you are 66 years old or older. Diabetes Have regular diabetes screenings. This checks your fasting blood sugar level. Have the screening done:  Once every three years after age 50 if you are at a normal weight and have a low risk for diabetes.  More often and at a younger age if you are overweight or have a high risk for diabetes. What should I  know about preventing infection? Hepatitis B If you have a higher risk for hepatitis B, you should be screened for this virus. Talk with your health care provider to find out if you are at risk for hepatitis B infection. Hepatitis C Testing is recommended for:  Everyone born from [birth year] through 1965.  Anyone with known risk factors for hepatitis C. Sexually transmitted infections (STIs)  Get screened for STIs, including gonorrhea and chlamydia, if: ? You are sexually active and are younger than 76 years of age. ? You are older than 76 years of age and your health care provider tells you that you are at risk for this type of infection. ? Your sexual activity has changed since you were last screened, and you are at increased risk for chlamydia or gonorrhea. Ask your health care provider if you are at risk.  Ask your health care provider about whether you are at high risk for HIV. Your health care provider may recommend a prescription medicine to help prevent HIV infection. If you choose to take medicine to prevent HIV, you should first get tested for HIV. You should then be tested every 3 months for as long as you are taking the medicine. Pregnancy  If you are about to stop having your period (premenopausal) and you may become pregnant, seek counseling before you get pregnant.  Take 400 to 800 micrograms (mcg) of folic acid every day if  you become pregnant.  Ask for birth control (contraception) if you want to prevent pregnancy. Osteoporosis and menopause Osteoporosis is a disease in which the bones lose minerals and strength with aging. This can result in bone fractures. If you are 78 years old or older, or if you are at risk for osteoporosis and fractures, ask your health care provider if you should:  Be screened for bone loss.  Take a calcium or vitamin D supplement to lower your risk of fractures.  Be given hormone replacement therapy (HRT) to treat symptoms of menopause. Follow these  instructions at home: Lifestyle  Do not use any products that contain nicotine or tobacco, such as cigarettes, e-cigarettes, and chewing tobacco. If you need help quitting, ask your health care provider.  Do not use street drugs.  Do not share needles.  Ask your health care provider for help if you need support or information about quitting drugs. Alcohol use  Do not drink alcohol if: ? Your health care provider tells you not to drink. ? You are pregnant, may be pregnant, or are planning to become pregnant.  If you drink alcohol: ? Limit how much you use to 0-1 drink a day. ? Limit intake if you are breastfeeding.  Be aware of how much alcohol is in your drink. In the [place] one drink equals one 12 oz bottle of beer (355 mL), one 5 oz glass of wine (148 mL), or one 1 oz glass of hard liquor (44 mL). General instructions  Schedule regular health, dental, and eye exams.  Stay current with your vaccines.  Tell your health care provider if: ? You often feel depressed. ? You have ever been abused or do not feel safe at home. Summary  Adopting a healthy lifestyle and getting preventive care are important in promoting health and wellness.  Follow your health care provider's instructions about healthy diet, exercising, and getting tested or screened for diseases.  Follow your health care provider's instructions on monitoring your cholesterol and blood pressure. This information is not intended to replace advice given to you by your health care provider. Make sure you discuss any questions you have with your health care provider. Document Released: 04/01/2011 Document Revised: 09/09/2018 Document Reviewed: 09/09/2018 Elsevier Patient Education  2020 [place] Teshawn Moan M.D.

## 2019-06-04 ENCOUNTER — Ambulatory Visit (INDEPENDENT_AMBULATORY_CARE_PROVIDER_SITE_OTHER): Payer: PPO | Admitting: Internal Medicine

## 2019-06-04 ENCOUNTER — Encounter: Payer: Self-pay | Admitting: Internal Medicine

## 2019-06-04 VITALS — BP 120/62 | HR 75 | Temp 97.6°F | Ht 64.5 in | Wt 125.8 lb

## 2019-06-04 DIAGNOSIS — E039 Hypothyroidism, unspecified: Secondary | ICD-10-CM | POA: Diagnosis not present

## 2019-06-04 DIAGNOSIS — I10 Essential (primary) hypertension: Secondary | ICD-10-CM | POA: Diagnosis not present

## 2019-06-04 DIAGNOSIS — Z79899 Other long term (current) drug therapy: Secondary | ICD-10-CM | POA: Diagnosis not present

## 2019-06-04 DIAGNOSIS — E785 Hyperlipidemia, unspecified: Secondary | ICD-10-CM | POA: Diagnosis not present

## 2019-06-04 DIAGNOSIS — Z974 Presence of external hearing-aid: Secondary | ICD-10-CM | POA: Diagnosis not present

## 2019-06-04 DIAGNOSIS — Z23 Encounter for immunization: Secondary | ICD-10-CM

## 2019-06-04 DIAGNOSIS — I359 Nonrheumatic aortic valve disorder, unspecified: Secondary | ICD-10-CM

## 2019-06-04 DIAGNOSIS — D709 Neutropenia, unspecified: Secondary | ICD-10-CM

## 2019-06-04 DIAGNOSIS — Z0000 Encounter for general adult medical examination without abnormal findings: Secondary | ICD-10-CM

## 2019-06-04 DIAGNOSIS — R011 Cardiac murmur, unspecified: Secondary | ICD-10-CM

## 2019-06-04 DIAGNOSIS — Z832 Family history of diseases of the blood and blood-forming organs and certain disorders involving the immune mechanism: Secondary | ICD-10-CM

## 2019-06-04 LAB — CBC WITH DIFFERENTIAL/PLATELET
Basophils Absolute: 0 10*3/uL (ref 0.0–0.1)
Basophils Relative: 1.6 % (ref 0.0–3.0)
Eosinophils Absolute: 0.2 10*3/uL (ref 0.0–0.7)
Eosinophils Relative: 7.2 % — ABNORMAL HIGH (ref 0.0–5.0)
HCT: 41 % (ref 36.0–46.0)
Hemoglobin: 13.8 g/dL (ref 12.0–15.0)
Lymphocytes Relative: 37.6 % (ref 12.0–46.0)
Lymphs Abs: 1 10*3/uL (ref 0.7–4.0)
MCHC: 33.6 g/dL (ref 30.0–36.0)
MCV: 97.9 fl (ref 78.0–100.0)
Monocytes Absolute: 0.3 10*3/uL (ref 0.1–1.0)
Monocytes Relative: 12.4 % — ABNORMAL HIGH (ref 3.0–12.0)
Neutro Abs: 1 10*3/uL — ABNORMAL LOW (ref 1.4–7.7)
Neutrophils Relative %: 41.2 % — ABNORMAL LOW (ref 43.0–77.0)
Platelets: 251 10*3/uL (ref 150.0–400.0)
RBC: 4.19 Mil/uL (ref 3.87–5.11)
RDW: 13.9 % (ref 11.5–15.5)
WBC: 2.5 10*3/uL — ABNORMAL LOW (ref 4.0–10.5)

## 2019-06-04 LAB — HEPATIC FUNCTION PANEL
ALT: 20 U/L (ref 0–35)
AST: 23 U/L (ref 0–37)
Albumin: 4.2 g/dL (ref 3.5–5.2)
Alkaline Phosphatase: 74 U/L (ref 39–117)
Bilirubin, Direct: 0.1 mg/dL (ref 0.0–0.3)
Total Bilirubin: 0.8 mg/dL (ref 0.2–1.2)
Total Protein: 7.4 g/dL (ref 6.0–8.3)

## 2019-06-04 LAB — BASIC METABOLIC PANEL
BUN: 19 mg/dL (ref 6–23)
CO2: 30 mEq/L (ref 19–32)
Calcium: 9.6 mg/dL (ref 8.4–10.5)
Chloride: 103 mEq/L (ref 96–112)
Creatinine, Ser: 0.66 mg/dL (ref 0.40–1.20)
GFR: 87.07 mL/min (ref >60.00)
Glucose, Bld: 82 mg/dL (ref 70–99)
Potassium: 4.7 mEq/L (ref 3.5–5.1)
Sodium: 139 mEq/L (ref 135–145)

## 2019-06-04 LAB — LIPID PANEL
Cholesterol: 272 mg/dL — ABNORMAL HIGH (ref 0–200)
HDL: 99.1 mg/dL (ref >39.00)
LDL Cholesterol: 161 mg/dL — ABNORMAL HIGH (ref 0–99)
NonHDL: 173.26
Total CHOL/HDL Ratio: 3
Triglycerides: 60 mg/dL (ref 0.0–149.0)
VLDL: 12 mg/dL (ref 0.0–40.0)

## 2019-06-04 LAB — TSH: TSH: 1.59 u[IU]/mL (ref 0.35–4.50)

## 2019-06-04 NOTE — Patient Instructions (Addendum)
Glad you are  Doing well . Will get a follow up echo cardiogram   As discussed  If all ok then yearly cpx and labs      Health Maintenance, Female Adopting a healthy lifestyle and getting preventive care are important in promoting health and wellness. Ask your health care provider about:  The right schedule for you to have regular tests and exams.  Things you can do on your own to prevent diseases and keep yourself healthy. What should I know about diet, weight, and exercise? Eat a healthy diet   Eat a diet that includes plenty of vegetables, fruits, low-fat dairy products, and lean protein.  Do not eat a lot of foods that are high in solid fats, added sugars, or sodium. Maintain a healthy weight Body mass index (BMI) is used to identify weight problems. It estimates body fat based on height and weight. Your health care provider can help determine your BMI and help you achieve or maintain a healthy weight. Get regular exercise Get regular exercise. This is one of the most important things you can do for your health. Most adults should:  Exercise for at least 150 minutes each week. The exercise should increase your heart rate and make you sweat (moderate-intensity exercise).  Do strengthening exercises at least twice a week. This is in addition to the moderate-intensity exercise.  Spend less time sitting. Even light physical activity can be beneficial. Watch cholesterol and blood lipids Have your blood tested for lipids and cholesterol at 76 years of age, then have this test every 5 years. Have your cholesterol levels checked more often if:  Your lipid or cholesterol levels are high.  You are older than 76 years of age.  You are at high risk for heart disease. What should I know about cancer screening? Depending on your health history and family history, you may need to have cancer screening at various ages. This may include screening for:  Breast cancer.  Cervical cancer.   Colorectal cancer.  Skin cancer.  Lung cancer. What should I know about heart disease, diabetes, and high blood pressure? Blood pressure and heart disease  High blood pressure causes heart disease and increases the risk of stroke. This is more likely to develop in people who have high blood pressure readings, are of African descent, or are overweight.  Have your blood pressure checked: ? Every 3-5 years if you are 4-27 years of age. ? Every year if you are 14 years old or older. Diabetes Have regular diabetes screenings. This checks your fasting blood sugar level. Have the screening done:  Once every three years after age 65 if you are at a normal weight and have a low risk for diabetes.  More often and at a younger age if you are overweight or have a high risk for diabetes. What should I know about preventing infection? Hepatitis B If you have a higher risk for hepatitis B, you should be screened for this virus. Talk with your health care provider to find out if you are at risk for hepatitis B infection. Hepatitis C Testing is recommended for:  Everyone born from [birth year] through 1965.  Anyone with known risk factors for hepatitis C. Sexually transmitted infections (STIs)  Get screened for STIs, including gonorrhea and chlamydia, if: ? You are sexually active and are younger than 76 years of age. ? You are older than 76 years of age and your health care provider tells you that you are at risk  for this type of infection. ? Your sexual activity has changed since you were last screened, and you are at increased risk for chlamydia or gonorrhea. Ask your health care provider if you are at risk.  Ask your health care provider about whether you are at high risk for HIV. Your health care provider may recommend a prescription medicine to help prevent HIV infection. If you choose to take medicine to prevent HIV, you should first get tested for HIV. You should then be tested every 3 months  for as long as you are taking the medicine. Pregnancy  If you are about to stop having your period (premenopausal) and you may become pregnant, seek counseling before you get pregnant.  Take 400 to 800 micrograms (mcg) of folic acid every day if you become pregnant.  Ask for birth control (contraception) if you want to prevent pregnancy. Osteoporosis and menopause Osteoporosis is a disease in which the bones lose minerals and strength with aging. This can result in bone fractures. If you are 83 years old or older, or if you are at risk for osteoporosis and fractures, ask your health care provider if you should:  Be screened for bone loss.  Take a calcium or vitamin D supplement to lower your risk of fractures.  Be given hormone replacement therapy (HRT) to treat symptoms of menopause. Follow these instructions at home: Lifestyle  Do not use any products that contain nicotine or tobacco, such as cigarettes, e-cigarettes, and chewing tobacco. If you need help quitting, ask your health care provider.  Do not use street drugs.  Do not share needles.  Ask your health care provider for help if you need support or information about quitting drugs. Alcohol use  Do not drink alcohol if: ? Your health care provider tells you not to drink. ? You are pregnant, may be pregnant, or are planning to become pregnant.  If you drink alcohol: ? Limit how much you use to 0-1 drink a day. ? Limit intake if you are breastfeeding.  Be aware of how much alcohol is in your drink. In the [place] one drink equals one 12 oz bottle of beer (355 mL), one 5 oz glass of wine (148 mL), or one 1 oz glass of hard liquor (44 mL). General instructions  Schedule regular health, dental, and eye exams.  Stay current with your vaccines.  Tell your health care provider if: ? You often feel depressed. ? You have ever been abused or do not feel safe at home. Summary  Adopting a healthy lifestyle and getting  preventive care are important in promoting health and wellness.  Follow your health care provider's instructions about healthy diet, exercising, and getting tested or screened for diseases.  Follow your health care provider's instructions on monitoring your cholesterol and blood pressure. This information is not intended to replace advice given to you by your health care provider. Make sure you discuss any questions you have with your health care provider. Document Released: 04/01/2011 Document Revised: 09/09/2018 Document Reviewed: 09/09/2018 Elsevier Patient Education  2020 [employer].

## 2019-06-11 ENCOUNTER — Other Ambulatory Visit: Payer: Self-pay

## 2019-06-11 ENCOUNTER — Ambulatory Visit (HOSPITAL_COMMUNITY): Payer: PPO | Attending: Internal Medicine

## 2019-06-11 DIAGNOSIS — I10 Essential (primary) hypertension: Secondary | ICD-10-CM

## 2019-06-11 DIAGNOSIS — I359 Nonrheumatic aortic valve disorder, unspecified: Secondary | ICD-10-CM | POA: Diagnosis not present

## 2019-06-11 DIAGNOSIS — H25043 Posterior subcapsular polar age-related cataract, bilateral: Secondary | ICD-10-CM | POA: Diagnosis not present

## 2019-06-11 DIAGNOSIS — H2511 Age-related nuclear cataract, right eye: Secondary | ICD-10-CM | POA: Diagnosis not present

## 2019-06-11 DIAGNOSIS — R011 Cardiac murmur, unspecified: Secondary | ICD-10-CM

## 2019-06-11 DIAGNOSIS — H2513 Age-related nuclear cataract, bilateral: Secondary | ICD-10-CM | POA: Diagnosis not present

## 2019-06-11 DIAGNOSIS — H18413 Arcus senilis, bilateral: Secondary | ICD-10-CM | POA: Diagnosis not present

## 2019-06-11 DIAGNOSIS — H25013 Cortical age-related cataract, bilateral: Secondary | ICD-10-CM | POA: Diagnosis not present

## 2019-06-16 ENCOUNTER — Other Ambulatory Visit: Payer: Self-pay | Admitting: Family Medicine

## 2019-06-17 NOTE — Telephone Encounter (Signed)
Dr Panosh pt 

## 2019-06-18 ENCOUNTER — Other Ambulatory Visit: Payer: Self-pay | Admitting: Internal Medicine

## 2019-06-18 MED ORDER — LEVOTHYROXINE SODIUM 88 MCG PO TABS: 88.0000 ug | ORAL_TABLET | Freq: Every day | ORAL | 0 refills | Status: DC

## 2019-06-18 NOTE — Telephone Encounter (Signed)
Medication: levothyroxine (SYNTHROID) 88 MCG tablet [id number]   Has the patient contacted their pharmacy? Yes  (Agent: If no, request that the patient contact the pharmacy for the refill.) (Agent: If yes, when and what did the pharmacy advise?)  Preferred Pharmacy (with phone number or street name): [id number] - [place], [address] [phone] (Phone) [phone] (Fax)    Agent: Please be advised that RX refills may take up to 3 business days. We ask that you follow-up with your pharmacy.

## 2019-06-18 NOTE — Telephone Encounter (Signed)
Patient is calling to check on the status of the medication it was sent to Dr. Volanda Napoleon instead of Dr. Regis Bill, the patient's doctor. Please advise  [facility] [place]

## 2019-07-02 DIAGNOSIS — H2511 Age-related nuclear cataract, right eye: Secondary | ICD-10-CM | POA: Diagnosis not present

## 2019-07-02 DIAGNOSIS — H2512 Age-related nuclear cataract, left eye: Secondary | ICD-10-CM | POA: Diagnosis not present

## 2019-07-16 DIAGNOSIS — H2512 Age-related nuclear cataract, left eye: Secondary | ICD-10-CM | POA: Diagnosis not present

## 2019-07-16 DIAGNOSIS — Z961 Presence of intraocular lens: Secondary | ICD-10-CM | POA: Diagnosis not present

## 2019-07-20 ENCOUNTER — Other Ambulatory Visit: Payer: Self-pay | Admitting: Internal Medicine

## 2019-07-20 DIAGNOSIS — Z1231 Encounter for screening mammogram for malignant neoplasm of breast: Secondary | ICD-10-CM

## 2019-09-06 ENCOUNTER — Ambulatory Visit
Admission: RE | Admit: 2019-09-06 | Discharge: 2019-09-06 | Disposition: A | Discharge status: Home / Self Care | Payer: PPO | Source: Ambulatory Visit | Attending: Internal Medicine | Admitting: Internal Medicine

## 2019-09-06 ENCOUNTER — Other Ambulatory Visit: Payer: Self-pay

## 2019-09-06 DIAGNOSIS — Z1231 Encounter for screening mammogram for malignant neoplasm of breast: Secondary | ICD-10-CM | POA: Diagnosis not present

## 2019-10-10 ENCOUNTER — Ambulatory Visit: Payer: Medicare Other | Attending: Internal Medicine

## 2019-10-10 DIAGNOSIS — Z23 Encounter for immunization: Secondary | ICD-10-CM | POA: Insufficient documentation

## 2019-10-10 NOTE — Progress Notes (Signed)
   Covid-19 Vaccination Clinic  Name:  Crystal Odom    MRN: [id number] DOB: Jun 05, 1941  10/10/2019  Crystal Odom was observed post Covid-19 immunization for 15 minutes without incidence. She was provided with Vaccine Information Sheet and instruction to access the V-Safe system.   Crystal Odom was instructed to call 911 with any severe reactions post vaccine: Marland Kitchen Difficulty breathing  . Swelling of your face and throat  . A fast heartbeat  . A bad rash all over your body  . Dizziness and weakness    Immunizations Administered    Name Date Dose VIS Date Route   Pfizer COVID-19 Vaccine 10/10/2019  2:06 PM 0.3 mL 09/10/2019 Intramuscular   Manufacturer: [place]   Lot: [id number]   [place]: [id number]

## 2019-10-31 ENCOUNTER — Ambulatory Visit: Payer: PPO | Attending: Internal Medicine

## 2019-10-31 DIAGNOSIS — Z23 Encounter for immunization: Secondary | ICD-10-CM | POA: Insufficient documentation

## 2019-10-31 NOTE — Progress Notes (Signed)
   Covid-19 Vaccination Clinic  Name:  [name]    MRN: [id number] DOB: 1940-12-25  10/31/2019  [name] was observed post Covid-19 immunization for 30 minutes based on pre-vaccination screening without incidence. She was provided with Vaccine Information Sheet and instruction to access the V-Safe system.   [name] was instructed to call 911 with any severe reactions post vaccine: Marland Kitchen Difficulty breathing  . Swelling of your face and throat  . A fast heartbeat  . A bad rash all over your body  . Dizziness and weakness    Immunizations Administered    Name Date Dose VIS Date Route   Pfizer COVID-19 Vaccine 10/31/2019  1:16 PM 0.3 mL 09/10/2019 Intramuscular   Manufacturer: [employer]   Lot: [id number]   [place]: [id number]

## 2019-11-01 ENCOUNTER — Other Ambulatory Visit: Payer: Self-pay | Admitting: Internal Medicine

## 2020-01-27 ENCOUNTER — Other Ambulatory Visit: Payer: Self-pay | Admitting: Internal Medicine

## 2020-02-07 DIAGNOSIS — M1712 Unilateral primary osteoarthritis, left knee: Secondary | ICD-10-CM | POA: Diagnosis not present

## 2020-04-30 ENCOUNTER — Other Ambulatory Visit: Payer: Self-pay | Admitting: Internal Medicine

## 2020-05-08 ENCOUNTER — Other Ambulatory Visit: Payer: Self-pay | Admitting: Internal Medicine

## 2020-06-12 ENCOUNTER — Ambulatory Visit: Payer: PPO | Admitting: Internal Medicine

## 2020-06-16 NOTE — Telephone Encounter (Signed)
Plan  Ov next week in person.

## 2020-06-20 ENCOUNTER — Other Ambulatory Visit: Payer: Self-pay

## 2020-06-21 ENCOUNTER — Ambulatory Visit (INDEPENDENT_AMBULATORY_CARE_PROVIDER_SITE_OTHER): Payer: PPO | Admitting: Internal Medicine

## 2020-06-21 ENCOUNTER — Encounter: Payer: Self-pay | Admitting: Internal Medicine

## 2020-06-21 VITALS — BP 126/78 | HR 60 | Temp 98.0°F | Ht 64.5 in | Wt 128.6 lb

## 2020-06-21 DIAGNOSIS — Z79899 Other long term (current) drug therapy: Secondary | ICD-10-CM | POA: Diagnosis not present

## 2020-06-21 DIAGNOSIS — R42 Dizziness and giddiness: Secondary | ICD-10-CM

## 2020-06-21 DIAGNOSIS — E039 Hypothyroidism, unspecified: Secondary | ICD-10-CM

## 2020-06-21 DIAGNOSIS — I10 Essential (primary) hypertension: Secondary | ICD-10-CM | POA: Diagnosis not present

## 2020-06-21 DIAGNOSIS — D709 Neutropenia, unspecified: Secondary | ICD-10-CM | POA: Diagnosis not present

## 2020-06-21 DIAGNOSIS — E785 Hyperlipidemia, unspecified: Secondary | ICD-10-CM | POA: Diagnosis not present

## 2020-06-21 NOTE — Progress Notes (Signed)
Chief Complaint  Patient presents with  . Dizziness    light headed, started about 2 or 3 weeks ago    HPI: Crystal Odom 79 y.o. come in for lightheadedness onset about 2-1/2 weeks ago she describes it as is what sounds like an orthostatic dizziness or lightheadedness without vertigo or syncope no chest pain shortness of breath however  One episode noted  Pulse was 147 on machine bp   Blood pressures generally running controlled or lower 1 teens occasionally 90 systolic. Otherwise no change in exercise tolerance able to do yoga that does not bring it on. No other major change in health. ROS: See pertinent positives and negatives per HPI. No syncoep illnes assoc  And no seemingly rhyme or reason of problem   Past Medical History:  Diagnosis Date  . 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 Arthritis    left knee  . Diplopia 2002   eval neg  . Hyperlipidemia    HDL over 100  . Hypothyroidism   . Kidney stones    in past  . Leukopenia    with neg hemevaluation bm bx get wbc diff q 6 months  . UTI (lower urinary tract infection)     Family History  Problem Relation Age of Onset  . Other Father        major renal failure; SCCA in ear  . Osteoarthritis Father   . Heart disease Father   . Heart disease Mother        84  . Autoimmune disease Sister   . Autoimmune disease Brother   . Stroke Maternal Grandmother   . Thyroid disease Other   . Stroke Other   . Osteoporosis Other     Social History   Socioeconomic History  . Marital status: Married    Spouse name: Not on file  . Number of children: Not on file  . Years of education: Not on file  . Highest education level: Not on file  Occupational History  . Not on file  Tobacco Use  . Smoking status: Never Smoker  . Smokeless tobacco: Never Used  Vaping Use  . Vaping Use: Never used  Substance and Sexual Activity  . Alcohol use: Yes    Alcohol/week: 2.0 -  3.0 standard drinks    Types: 2 - 3 Glasses of wine per week    Comment: 2 to 3 glasses a week   . Drug use: No  . Sexual activity: Not on file  Other Topics Concern  . Not on file  Social History Narrative   Married   Retired  2008   Regular exercise-yes   [place]   No pets   Social etoh   G2P2      Husband [place] CABG 2012   Travel grandchildren    Social Determinants of Health   Financial Resource Strain:   . Difficulty of Paying Living Expenses: Not on file  Food Insecurity:   . Worried About Charity fundraiser in the Last Year: Not on file  . Ran Out of Food in the Last Year: Not on file  Transportation Needs:   . Lack of Transportation (Medical): Not on file  . Lack of Transportation (Non-Medical): Not on file  Physical Activity:   . Days of Exercise per Week: Not on file  . Minutes of Exercise per Session: Not on file  Stress:   . Feeling of Stress :  Not on file  Social Connections:   . Frequency of Communication with Friends and Family: Not on file  . Frequency of Social Gatherings with Friends and Family: Not on file  . Attends Religious Services: Not on file  . Active Member of Clubs or Organizations: Not on file  . Attends Archivist Meetings: Not on file  . Marital Status: Not on file    Outpatient Medications Prior to Visit  Medication Sig Dispense Refill  . aspirin 81 MG tablet Take 81 mg by mouth daily.      . cholecalciferol (VITAMIN D3) 25 MCG (1000 UNIT) tablet Take 2,000 Units by mouth daily.     Marland Kitchen levothyroxine (SYNTHROID) 88 MCG tablet TAKE 1 TABLET BY MOUTH EVERY DAY 90 tablet 0  . lisinopril (ZESTRIL) 10 MG tablet TAKE 1 TABLET BY MOUTH EVERY DAY 90 tablet 0   No facility-administered medications prior to visit.     EXAM:  BP 126/78   Pulse 60   Temp 98 F (36.7 C) (Oral)   Ht 5' 4.5" (1.638 m)   Wt 128 lb 9.6 oz (58.3 kg)   SpO2 94%   BMI 21.73 kg/m   Body mass index is 21.73 kg/m. Patient's blood pressure machine  114/74. GENERAL: vitals reviewed and listed above, alert, oriented, appears well hydrated and in no acute distress HEENT: atraumatic, conjunctiva  clear, no obvious abnormalities on inspection of external nose and ears OP :masked  NECK: no obvious masses on inspection palpation  LUNGS: clear to auscultation bilaterally, no wheezes, rales or rhonchi, good air movement CV: HRRR, no clubbing cyanosis or  peripheral edema nl cap refill  Short lusb syt m no g or click Abdomen:  Sof,t normal bowel sounds without hepatosplenomegaly, no guarding rebound or masses no CVA tenderness MS: moves all extremities without noticeable focal  abnormality PSYCH: pleasant and cooperative, no obvious depression or anxiety Lab Results  Component Value Date   WBC 2.5 (L) 06/04/2019   HGB 13.8 06/04/2019   HCT 41.0 06/04/2019   PLT 251.0 06/04/2019   GLUCOSE 82 06/04/2019   CHOL 272 (H) 06/04/2019   TRIG 60.0 06/04/2019   HDL 99.10 06/04/2019   LDLDIRECT 149.6 02/02/2013   LDLCALC 161 (H) 06/04/2019   ALT 20 06/04/2019   AST 23 06/04/2019   NA 139 06/04/2019   K 4.7 06/04/2019   CL 103 06/04/2019   CREATININE 0.66 06/04/2019   BUN 19 06/04/2019   CO2 30 06/04/2019   TSH 1.59 06/04/2019   INR 0.91 11/10/2009   HGBA1C 5.6 06/02/2018   BP Readings from Last 3 Encounters:  06/21/20 126/78  06/04/19 120/62  06/02/18 118/70  her bp machine correlated reasonable with office  EKG shows rate of 68 read as atrial rhythm flat piece no abnormal intervals. ASSESSMENT AND PLAN:  Discussed the following assessment and plan:  Episodic lightheadedness - Plan: EKG 12-Lead, TSH, Hepatic function panel, Lipid panel, BASIC METABOLIC PANEL WITH GFR, CBC with Differential/Platelet, T4, free  Medication management - Plan: TSH, Hepatic function panel, Lipid panel, BASIC METABOLIC PANEL WITH GFR, CBC with Differential/Platelet, T4, free  Essential hypertension - Plan: TSH, Hepatic function panel, Lipid panel, BASIC  METABOLIC PANEL WITH GFR, CBC with Differential/Platelet, T4, free  Hyperlipidemia with target LDL less than 100 - Plan: TSH, Hepatic function panel, Lipid panel, BASIC METABOLIC PANEL WITH GFR, CBC with Differential/Platelet, T4, free  Neutropenia, unspecified type (HCC) - Plan: TSH, Hepatic function panel, Lipid panel, BASIC METABOLIC PANEL WITH  GFR, CBC with Differential/Platelet, T4, free  Hypothyroidism, unspecified type - Plan: TSH, Hepatic function panel, Lipid panel, BASIC METABOLIC PANEL WITH GFR, CBC with Differential/Platelet, T4, free One Episode of rapid heart rate  147 range    Not always assoc with the  Episodes of light headedness which seems to be orthostatic.   Plan fasting lab and cards fu ? If Zio patch echo indicated 'weill refer back to dr Tamala Julian who saw her  3 years ago for the elevated lipids with cac score of 0  See below for plan  And keep cpx appt   -Patient advised to return or notify health care team  if  new concerns arise.  Patient Instructions  Stop the lisinopril for 2 to 3 days and if blood pressure is over 110 or thereabouts restart at 5 mg a day. Monitor blood pressure and pulse when you're having symptoms. Stay hydrated Appointment for fasting lab work as we discussed It is possible your symptoms were from blood pressure being too low but want to consider making sure you are having an electrical rhythm change causing the low blood pressure. Your exam is good today. You should be contacted about a cardiology appointment.     Standley Brooking. Dearl Rudden M.D.

## 2020-06-21 NOTE — Patient Instructions (Signed)
Stop the lisinopril for 2 to 3 days and if blood pressure is over 110 or thereabouts restart at 5 mg a day. Monitor blood pressure and pulse when you're having symptoms. Stay hydrated Appointment for fasting lab work as we discussed It is possible your symptoms were from blood pressure being too low but want to consider making sure you are having an electrical rhythm change causing the low blood pressure. Your exam is good today. You should be contacted about a cardiology appointment.

## 2020-06-22 ENCOUNTER — Other Ambulatory Visit (INDEPENDENT_AMBULATORY_CARE_PROVIDER_SITE_OTHER): Payer: PPO

## 2020-06-22 ENCOUNTER — Other Ambulatory Visit: Payer: Self-pay

## 2020-06-22 DIAGNOSIS — I10 Essential (primary) hypertension: Secondary | ICD-10-CM

## 2020-06-22 DIAGNOSIS — R42 Dizziness and giddiness: Secondary | ICD-10-CM

## 2020-06-22 DIAGNOSIS — E039 Hypothyroidism, unspecified: Secondary | ICD-10-CM | POA: Diagnosis not present

## 2020-06-22 DIAGNOSIS — Z79899 Other long term (current) drug therapy: Secondary | ICD-10-CM

## 2020-06-22 DIAGNOSIS — E785 Hyperlipidemia, unspecified: Secondary | ICD-10-CM

## 2020-06-22 DIAGNOSIS — D709 Neutropenia, unspecified: Secondary | ICD-10-CM

## 2020-06-22 NOTE — Telephone Encounter (Signed)
I am not familiar  With dr Johney Frame but   I would keep that appt.   The  practice has excellent cardiologists  Do not have reservations . and we need an  opinion sooner  than November .

## 2020-06-23 LAB — CBC WITH DIFFERENTIAL/PLATELET
Absolute Monocytes: 360 cells/uL (ref 200–950)
Basophils Absolute: 50 cells/uL (ref 0–200)
Basophils Relative: 2.1 %
Eosinophils Absolute: 180 cells/uL (ref 15–500)
Eosinophils Relative: 7.5 %
HCT: 39.9 % (ref 35.0–45.0)
Hemoglobin: 13.3 g/dL (ref 11.7–15.5)
Lymphs Abs: 739 cells/uL — ABNORMAL LOW (ref 850–3900)
MCH: 32.5 pg (ref 27.0–33.0)
MCHC: 33.3 g/dL (ref 32.0–36.0)
MCV: 97.6 fL (ref 80.0–100.0)
MPV: 11 fL (ref 7.5–12.5)
Monocytes Relative: 15 %
Neutro Abs: 1070 cells/uL — ABNORMAL LOW (ref 1500–7800)
Neutrophils Relative %: 44.6 %
Platelets: 272 10*3/uL (ref 140–400)
RBC: 4.09 10*6/uL (ref 3.80–5.10)
RDW: 12.3 % (ref 11.0–15.0)
Total Lymphocyte: 30.8 %
WBC: 2.4 10*3/uL — ABNORMAL LOW (ref 3.8–10.8)

## 2020-06-23 LAB — HEPATIC FUNCTION PANEL
AG Ratio: 1.4 (calc) (ref 1.0–2.5)
ALT: 20 U/L (ref 6–29)
AST: 23 U/L (ref 10–35)
Albumin: 4.1 g/dL (ref 3.6–5.1)
Alkaline phosphatase (APISO): 71 U/L (ref 37–153)
Bilirubin, Direct: 0.1 mg/dL (ref 0.0–0.2)
Globulin: 3 g/dL (ref 1.9–3.7)
Indirect Bilirubin: 0.6 mg/dL (ref 0.2–1.2)
Total Bilirubin: 0.7 mg/dL (ref 0.2–1.2)
Total Protein: 7.1 g/dL (ref 6.1–8.1)

## 2020-06-23 LAB — BASIC METABOLIC PANEL WITH GFR
BUN: 18 mg/dL (ref 7–25)
CO2: 32 mmol/L (ref 20–32)
Calcium: 9.7 mg/dL (ref 8.6–10.4)
Chloride: 103 mmol/L (ref 98–110)
Creat: 0.64 mg/dL (ref 0.60–0.93)
GFR, Est African American: 100 mL/min/{1.73_m2} (ref >=60)
GFR, Est Non African American: 86 mL/min/{1.73_m2} (ref >=60)
Glucose, Bld: 83 mg/dL (ref 65–99)
Potassium: 4.8 mmol/L (ref 3.5–5.3)
Sodium: 140 mmol/L (ref 135–146)

## 2020-06-23 LAB — LIPID PANEL
Cholesterol: 278 mg/dL — ABNORMAL HIGH (ref <200)
HDL: 101 mg/dL (ref >=50)
LDL Cholesterol (Calc): 161 mg/dL (calc) — ABNORMAL HIGH
Non-HDL Cholesterol (Calc): 177 mg/dL (calc) — ABNORMAL HIGH (ref <130)
Total CHOL/HDL Ratio: 2.8 (calc) (ref <5.0)
Triglycerides: 63 mg/dL (ref <150)

## 2020-06-23 LAB — T4, FREE: Free T4: 1.6 ng/dL (ref 0.8–1.8)

## 2020-06-23 LAB — TSH: TSH: 1.68 m[IU]/L (ref 0.40–4.50)

## 2020-06-23 NOTE — Progress Notes (Signed)
Results stable or normal   Including thyroid and blood sugar

## 2020-07-05 NOTE — Progress Notes (Signed)
Cardiology Office Note:    Date:  07/07/2020   ID:  Crystal Odom, DOB 1941-02-18, MRN [id number]  PCP:  Burnis Medin, MD  [place] Cardiologist:  No primary care provider on file.  [facility] Electrophysiologist:  None   Referring MD: Burnis Medin, MD    History of Present Illness:    Crystal Odom is a 79 y.o. female with a hx of HTN, mild AS and HLD who was referred by Dr. Regis Bill for lightheadedness.  Patient states that she has been having intermittent lightheadedness for several weeks. Symptoms are mild and last only a couple of minutes. No syncope, chest pain, SOB, nausea or diaphoresis. Notices it the most when has been sitting for a long period of time and gets up to move around. Occasionally has to sit down to get the symptoms to go away, which they do. Had occasional palpitations associated with it, but also notes that she has periods of palpitations without lightheadedness when she is just standing in her kitchen. Specifically, HR went to 140s on one episode where baseline is 50-60s. This lasted about 19mnutes. She then had a second episode that lasted about 1 hour and she thinks abated when she ate some food. Last episode 10days ago. Saw PCP and she stopped the lisinopril. She notes that she still has the intermittent lightheadedness and has not noticed much of a difference.  Otherwise, the patient feels well. She is very active and walks every morning for 355mutes. No symptoms at all during walking. No chest pain, SOB, lightheadedness, or palpitations. No personal history of heart disease. Family history notable for HTN and HLD in her parents.   Cardiac CT calcium score was 0 in 2017  Blood pressure at home 108/60s; sometimes below 100 but not symptomatic at that time. Off lisinopril, blood pressure has been well controlled in 110s/70s.  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 neutropenia   Arthritis    left knee   Diplopia 2002   eval neg   Hyperlipidemia    HDL over 100   Hypothyroidism    Kidney stones    in past   Leukopenia    with neg hemevaluation bm bx get wbc diff q 6 months   UTI (lower urinary tract infection)     Past Surgical History:  Procedure Laterality Date   BONE MARROW BIOPSY     low WBC   kidney stone removal     left arthroscopic  2005   left knee   LIVER BIOPSY     reported normal   TONSILLECTOMY     TUBAL LIGATION      Current Medications: Current Meds  Medication Sig   cholecalciferol (VITAMIN D3) 25 MCG (1000 UNIT) tablet Take 2,000 Units by mouth daily.    levothyroxine (SYNTHROID) 88 MCG tablet TAKE 1 TABLET BY MOUTH EVERY DAY   [DISCONTINUED] aspirin 81 MG tablet Take 81 mg by mouth daily.       Allergies:   Penicillins   Social History   Socioeconomic History   Marital status: Married    Spouse name: Not on file   Number of children: Not on file   Years of education: Not on file   Highest education  level: Not on file  Occupational History   Not on file  Tobacco Use   Smoking status: Never Smoker   Smokeless tobacco: Never Used  Vaping Use   Vaping Use: Never used  Substance and Sexual Activity   Alcohol use: Yes    Alcohol/week: 2.0 - 3.0 standard drinks    Types: 2 - 3 Glasses of wine per week    Comment: 2 to 3 glasses a week    Drug use: No   Sexual activity: Not on file  Other Topics Concern   Not on file  Social History Narrative   Married   Retired  2008   Regular exercise-yes   [place] of 2   No pets   Social etoh   [place] CABG 2012   Travel grandchildren    Social Determinants of Health   Financial Resource Strain:    Difficulty of Paying Living Expenses: Not on file  Food Insecurity:    Worried  About Charity fundraiser in the Last Year: Not on file   [employer] of Food in the Last Year: Not on file  Transportation Needs:    Lack of Transportation (Medical): Not on file   Lack of Transportation (Non-Medical): Not on file  Physical Activity:    Days of Exercise per Week: Not on file   Minutes of Exercise per Session: Not on file  Stress:    Feeling of Stress : Not on file  Social Connections:    Frequency of Communication with Friends and Family: Not on file   Frequency of Social Gatherings with Friends and Family: Not on file   Attends Religious Services: Not on file   Active Member of Clubs or Organizations: Not on file   Attends Archivist Meetings: Not on file   Marital Status: Not on file     Family History: The patient's family history includes Autoimmune disease in her brother and sister; Heart disease in her father and mother; Osteoarthritis in her father; Osteoporosis in an other family member; Other in her father; Stroke in her maternal grandmother and another family member; Thyroid disease in an other family member.  ROS:   Please see the history of present illness.    The patient denies chest pain, chest pressure, dyspnea at rest or with exertion, palpitations, PND, orthopnea, or leg swelling. Denies cough, fever, chills. Denies nausea, vomiting. Denies syncope. Denies snoring.  EKGs/Labs/Other Studies Reviewed:    The following studies were reviewed today: Coronary CT scan 2017: IMPRESSION: Coronary calcium score of 0. Some calcification of aortic valve suggest clinical / echo correlation   TTE 06/11/2019: IMPRESSIONS  1. The left ventricle has hyperdynamic systolic function, with an  ejection fraction of >65%. The cavity size was normal. There is mild  eccentric left ventricular hypertrophy. Left ventricular diastolic Doppler  parameters are indeterminate. The E/e' is  9. No evidence of left ventricular regional wall motion  abnormalities.  2. The right ventricle has normal systolic function. The cavity was  normal. There is no increase in right ventricular wall thickness. Right  ventricular systolic pressure is normal.  3. There is systolic bowing of the mitral leaflets, without true  prolapse.  4. The aortic valve is tricuspid. Mild thickening of the aortic valve.  Sclerosis without any evidence of stenosis of the aortic valve. Mild  stenosis of the aortic valve.  5. Focal calcification of noncoronary cusp.  6. The aorta is normal  unless otherwise noted.  7. The aortic root, ascending aorta and aortic arch are normal in size  and structure.   EKG:   ECG 05/2020 NSR with HR 68. No ischemia. No block.   Recent Labs: 06/22/2020: ALT 20; BUN 18; Creat 0.64; Hemoglobin 13.3; Platelets 272; Potassium 4.8; Sodium 140; TSH 1.68  Recent Lipid Panel    Component Value Date/Time   CHOL 278 (H) 06/22/2020 0739   TRIG 63 06/22/2020 0739   HDL 101 06/22/2020 0739   CHOLHDL 2.8 06/22/2020 0739   VLDL 12.0 06/04/2019 0906   LDLCALC 161 (H) 06/22/2020 0739   LDLDIRECT 149.6 02/02/2013 1003    Physical Exam:    VS:  BP (!) 149/87    Pulse 70    Ht '5\' 5"'  (1.651 m)    Wt 128 lb 9.6 oz (58.3 kg)    SpO2 97%    BMI 21.40 kg/m     Wt Readings from Last 3 Encounters:  07/07/20 128 lb 9.6 oz (58.3 kg)  06/21/20 128 lb 9.6 oz (58.3 kg)  06/04/19 125 lb 12.8 oz (57.1 kg)     GEN:  Well nourished, well developed in no acute distress HEENT: Normal NECK: No JVD; No carotid bruits LYMPHATICS: No lymphadenopathy CARDIAC: RRR, 1/6 systolic murmur best heard at RUSB, no rubs or gallops RESPIRATORY:  Clear to auscultation without rales, wheezing or rhonchi  ABDOMEN: Soft, non-tender, non-distended MUSCULOSKELETAL:  No edema; No deformity  SKIN: Warm and dry NEUROLOGIC:  Alert and oriented x 3 PSYCHIATRIC:  Normal affect   ASSESSMENT:    1. Palpitations    PLAN:    In order of problems listed  above:  #Lightheadedness: Patient with intermittent episodes of lightheadedness that appears to be positional in nature. Mainly occurs when going from seated to standing position and resolves with sitting back down and moving more slowly. No syncope. Has some intermittent palpitations that sometimes do and sometimes do not correlate with lightheadedness. PCP stopped lisinopril. Bps at home 110s/70s.  -TTE 06/2019 with normal BiV function, mild LVH, mild AS -Orthostatics negative today but given history of positional lightheadedness, do suspect orthostatic hypotension at home -Agree with holding lisinopril -Counseled about adequate fluid and salt intake -Compression therapy with stockings and/or abdominal binder -Counseled about countermaneuvers including laying down with her legs up if she feels like she is going to pass out  #Palpitations: Patient reports intermittent episodes of palpitations with HR 140s. Lasted about 30-91mn before resolving. Sometimes correlated with lightheadedness episodes as detailed above but sometimes occurred unprovoked. Last episode about 10 days ago. No associated chest pain, SOB, syncope. Concern for SVT vs Afib.  -Plan for zio patch x 14 days -Given improvement with eating food (? Vagal response), counseled on vagal maneuvers as if SVT this may break the arrhythmia -TTE in 2020 with normal LVEF, mild AS, no other significant valvular disease -Counseled that if her palpitations fail to resolve over several hours or if she feels like she is going to pass out, has chest pain, or SOB to go to the ER at that time for further management  #Elevated LDL: LDL 161, HDL 101. ASCVD risk score 15.2%. Coronary calcium score in 2017 0.  -Benefits and risk of statin therapy discussed with the patient at length and she would like to lower her CV risk at this time with statin -Start crestor 567mdaily; repeat cholesterol panel in 3 months with PCP -Okay to stop ASA 8176ms not  needed for primary  prevention at this time  #HTN: Well controlled off medications at home. -Continue to hold lisinopril 60m given concern for orthostasis -Monitor on home BP cuff -Follow-up with PCP as scheduled   Medication Adjustments/Labs and Tests Ordered: Current medicines are reviewed at length with the patient today.  Concerns regarding medicines are outlined above.  Orders Placed This Encounter  Procedures   LONG TERM MONITOR (3-14 DAYS)   Meds ordered this encounter  Medications   rosuvastatin (CRESTOR) 5 MG tablet    Sig: Take 1 tablet (5 mg total) by mouth daily.    Dispense:  90 tablet    Refill:  3    Patient Instructions  Medication Instructions:   STOP TAKING:  1. LISINOPRIL 10 MG   2. ASA 81 MG   START TAKING:    1.   CRESTOR 5 MG ONCE A DAY    *If you need a refill on your cardiac medications before your next appointment, please call your pharmacy*   Lab Work: NONE ORDERED  TODAY   If you have labs (blood work) drawn today and your tests are completely normal, you will receive your results only by:  MyChart Message (if you have MyChart) OR  A paper copy in the mail If you have any lab test that is abnormal or we need to change your treatment, we will call you to review the results.   Testing/Procedures:Your physician has recommended that you wear an event monitor. Event monitors are medical devices that record the hearts electrical activity. Doctors most often uKoreathese monitors to diagnose arrhythmias. Arrhythmias are problems with the speed or rhythm of the heartbeat. The monitor is a small, portable device. You can wear one while you do your normal daily activities. This is usually used to diagnose what is causing palpitations/syncope (passing out).   Follow-Up: [facility] and your [facility] needs are our priority.  As part of our continuing mission to provide you with exceptional heart care, we have created designated Provider  Care Teams.  These Care Teams include your primary Cardiologist (physician) and Advanced Practice Providers (APPs -  Physician Assistants and Nurse Practitioners) who all work together to provide you with the care you need, when you need it.  We recommend signing up for the patient portal called "MyChart".  Sign up information is provided on this After Visit Summary.  MyChart is used to connect with patients for Virtual Visits (Telemedicine).  Patients are able to view lab/test results, encounter notes, upcoming appointments, etc.  Non-urgent messages can be sent to your provider as well.   To learn more about what you can do with MyChart, go to [email or web address]    Your next appointment:   1 year(s)  The format for your next appointment:   In Person  Provider:   HGwyndolyn Kaufman MD   Other Instructions  ZBryn Gulling Long Term Monitor Instructions   Your physician has requested you wear your ZIO patch monitor___14____days.   This is a single patch monitor.  [employer] supplies one patch monitor per enrollment.  Additional stickers are not available.   Please do not apply patch if you will be having a Nuclear Stress Test, Echocardiogram, Cardiac CT, MRI, or Chest Xray during the time frame you would be wearing the monitor. The patch cannot be worn during these tests.  You cannot remove and re-apply the ZIO XT patch monitor.   Your ZIO patch monitor will be [facility] USPS [facility]  your home address. The monitor may also be mailed to a PO BOX if home delivery is not available.   It may take 3-5 days to receive your monitor after you have been enrolled.   Once you have received you monitor, please review enclosed instructions.  Your monitor has already been registered assigning a specific monitor serial # to you.   Applying the monitor   Shave hair from upper left chest.   Hold abrader disc by orange tab.  Rub abrader in 40 strokes over left upper  chest as indicated in your monitor instructions.   Clean area with 4 enclosed alcohol pads .  Use all pads to assure are is cleaned thoroughly.  Let dry.   Apply patch as indicated in monitor instructions.  Patch will be place under collarbone on left side of chest with arrow pointing upward.   Rub patch adhesive wings for 2 minutes.Remove white label marked "1".  Remove white label marked "2".  Rub patch adhesive wings for 2 additional minutes.   While looking in a mirror, press and release button in center of patch.  A small green light will flash 3-4 times .  This will be your only indicator the monitor has been turned on.     Do not shower for the first 24 hours.  You may shower after the first 24 hours.   Press button if you feel a symptom. You will hear a small click.  Record Date, Time and Symptom in the Patient Log Book.   When you are ready to remove patch, follow instructions on last 2 pages of Patient Log Book.  Stick patch monitor onto last page of Patient Log Book.   Place Patient Log Book in Elim box.  Use locking tab on box and tape box closed securely.  The [employer] has [employer] on it.  Please place in mailbox as soon as possible.  Your physician should have your test results approximately 7 days after the monitor has been mailed back to [facility].   Call [facility] at [phone] if you have questions regarding your ZIO XT patch monitor.  Call them immediately if you see an orange light blinking on your monitor.   If your monitor falls off in less than 4 days contact our Monitor department at [phone].  If your monitor becomes loose or falls off after 4 days call [employer] at [phone] for suggestions on securing your monitor.      Signed, Freada Bergeron, MD  07/07/2020 9:03 AM    [place]

## 2020-07-07 ENCOUNTER — Ambulatory Visit: Payer: PPO | Admitting: Cardiology

## 2020-07-07 ENCOUNTER — Other Ambulatory Visit: Payer: Self-pay

## 2020-07-07 ENCOUNTER — Encounter: Payer: Self-pay | Admitting: Cardiology

## 2020-07-07 ENCOUNTER — Telehealth: Payer: Self-pay | Admitting: Radiology

## 2020-07-07 VITALS — BP 149/87 | HR 70 | Ht 65.0 in | Wt 128.6 lb

## 2020-07-07 DIAGNOSIS — R002 Palpitations: Secondary | ICD-10-CM

## 2020-07-07 DIAGNOSIS — R42 Dizziness and giddiness: Secondary | ICD-10-CM | POA: Diagnosis not present

## 2020-07-07 DIAGNOSIS — I10 Essential (primary) hypertension: Secondary | ICD-10-CM

## 2020-07-07 DIAGNOSIS — E7800 Pure hypercholesterolemia, unspecified: Secondary | ICD-10-CM | POA: Diagnosis not present

## 2020-07-07 MED ORDER — ROSUVASTATIN CALCIUM 5 MG PO TABS: 5.0000 mg | ORAL_TABLET | Freq: Every day | ORAL | 3 refills | Status: DC

## 2020-07-07 NOTE — Patient Instructions (Addendum)
Medication Instructions:   STOP TAKING:  1. LISINOPRIL 10 MG   2. ASA 81 MG   START TAKING:    1.   CRESTOR 5 MG ONCE A DAY    *If you need a refill on your cardiac medications before your next appointment, please call your pharmacy*   Lab Work: NONE ORDERED  TODAY   If you have labs (blood work) drawn today and your tests are completely normal, you will receive your results only by: Marland Kitchen MyChart Message (if you have MyChart) OR . A paper copy in the mail If you have any lab test that is abnormal or we need to change your treatment, we will call you to review the results.   Testing/Procedures:Your physician has recommended that you wear an event monitor. Event monitors are medical devices that record the heart's electrical activity. Doctors most often [place] these monitors to diagnose arrhythmias. Arrhythmias are problems with the speed or rhythm of the heartbeat. The monitor is a small, portable device. You can wear one while you do your normal daily activities. This is usually used to diagnose what is causing palpitations/syncope (passing out).   Follow-Up: At [facility], you and your health needs are our priority.  As part of our continuing mission to provide you with exceptional heart care, we have created designated Provider Care Teams.  These Care Teams include your primary Cardiologist (physician) and Advanced Practice Providers (APPs -  Physician Assistants and Nurse Practitioners) who all work together to provide you with the care you need, when you need it.  We recommend signing up for the patient portal called "MyChart".  Sign up information is provided on this After Visit Summary.  MyChart is used to connect with patients for Virtual Visits (Telemedicine).  Patients are able to view lab/test results, encounter notes, upcoming appointments, etc.  Non-urgent messages can be sent to your provider as well.   To learn more about what you can do with MyChart, go to  [email or web address].    Your next appointment:   1 year(s)  The format for your next appointment:   In Person  Provider:   Gwyndolyn Kaufman, MD   Other Instructions  Bryn Gulling- Long Term Monitor Instructions   Your physician has requested you wear your ZIO patch monitor___14____days.   This is a single patch monitor.  [employer] supplies one patch monitor per enrollment.  Additional stickers are not available.   Please do not apply patch if you will be having a Nuclear Stress Test, Echocardiogram, Cardiac CT, MRI, or Chest Xray during the time frame you would be wearing the monitor. The patch cannot be worn during these tests.  You cannot remove and re-apply the ZIO XT patch monitor.   Your ZIO patch monitor will be sent USPS Priority mail from [facility] directly to your home address. The monitor may also be mailed to a PO BOX if home delivery is not available.   It may take 3-5 days to receive your monitor after you have been enrolled.   Once you have received you monitor, please review enclosed instructions.  Your monitor has already been registered assigning a specific monitor serial # to you.   Applying the monitor   Shave hair from upper left chest.   Hold abrader disc by orange tab.  Rub abrader in 40 strokes over left upper chest as indicated in your monitor instructions.   Clean area with 4 enclosed alcohol pads .  Use all pads to assure are  is cleaned thoroughly.  Let dry.   Apply patch as indicated in monitor instructions.  Patch will be place under collarbone on left side of chest with arrow pointing upward.   Rub patch adhesive wings for 2 minutes.Remove white label marked "1".  Remove white label marked "2".  Rub patch adhesive wings for 2 additional minutes.   While looking in a mirror, press and release button in center of patch.  A small green light will flash 3-4 times .  This will be your only indicator the monitor has been turned on.     Do not  shower for the first 24 hours.  You may shower after the first 24 hours.   Press button if you feel a symptom. You will hear a small click.  Record Date, Time and Symptom in the Patient Log Book.   When you are ready to remove patch, follow instructions on last 2 pages of Patient Log Book.  Stick patch monitor onto last page of Patient Log Book.   Place Patient Log Book in [place] box.  Use locking tab on box and tape box closed securely.  The [employer] has [employer] on it.  Please place in mailbox as soon as possible.  Your physician should have your test results approximately 7 days after the monitor has been mailed back to [facility].   Call [place] at [phone] if you have questions regarding your ZIO XT patch monitor.  Call them immediately if you see an orange light blinking on your monitor.   If your monitor falls off in less than 4 days contact our Monitor department at [phone].  If your monitor becomes loose or falls off after 4 days call [employer] at [phone] for suggestions on securing your monitor.

## 2020-07-07 NOTE — Telephone Encounter (Signed)
Enrolled patient for a 14 day Zio XT  monitor to be mailed to patients home  °

## 2020-07-11 ENCOUNTER — Other Ambulatory Visit (INDEPENDENT_AMBULATORY_CARE_PROVIDER_SITE_OTHER): Payer: PPO

## 2020-07-11 DIAGNOSIS — R002 Palpitations: Secondary | ICD-10-CM

## 2020-07-24 NOTE — Progress Notes (Signed)
Chief Complaint  Patient presents with  . Annual Exam    no problems    HPI: Crystal Odom 79 y.o. comes in today for Preventive Medicare exam/ wellness visit .Since last visit. Saw cards  Began crestor  Stopped acei  Concern about orthostatics  Dizziness seems to have gone with  Dc  med.  bp up in afternoon  Low in am . Feeling fine otherwise / Stopped asa.  Thyroid no change  No fevers weight loss  Health Maintenance  Topic Date Due  . Hepatitis C Screening  Never done  . TETANUS/TDAP  02/03/2023  . INFLUENZA VACCINE  Completed  . DEXA SCAN  Completed  . COVID-19 Vaccine  Completed  . PNA vac Low Risk Adult  Completed   Health Maintenance Review LIFESTYLE:  Exercise:  Weight bearing  Tobacco/ETS: n Alcohol:  Sugar beverages: n Sleep:ok Drug use: no HH:2    Hearing:  Aids   Vision:  No limitations at present . Last eye check UTD  Safety:  Has smoke detector and wears seat belts.   No excess sun exposure. Sees dentist regularly.  Falls:   Memory: Felt to be good  , no concern from her or her family.  Depression: No anhedonia unusual crying or depressive symptoms  Nutrition: Eats well balanced diet; adequate calcium and vitamin D. No swallowing chewing problems.  Injury: no major injuries in the last six months.  Other healthcare providers:  Reviewed todaPreventive parameters:   Reviewed   ADLS:   There are no problems or need for assistance  driving, feeding, obtaining food, dressing, toileting and bathing, managing money using phone. She is independent.    ROS:  GEN/ HEENT: No fever, significant weight changes sweats headaches vision problems hearing changes, CV/ PULM; No chest pain shortness of breath cough, syncope,edema  change in exercise tolerance. GI /GU: No adominal pain, vomiting, change in bowel habits. No blood in the stool. No significant GU symptoms. SKIN/HEME: ,no acute skin rashes suspicious lesions or bleeding. No lymphadenopathy,  nodules, masses.  NEURO/ PSYCH:  No neurologic signs such as weakness numbness. No depression anxiety. IMM/ Allergy: No unusual infections.  Allergy .   REST of 12 system review negative except as per HPI   Past Medical History:  Diagnosis Date  . 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 Arthritis    left knee  . Diplopia 2002   eval neg  . Hyperlipidemia    HDL over 100  . Hypothyroidism   . Kidney stones    in past  . Leukopenia    with neg hemevaluation bm bx get wbc diff q 6 months  . UTI (lower urinary tract infection)     Family History  Problem Relation Age of Onset  . Other Father        major renal failure; SCCA in ear  . Osteoarthritis Father   . Heart disease Father   . Heart disease Mother        32  . Autoimmune disease Sister   . Autoimmune disease Brother   . Stroke Maternal Grandmother   . Thyroid disease Other   . Stroke Other   . Osteoporosis Other     Social History   Socioeconomic History  . Marital status: Married    Spouse name: Not on file  . Number of children: Not on file  . Years of education: Not on file  . Highest education  level: Not on file  Occupational History  . Not on file  Tobacco Use  . Smoking status: Never Smoker  . Smokeless tobacco: Never Used  Vaping Use  . Vaping Use: Never used  Substance and Sexual Activity  . Alcohol use: Yes    Alcohol/week: 2.0 - 3.0 standard drinks    Types: 2 - 3 Glasses of wine per week    Comment: 2 to 3 glasses a week   . Drug use: No  . Sexual activity: Not on file  Other Topics Concern  . Not on file  Social History Narrative   Married   Retired  2008   Regular exercise-yes   [place] of 2   No pets   Social etoh   G2P2      [name] CABG 2012   Travel grandchildren    Social Determinants of Health   Financial Resource Strain:   . Difficulty of Paying Living Expenses: Not on file  Food Insecurity:   . Worried About Paediatric nurse in the Last Year: Not on file  . Ran Out of Food in the Last Year: Not on file  Transportation Needs:   . Lack of Transportation (Medical): Not on file  . Lack of Transportation (Non-Medical): Not on file  Physical Activity:   . Days of Exercise per Week: Not on file  . Minutes of Exercise per Session: Not on file  Stress:   . Feeling of Stress : Not on file  Social Connections:   . Frequency of Communication with Friends and Family: Not on file  . Frequency of Social Gatherings with Friends and Family: Not on file  . Attends Religious Services: Not on file  . Active Member of Clubs or Organizations: Not on file  . Attends Archivist Meetings: Not on file  . Marital Status: Not on file    Outpatient Encounter Medications as of 07/25/2020  Medication Sig  . cholecalciferol (VITAMIN D3) 25 MCG (1000 UNIT) tablet Take 2,000 Units by mouth daily.   Marland Kitchen levothyroxine (SYNTHROID) 88 MCG tablet TAKE 1 TABLET BY MOUTH EVERY DAY  . rosuvastatin (CRESTOR) 5 MG tablet Take 1 tablet (5 mg total) by mouth daily.   No facility-administered encounter medications on file as of 07/25/2020.    EXAM:  BP (!) 142/80 (BP Location: Left Arm, Patient Position: Sitting, Cuff Size: Normal)   Pulse (!) 58   Temp 97.8 F (36.6 C) (Oral)   Ht 5\' 6"  (1.676 m)   Wt 127 lb (57.6 kg)   SpO2 97%   BMI 20.50 kg/m   Body mass index is 20.5 kg/m.  Physical Exam: Vital signs reviewed HCW:CBJS is a well-developed well-nourished alert cooperative   who appears stated age in no acute distress.  HEENT: normocephalic atraumatic , Eyes: PERRL EOM's full, conjunctiva clear, Nares: paten,t no deformity discharge or tenderness.,TMs with normal landmarks. Mouth: masked  NECK: supple without masses, thyromegaly or bruits. CHEST/PULM:  Clear to auscultation and percussion breath sounds equal no wheeze , rales or rhonchi. No chest wall deformities or tenderness. CV: PMI is nondisplaced, S1 S2 no  gallops, murmurs, rubs. Peripheral pulses are full without delay.No JVD .  Breast: normal by inspection . No dimpling, discharge, masses, tenderness or discharge . ABDOMEN: Bowel sounds normal nontender  No guard or rebound, no hepato splenomegal no CVA tenderness.   Extremtities:  No clubbing cyanosis or edema, no acute joint swelling or redness no focal atrophy NEURO:  Oriented x3, cranial nerves 3-12 appear to be intact, no obvious focal weakness,gait within normal limits no abnormal reflexes or asymmetrical SKIN: No acute rashes normal turgor, color, no bruising or petechiae. PSYCH: Oriented, good eye contact, no obvious depression anxiety, cognition and judgment appear normal. LN: no cervical axillary inguinal adenopathy No noted deficits in memory, attention, and speech.   Lab Results  Component Value Date   WBC 2.4 (L) 06/22/2020   HGB 13.3 06/22/2020   HCT 39.9 06/22/2020   PLT 272 06/22/2020   GLUCOSE 83 06/22/2020   CHOL 278 (H) 06/22/2020   TRIG 63 06/22/2020   HDL 101 06/22/2020   LDLDIRECT 149.6 02/02/2013   LDLCALC 161 (H) 06/22/2020   ALT 20 06/22/2020   AST 23 06/22/2020   NA 140 06/22/2020   K 4.8 06/22/2020   CL 103 06/22/2020   CREATININE 0.64 06/22/2020   BUN 18 06/22/2020   CO2 32 06/22/2020   TSH 1.68 06/22/2020   INR 0.91 11/10/2009   HGBA1C 5.6 06/02/2018    ASSESSMENT AND PLAN:  Discussed the following assessment and plan:  Visit for preventive health examination  Medication management - Plan: Lipid panel, CBC with Differential/Platelet, Vitamin B12  Essential hypertension  Neutropenia, unspecified type ([place]) - Plan: Lipid panel, CBC with Differential/Platelet, Vitamin B12  Hypothyroidism, unspecified type  Hyperlipidemia with target LDL less than 100 - Plan: Lipid panel, CBC with Differential/Platelet, Vitamin B12 Plan  Monitor wbc  Lipid b12 in about 4 mos  Low bone mass  But not op -1.9  8 19    Vit d follow  For now off meds for bp   Risk may be more than benefit  At this time   Cards zio patch pending ( no sx when on)? Patient Care Team: Maisha Bogen, Standley Brooking, MD as PCP - Loni Beckwith, MD (Orthopedic Surgery) Unice Bailey, MD (Internal Medicine) Pyrtle, Lajuan Lines, MD as Consulting Physician (Gastroenterology) Pyrtle, Lajuan Lines, MD as Consulting Physician (Gastroenterology) Marica Otter, OD (Optometry) Freada Bergeron, MD as Consulting Physician (Cardiology)  Patient Instructions  Continue to check BP readings .   Stay off the   Lisiniopril.  For now could have had med effect in addition to orthostasis   There are other medications Health Maintenance, Female Adopting a healthy lifestyle and getting preventive care are important in promoting health and wellness. Ask your health care provider about:  The right schedule for you to have regular tests and exams.  Things you can do on your own to prevent diseases and keep yourself healthy. What should I know about diet, weight, and exercise? Eat a healthy diet   Eat a diet that includes plenty of vegetables, fruits, low-fat dairy products, and lean protein.  Do not eat a lot of foods that are high in solid fats, added sugars, or sodium. Maintain a healthy weight Body mass index (BMI) is used to identify weight problems. It estimates body fat based on height and weight. Your health care provider can help determine your BMI and help you achieve or maintain a healthy weight. Get regular exercise Get regular exercise. This is one of the most important things you can do for your health. Most adults should:  Exercise for at least 150 minutes each week. The exercise should increase your heart rate and make you sweat (moderate-intensity exercise).  Do strengthening exercises at least twice a week. This is in addition to the moderate-intensity exercise.  Spend less time sitting. Even light physical activity can  be beneficial. Watch cholesterol and blood  lipids Have your blood tested for lipids and cholesterol at 77 years of age, then have this test every 5 years. Have your cholesterol levels checked more often if:  Your lipid or cholesterol levels are high.  You are older than 77 years of age.  You are at high risk for heart disease. What should I know about cancer screening? Depending on your health history and family history, you may need to have cancer screening at various ages. This may include screening for:  Breast cancer.  Cervical cancer.  Colorectal cancer.  Skin cancer.  Lung cancer. What should I know about heart disease, diabetes, and high blood pressure? Blood pressure and heart disease  High blood pressure causes heart disease and increases the risk of stroke. This is more likely to develop in people who have high blood pressure readings, are of African descent, or are overweight.  Have your blood pressure checked: ? Every 3-5 years if you are 34-10 years of age. ? Every year if you are 75 years old or older. Diabetes Have regular diabetes screenings. This checks your fasting blood sugar level. Have the screening done:  Once every three years after age 40 if you are at a normal weight and have a low risk for diabetes.  More often and at a younger age if you are overweight or have a high risk for diabetes. What should I know about preventing infection? Hepatitis B If you have a higher risk for hepatitis B, you should be screened for this virus. Talk with your health care provider to find out if you are at risk for hepatitis B infection. Hepatitis C Testing is recommended for:  Everyone born from [birth year] through 1963.  Anyone with known risk factors for hepatitis C. Sexually transmitted infections (STIs)  Get screened for STIs, including gonorrhea and chlamydia, if: ? You are sexually active and are younger than 77 years of age. ? You are older than 77 years of age and your health care provider tells you that  you are at risk for this type of infection. ? Your sexual activity has changed since you were last screened, and you are at increased risk for chlamydia or gonorrhea. Ask your health care provider if you are at risk.  Ask your health care provider about whether you are at high risk for HIV. Your health care provider may recommend a prescription medicine to help prevent HIV infection. If you choose to take medicine to prevent HIV, you should first get tested for HIV. You should then be tested every 3 months for as long as you are taking the medicine. Pregnancy  If you are about to stop having your period (premenopausal) and you may become pregnant, seek counseling before you get pregnant.  Take 400 to 800 micrograms (mcg) of folic acid every day if you become pregnant.  Ask for birth control (contraception) if you want to prevent pregnancy. Osteoporosis and menopause Osteoporosis is a disease in which the bones lose minerals and strength with aging. This can result in bone fractures. If you are 76 years old or older, or if you are at risk for osteoporosis and fractures, ask your health care provider if you should:  Be screened for bone loss.  Take a calcium or vitamin D supplement to lower your risk of fractures.  Be given hormone replacement therapy (HRT) to treat symptoms of menopause. Follow these instructions at home: Lifestyle  Do not use any products that  contain nicotine or tobacco, such as cigarettes, e-cigarettes, and chewing tobacco. If you need help quitting, ask your health care provider.  Do not use street drugs.  Do not share needles.  Ask your health care provider for help if you need support or information about quitting drugs. Alcohol use  Do not drink alcohol if: ? Your health care provider tells you not to drink. ? You are pregnant, may be pregnant, or are planning to become pregnant.  If you drink alcohol: ? Limit how much you use to 0-1 drink a day. ? Limit  intake if you are breastfeeding.  Be aware of how much alcohol is in your drink. In the [place] one drink equals one 12 oz bottle of beer (355 mL), one 5 oz glass of wine (148 mL), or one 1 oz glass of hard liquor (44 mL). General instructions  Schedule regular health, dental, and eye exams.  Stay current with your vaccines.  Tell your health care provider if: ? You often feel depressed. ? You have ever been abused or do not feel safe at home. Summary  Adopting a healthy lifestyle and getting preventive care are important in promoting health and wellness.  Follow your health care provider's instructions about healthy diet, exercising, and getting tested or screened for diseases.  Follow your health care provider's instructions on monitoring your cholesterol and blood pressure. This information is not intended to replace advice given to you by your health care provider. Make sure you discuss any questions you have with your health care provider. Document Revised: 09/09/2018 Document Reviewed: 09/09/2018 Elsevier Patient Education  [employer].  if needed at all.   Continue  Weight bearing exercise  Get dexa next year .   Plan lab in about 4 months fasting   Lipid panel cbcdiff and vit b12 level  If all ok then yearly visit.        Standley Brooking. Shalene Gallen M.D.

## 2020-07-25 ENCOUNTER — Ambulatory Visit (INDEPENDENT_AMBULATORY_CARE_PROVIDER_SITE_OTHER): Payer: PPO | Admitting: Internal Medicine

## 2020-07-25 ENCOUNTER — Encounter: Payer: Self-pay | Admitting: Internal Medicine

## 2020-07-25 ENCOUNTER — Other Ambulatory Visit: Payer: Self-pay

## 2020-07-25 VITALS — BP 142/80 | HR 58 | Temp 97.8°F | Ht 66.0 in | Wt 127.0 lb

## 2020-07-25 DIAGNOSIS — E039 Hypothyroidism, unspecified: Secondary | ICD-10-CM | POA: Diagnosis not present

## 2020-07-25 DIAGNOSIS — I10 Essential (primary) hypertension: Secondary | ICD-10-CM

## 2020-07-25 DIAGNOSIS — Z0000 Encounter for general adult medical examination without abnormal findings: Secondary | ICD-10-CM

## 2020-07-25 DIAGNOSIS — Z79899 Other long term (current) drug therapy: Secondary | ICD-10-CM

## 2020-07-25 DIAGNOSIS — E785 Hyperlipidemia, unspecified: Secondary | ICD-10-CM | POA: Diagnosis not present

## 2020-07-25 DIAGNOSIS — D709 Neutropenia, unspecified: Secondary | ICD-10-CM

## 2020-07-25 NOTE — Patient Instructions (Addendum)
Continue to check BP readings .   Stay off the   Lisiniopril.  For now could have had med effect in addition to orthostasis   There are other medications Health Maintenance, Female Adopting a healthy lifestyle and getting preventive care are important in promoting health and wellness. Ask your health care provider about:  The right schedule for you to have regular tests and exams.  Things you can do on your own to prevent diseases and keep yourself healthy. What should I know about diet, weight, and exercise? Eat a healthy diet   Eat a diet that includes plenty of vegetables, fruits, low-fat dairy products, and lean protein.  Do not eat a lot of foods that are high in solid fats, added sugars, or sodium. Maintain a healthy weight Body mass index (BMI) is used to identify weight problems. It estimates body fat based on height and weight. Your health care provider can help determine your BMI and help you achieve or maintain a healthy weight. Get regular exercise Get regular exercise. This is one of the most important things you can do for your health. Most adults should:  Exercise for at least 150 minutes each week. The exercise should increase your heart rate and make you sweat (moderate-intensity exercise).  Do strengthening exercises at least twice a week. This is in addition to the moderate-intensity exercise.  Spend less time sitting. Even light physical activity can be beneficial. Watch cholesterol and blood lipids Have your blood tested for lipids and cholesterol at 77 years of age, then have this test every 5 years. Have your cholesterol levels checked more often if:  Your lipid or cholesterol levels are high.  You are older than 77 years of age.  You are at high risk for heart disease. What should I know about cancer screening? Depending on your health history and family history, you may need to have cancer screening at various ages. This may include screening  for:  Breast cancer.  Cervical cancer.  Colorectal cancer.  Skin cancer.  Lung cancer. What should I know about heart disease, diabetes, and high blood pressure? Blood pressure and heart disease  High blood pressure causes heart disease and increases the risk of stroke. This is more likely to develop in people who have high blood pressure readings, are of African descent, or are overweight.  Have your blood pressure checked: ? Every 3-5 years if you are 20-52 years of age. ? Every year if you are 16 years old or older. Diabetes Have regular diabetes screenings. This checks your fasting blood sugar level. Have the screening done:  Once every three years after age 61 if you are at a normal weight and have a low risk for diabetes.  More often and at a younger age if you are overweight or have a high risk for diabetes. What should I know about preventing infection? Hepatitis B If you have a higher risk for hepatitis B, you should be screened for this virus. Talk with your health care provider to find out if you are at risk for hepatitis B infection. Hepatitis C Testing is recommended for:  Everyone born from [birth year] through 1963.  Anyone with known risk factors for hepatitis C. Sexually transmitted infections (STIs)  Get screened for STIs, including gonorrhea and chlamydia, if: ? You are sexually active and are younger than 77 years of age. ? You are older than 77 years of age and your health care provider tells you that you are at risk  for this type of infection. ? Your sexual activity has changed since you were last screened, and you are at increased risk for chlamydia or gonorrhea. Ask your health care provider if you are at risk.  Ask your health care provider about whether you are at high risk for HIV. Your health care provider may recommend a prescription medicine to help prevent HIV infection. If you choose to take medicine to prevent HIV, you should first get tested for HIV.  You should then be tested every 3 months for as long as you are taking the medicine. Pregnancy  If you are about to stop having your period (premenopausal) and you may become pregnant, seek counseling before you get pregnant.  Take 400 to 800 micrograms (mcg) of folic acid every day if you become pregnant.  Ask for birth control (contraception) if you want to prevent pregnancy. Osteoporosis and menopause Osteoporosis is a disease in which the bones lose minerals and strength with aging. This can result in bone fractures. If you are 51 years old or older, or if you are at risk for osteoporosis and fractures, ask your health care provider if you should:  Be screened for bone loss.  Take a calcium or vitamin D supplement to lower your risk of fractures.  Be given hormone replacement therapy (HRT) to treat symptoms of menopause. Follow these instructions at home: Lifestyle  Do not use any products that contain nicotine or tobacco, such as cigarettes, e-cigarettes, and chewing tobacco. If you need help quitting, ask your health care provider.  Do not use street drugs.  Do not share needles.  Ask your health care provider for help if you need support or information about quitting drugs. Alcohol use  Do not drink alcohol if: ? Your health care provider tells you not to drink. ? You are pregnant, may be pregnant, or are planning to become pregnant.  If you drink alcohol: ? Limit how much you use to 0-1 drink a day. ? Limit intake if you are breastfeeding.  Be aware of how much alcohol is in your drink. In the [place] one drink equals one 12 oz bottle of beer (355 mL), one 5 oz glass of wine (148 mL), or one 1 oz glass of hard liquor (44 mL). General instructions  Schedule regular health, dental, and eye exams.  Stay current with your vaccines.  Tell your health care provider if: ? You often feel depressed. ? You have ever been abused or do not feel safe at  home. Summary  Adopting a healthy lifestyle and getting preventive care are important in promoting health and wellness.  Follow your health care provider's instructions about healthy diet, exercising, and getting tested or screened for diseases.  Follow your health care provider's instructions on monitoring your cholesterol and blood pressure. This information is not intended to replace advice given to you by your health care provider. Make sure you discuss any questions you have with your health care provider. Document Revised: 09/09/2018 Document Reviewed: 09/09/2018 Elsevier Patient Education  [employer].  if needed at all.   Continue  Weight bearing exercise  Get dexa next year .   Plan lab in about 4 months fasting   Lipid panel cbcdiff and vit b12 level  If all ok then yearly visit.

## 2020-08-01 ENCOUNTER — Telehealth: Payer: Self-pay | Admitting: Cardiology

## 2020-08-01 DIAGNOSIS — R002 Palpitations: Secondary | ICD-10-CM | POA: Diagnosis not present

## 2020-08-01 MED ORDER — APIXABAN 5 MG PO TABS: 5.0000 mg | ORAL_TABLET | Freq: Two times a day (BID) | ORAL | 4 refills | Status: DC

## 2020-08-01 MED ORDER — METOPROLOL SUCCINATE ER 25 MG PO TB24: 12.5000 mg | ORAL_TABLET | Freq: Every day | ORAL | 3 refills | Status: DC

## 2020-08-01 NOTE — Telephone Encounter (Signed)
Crystal Odom from [facility] is calling to inform Dr. Johney Frame that the pts zio monitor results are complete and available for her to review.  Per Crystal Odom, the pts final summary showed that she wore the monitor for 12 days, and on pg 7-9, it showed the pt to have afib for 1 min with highest rate of 186 bpm.  Per [name], her summary also showed that the pt had several episodes of SVT.  Will send Dr. Johney Frame this message to inform her of pts monitor summary, and this is currently available for her to review in studies, and send back to triage to further follow-up with the pt.  Triage to follow-up with the pt accordingly thereafter, once reviewed by MD.

## 2020-08-01 NOTE — Telephone Encounter (Signed)
Called and spoke to the patient about her monitor results that demonstrated several runs of Afib as well as several episodes of SVT. CHADs-vasc 4-5 (age, gender, HTN, +/- vascular disease with mild AS). Patient is amenable to starting apixaban at this time and has no contraindications. Will also start low dose metoprolol to help with palpitations and suppress episodes. Patient requested that we send scripts to [facility] on [address].  Plan: -Start apixaban 5mg  BID -Start metoprolol 12.5mg  XL qHS -Follow-up with me in 71months -Please send scripts to [facility] on [address] per patient request  Gwyndolyn Kaufman, MD

## 2020-08-01 NOTE — Addendum Note (Signed)
Addended by: Nuala Alpha on: 08/01/2020 12:52 PM   Modules accepted: Orders

## 2020-08-01 NOTE — Telephone Encounter (Signed)
Spoke with the pt and informed her that new Rx for Eliquis and Toprol XL were sent into her confirmed pharmacy of choice, [facility].  Also informed the pt that I will leave samples of her Eliquis 5 mg po bid, with a free 30 day copay card, at the front desk for her to come pick up today. Scheduled the pt to come back in 3 months to see Dr. Johney Frame for 10/31/20 at 0800.  She is aware to arrive 15 mins prior to this appt. Pt verbalized understanding and agrees with this plan. Pt was more than gracious for all the assistance provided.

## 2020-08-01 NOTE — Telephone Encounter (Signed)
    Brittney from [facility] calling to give abnormal zio patch result

## 2020-08-02 ENCOUNTER — Other Ambulatory Visit: Payer: Self-pay | Admitting: Internal Medicine

## 2020-08-09 ENCOUNTER — Other Ambulatory Visit: Payer: Self-pay | Admitting: Internal Medicine

## 2020-09-06 ENCOUNTER — Other Ambulatory Visit: Payer: Self-pay | Admitting: Internal Medicine

## 2020-09-06 DIAGNOSIS — Z1231 Encounter for screening mammogram for malignant neoplasm of breast: Secondary | ICD-10-CM

## 2020-09-08 ENCOUNTER — Other Ambulatory Visit: Payer: Self-pay

## 2020-09-08 ENCOUNTER — Ambulatory Visit
Admission: RE | Admit: 2020-09-08 | Discharge: 2020-09-08 | Disposition: A | Discharge status: Home / Self Care | Payer: PPO | Source: Ambulatory Visit

## 2020-09-08 DIAGNOSIS — Z1231 Encounter for screening mammogram for malignant neoplasm of breast: Secondary | ICD-10-CM | POA: Diagnosis not present

## 2020-09-15 ENCOUNTER — Telehealth: Payer: Self-pay | Admitting: Internal Medicine

## 2020-09-15 NOTE — Progress Notes (Signed)
  Chronic Care Management   Note  09/15/2020 Name: Serrita Lueth MRN: [id number] DOB: 1941/02/25  Grenda Lora is a 79 y.o. year old female who is a primary care patient of Panosh, Standley Brooking, MD. I reached out to [name] by phone today in response to a referral sent by Ms. Benjamine Mola [place]'s PCP, Panosh, Standley Brooking, MD.   Ms. Veach was given information about Chronic Care Management services today including:  1. CCM service includes personalized support from designated clinical staff supervised by her physician, including individualized plan of care and coordination with other care providers 2. 24/7 contact phone numbers for assistance for urgent and routine care needs. 3. Service will only be billed when office clinical staff spend 20 minutes or more in a month to coordinate care. 4. Only one practitioner may furnish and bill the service in a calendar month. 5. The patient may stop CCM services at any time (effective at the end of the month) by phone call to the office staff.   Patient agreed to services and verbal consent obtained.   Follow up plan:   Carley Perdue [employer] Scheduler

## 2020-10-25 ENCOUNTER — Other Ambulatory Visit: Payer: Self-pay | Admitting: Internal Medicine

## 2020-10-28 NOTE — Progress Notes (Signed)
Cardiology Office Note:    Date:  10/31/2020   ID:  Crystal Odom, DOB 1941/01/08, MRN [id number]  PCP:  Burnis Medin, MD  [facility] Cardiologist:  Freada Bergeron, MD  [facility] Electrophysiologist:  None   Referring MD: Burnis Medin, MD    History of Present Illness:    Crystal Odom is a 80 y.o. female with a hx of HTN, mild AS and HLD who was recently diagnosed with Afib on cardiac monitor after presenting with palpitations who now presents to clinic for follow-up.  During our last visit on 07/07/20, the patient was having episodes of palpitations. We placed a cardiac monitor which showed episodes of Afib. We started her on apixaban at the time.  Today, the patient states that she has been doing overall well. She is concerned that her blood pressure and heart rate has been all over the place. Mainly her heart rates have been in the 50-70s but today it was in the 100s. The higher heart rates correlate with when she has mild palpitations and she think she is out of rhythm. Most episodes of her Afib last about 64mn before resolving. Fortunately, she has minimal symptoms when she is out of rhythm with only the slight sensation of palpitations. No lightheadedness, chest pain, shortness of breath.  Tolerating apixaban without any bleeding issues. Blood pressures mainly 100s-110s. Currently taking metop 12.547mXL daily.  Cardiac CT calcium score was 0 in 2017. She remains active and walks daily for exercise. No limitations.   Past Medical History:  Diagnosis Date  . 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 Arthritis    left knee  . Diplopia 2002   eval neg  . Hyperlipidemia    HDL over 100  . Hypothyroidism   . Kidney stones    in past  . Leukopenia    with neg hemevaluation bm bx get wbc diff q 6 months  . UTI (lower urinary tract infection)     Past Surgical History:  Procedure Laterality Date  .  BONE MARROW BIOPSY     low WBC  . kidney stone removal    . left arthroscopic  2005   left knee  . LIVER BIOPSY     reported normal  . TONSILLECTOMY    . TUBAL LIGATION      Current Medications: Current Meds  Medication Sig  . apixaban (ELIQUIS) 5 MG TABS tablet Take 1 tablet (5 mg total) by mouth 2 (two) times daily.  . cholecalciferol (VITAMIN D3) 25 MCG (1000 UNIT) tablet Take 2,000 Units by mouth daily.   . Marland Kitchensomeprazole (NEXIUM) 40 MG capsule Take one tablet by mouth daily  . levothyroxine (SYNTHROID) 88 MCG tablet TAKE 1 TABLET BY MOUTH EVERY DAY  . metoprolol succinate (TOPROL XL) 25 MG 24 hr tablet Take 0.5 tablets (12.5 mg total) by mouth 2 (two) times daily.  . [DISCONTINUED] metoprolol succinate (TOPROL-XL) 25 MG 24 hr tablet Take 0.5 tablets (12.5 mg total) by mouth at bedtime.     Allergies:   Penicillins   Social History   Socioeconomic History  . Marital status: Married    Spouse name: Not on file  . Number of children: Not on file  . Years of education: Not on file  . Highest education level: Not on file  Occupational History  . Not on file  Tobacco Use  . Smoking status: Never Smoker  . Smokeless tobacco:  Never Used  Vaping Use  . Vaping Use: Never used  Substance and Sexual Activity  . Alcohol use: Yes    Alcohol/week: 2.0 - 3.0 standard drinks    Types: 2 - 3 Glasses of wine per week    Comment: 2 to 3 glasses a week   . Drug use: No  . Sexual activity: Not on file  Other Topics Concern  . Not on file  Social History Narrative   Married   Retired  2008   Regular exercise-yes   HH of 2   No pets   Social etoh   G2P2      Husband hac CABG 2012   Travel grandchildren    Social Determinants of Health   Financial Resource Strain: Not on file  Food Insecurity: Not on file  Transportation Needs: Not on file  Physical Activity: Not on file  Stress: Not on file  Social Connections: Not on file     Family History: The patient's family  history includes Autoimmune disease in her brother and sister; Heart disease in her father and mother; Osteoarthritis in her father; Osteoporosis in an other family member; Other in her father; Stroke in her maternal grandmother and another family member; Thyroid disease in an other family member.  ROS:   Please see the history of present illness.    Review of Systems  Constitutional: Negative for chills and fever.  HENT: Negative for nosebleeds.   Eyes: Negative for blurred vision.  Respiratory: Negative for shortness of breath.   Cardiovascular: Positive for palpitations. Negative for chest pain, orthopnea, claudication, leg swelling and PND.  Gastrointestinal: Negative for melena, nausea and vomiting.  Genitourinary: Negative for dysuria and hematuria.  Musculoskeletal: Negative for falls.  Neurological: Negative for dizziness and loss of consciousness.  Endo/Heme/Allergies: Negative for polydipsia.  Psychiatric/Behavioral: Negative for substance abuse. The patient is not nervous/anxious.     EKGs/Labs/Other Studies Reviewed:    The following studies were reviewed today: 08/01/20 Cardiac Monitor:  Predominant rhythm is sinus with average HR 62bpm. HR range 38bpm to 220bpm.  There were 59 episodes of SVT with fastest 179bpm lasting 7 beats. Longest episode lasted 15.6 secs with average HR 101bpm.  There were several runs of Afib (<1% burden) with longest episode lasting 23mn and 10sec with average HR 178. Patient symptomatic at that time.  Isolated PACs and PVCs were rare (<1% burden). No NSVT, VT or significant pauses.  TTE 06/11/19: IMPRESSIONS  1. The left ventricle has hyperdynamic systolic function, with an  ejection fraction of >65%. The cavity size was normal. There is mild  eccentric left ventricular hypertrophy. Left ventricular diastolic Doppler  parameters are indeterminate. The E/e' is  9. No evidence of left ventricular regional wall motion abnormalities.  2.  The right ventricle has normal systolic function. The cavity was  normal. There is no increase in right ventricular wall thickness. Right  ventricular systolic pressure is normal.  3. There is systolic bowing of the mitral leaflets, without true  prolapse.  4. The aortic valve is tricuspid. Mild thickening of the aortic valve.  Sclerosis without any evidence of stenosis of the aortic valve. Mild  stenosis of the aortic valve.  5. Focal calcification of noncoronary cusp.  6. The aorta is normal unless otherwise noted.  7. The aortic root, ascending aorta and aortic arch are normal in size  and structure.   EKG:  EKG is  ordered today.  The ekg ordered today demonstrates Afib with HR  116, mild STD in V4-V6, II and aVF  Recent Labs: 06/22/2020: ALT 20; BUN 18; Creat 0.64; Hemoglobin 13.3; Platelets 272; Potassium 4.8; Sodium 140; TSH 1.68  Recent Lipid Panel    Component Value Date/Time   CHOL 278 (H) 06/22/2020 0739   TRIG 63 06/22/2020 0739   HDL 101 06/22/2020 0739   CHOLHDL 2.8 06/22/2020 0739   VLDL 12.0 06/04/2019 0906   LDLCALC 161 (H) 06/22/2020 0739   LDLDIRECT 149.6 02/02/2013 1003     Risk Assessment/Calculations:    CHA2DS2-VASc Score = 4  {This indicates a 4.8% annual risk of stroke. The patient's score is based upon: CHF History: No HTN History: Yes Diabetes History: No Stroke History: No Vascular Disease History: No Age Score: 2 Gender Score: 1    Physical Exam:    VS:  BP 130/90   Pulse (!) 116   Ht 5' 4.5" (1.638 m)   Wt 129 lb 6.4 oz (58.7 kg)   SpO2 99%   BMI 21.87 kg/m     Wt Readings from Last 3 Encounters:  10/31/20 129 lb 6.4 oz (58.7 kg)  07/25/20 127 lb (57.6 kg)  07/07/20 128 lb 9.6 oz (58.3 kg)     GEN:  Well nourished, well developed in no acute distress HEENT: Normal NECK: No JVD; No carotid bruits CARDIAC: Irregularly irregular, no murmurs, rubs, gallops RESPIRATORY:  Clear to auscultation without rales, wheezing or  rhonchi  ABDOMEN: Soft, non-tender, non-distended MUSCULOSKELETAL:  No edema; No deformity  SKIN: Warm and dry NEUROLOGIC:  Alert and oriented x 3 PSYCHIATRIC:  Normal affect   ASSESSMENT:    1. Paroxysmal atrial fibrillation (HCC)   2. Lightheadedness   3. Elevated LDL cholesterol level   4. Primary hypertension   5. Other neutropenia ([place])   6. Medication management    PLAN:    In order of problems listed above:  #Newly Diagnosed Paroxysmal Afib Zio patch placed for palpitations revealed runs of Afib. Started on [facility] with apixaban and metop for rate control. Continues to have runs of Afib at home with mild symptoms of palpitations. No lightheadedness, dizziness, chest pain or SOB. Currently in Afib today with HR 116. -Continue apixaban 2m BID -Increase metoprolol to 12.5106mBID (only take if SBP>100) -Will start nexium daily for gastric protection -If maintaining HR >120 for >2 hours, can take an additional 12.61m51mf metop -Discussed that if she becomes symptomatic with worsening SOB, lightheadedness, dizziness, chest pain or hypotension--needs to go to ER to be evaluated -TTE in 2020 with normal LVEF, mild AS, no other significant valvular disease  #Orthostasis: #Lightheadedness: Resolved with stopping the lisinopril.  -TTE 06/2019 with normal BiV function, mild LVH, mild AS -Stopped lisinopril -Counseled about adequate fluid and salt intake -Compression therapy with stockings and/or abdominal binder -Counseled about countermaneuvers including laying down with her legs up if she feels like she is going to pass out   #Elevated LDL: LDL 161, HDL 101. ASCVD risk score 15.2%. Coronary calcium score in 2017 0.  -Continue Crestor 61mg57m daily -Check lipids today  #HTN: Well controlled. -Continue metop for rate control as above -Off lisinopril due to orthostasis symptoms   Medication Adjustments/Labs and Tests Ordered: Current medicines are reviewed at length with the  patient today.  Concerns regarding medicines are outlined above.  Orders Placed This Encounter  Procedures  . Lipid Profile  . Vitamin B12  . CBC w/Diff  . EKG 12-Lead   Meds ordered this encounter  Medications  .  esomeprazole (NEXIUM) 40 MG capsule    Sig: Take one tablet by mouth daily    Dispense:  90 capsule    Refill:  3  . metoprolol succinate (TOPROL XL) 25 MG 24 hr tablet    Sig: Take 0.5 tablets (12.5 mg total) by mouth 2 (two) times daily.    Dispense:  90 tablet    Refill:  3    Dose increase.  Patient will call when she needs filled    Patient Instructions  Medication Instructions:  Your physician has recommended you make the following change in your medication: Start Nexium 40 mg by mouth daily Increase Toprol to 12.5 mg by mouth twice daily *If you need a refill on your cardiac medications before your next appointment, please call your pharmacy*   Lab Work: Lab work to be done today--CBC, Vitamin B12, Lipid profile If you have labs (blood work) drawn today and your tests are completely normal, you will receive your results only by: Marland Kitchen MyChart Message (if you have MyChart) OR . A paper copy in the mail If you have any lab test that is abnormal or we need to change your treatment, we will call you to review the results.   Testing/Procedures: none   Follow-Up: At [facility], you and your health needs are our priority.  As part of our continuing mission to provide you with exceptional heart care, we have created designated Provider Care Teams.  These Care Teams include your primary Cardiologist (physician) and Advanced Practice Providers (APPs -  Physician Assistants and Nurse Practitioners) who all work together to provide you with the care you need, when you need it.  We recommend signing up for the patient portal called "MyChart".  Sign up information is provided on this After Visit Summary.  MyChart is used to connect with patients for Virtual Visits  (Telemedicine).  Patients are able to view lab/test results, encounter notes, upcoming appointments, etc.  Non-urgent messages can be sent to your provider as well.   To learn more about what you can do with MyChart, go to [email or web address].    Your next appointment:   6 month(s)  The format for your next appointment:   In Person  Provider:   You may see Freada Bergeron, MD or one of the following Advanced Practice Providers on your designated Care Team:    Richardson Dopp, PA-C  Robbie Lis, [place]    Other Instructions     Signed, Freada Bergeron, MD  10/31/2020 10:27 AM    [place]

## 2020-10-31 ENCOUNTER — Other Ambulatory Visit: Payer: Self-pay

## 2020-10-31 ENCOUNTER — Ambulatory Visit: Payer: PPO | Admitting: Cardiology

## 2020-10-31 ENCOUNTER — Encounter: Payer: Self-pay | Admitting: Cardiology

## 2020-10-31 VITALS — BP 130/90 | HR 116 | Ht 64.5 in | Wt 129.4 lb

## 2020-10-31 DIAGNOSIS — I10 Essential (primary) hypertension: Secondary | ICD-10-CM

## 2020-10-31 DIAGNOSIS — I480 Paroxysmal atrial fibrillation: Secondary | ICD-10-CM

## 2020-10-31 DIAGNOSIS — E7800 Pure hypercholesterolemia, unspecified: Secondary | ICD-10-CM

## 2020-10-31 DIAGNOSIS — R42 Dizziness and giddiness: Secondary | ICD-10-CM | POA: Diagnosis not present

## 2020-10-31 DIAGNOSIS — Z79899 Other long term (current) drug therapy: Secondary | ICD-10-CM | POA: Diagnosis not present

## 2020-10-31 DIAGNOSIS — D708 Other neutropenia: Secondary | ICD-10-CM | POA: Diagnosis not present

## 2020-10-31 LAB — CBC WITH DIFFERENTIAL/PLATELET
Basophils Absolute: 0.1 10*3/uL (ref 0.0–0.2)
Basos: 2 %
EOS (ABSOLUTE): 0.1 10*3/uL (ref 0.0–0.4)
Eos: 2 %
Hematocrit: 42 % (ref 34.0–46.6)
Hemoglobin: 14.2 g/dL (ref 11.1–15.9)
Immature Grans (Abs): 0 10*3/uL (ref 0.0–0.1)
Immature Granulocytes: 0 %
Lymphocytes Absolute: 1 10*3/uL (ref 0.7–3.1)
Lymphs: 30 %
MCH: 31 pg (ref 26.6–33.0)
MCHC: 33.8 g/dL (ref 31.5–35.7)
MCV: 92 fL (ref 79–97)
Monocytes Absolute: 0.4 10*3/uL (ref 0.1–0.9)
Monocytes: 11 %
Neutrophils Absolute: 1.9 10*3/uL (ref 1.4–7.0)
Neutrophils: 55 %
Platelets: 245 10*3/uL (ref 150–450)
RBC: 4.58 x10E6/uL (ref 3.77–5.28)
RDW: 12.2 % (ref 11.7–15.4)
WBC: 3.4 10*3/uL (ref 3.4–10.8)

## 2020-10-31 LAB — LIPID PANEL
Chol/HDL Ratio: 2 ratio (ref 0.0–4.4)
Cholesterol, Total: 239 mg/dL — ABNORMAL HIGH (ref 100–199)
HDL: 118 mg/dL (ref >39)
LDL Chol Calc (NIH): 111 mg/dL — ABNORMAL HIGH (ref 0–99)
Triglycerides: 61 mg/dL (ref 0–149)
VLDL Cholesterol Cal: 10 mg/dL (ref 5–40)

## 2020-10-31 LAB — VITAMIN B12: Vitamin B-12: 396 pg/mL (ref 232–1245)

## 2020-10-31 MED ORDER — METOPROLOL SUCCINATE ER 25 MG PO TB24: 12.5000 mg | ORAL_TABLET | Freq: Two times a day (BID) | ORAL | 3 refills | Status: DC

## 2020-10-31 MED ORDER — ESOMEPRAZOLE MAGNESIUM 40 MG PO CPDR: DELAYED_RELEASE_CAPSULE | ORAL | 3 refills | Status: DC

## 2020-10-31 NOTE — Patient Instructions (Signed)
Medication Instructions:  Your physician has recommended you make the following change in your medication: Start Nexium 40 mg by mouth daily Increase Toprol to 12.5 mg by mouth twice daily *If you need a refill on your cardiac medications before your next appointment, please call your pharmacy*   Lab Work: Lab work to be done today--CBC, Vitamin B12, Lipid profile If you have labs (blood work) drawn today and your tests are completely normal, you will receive your results only by: Marland Kitchen MyChart Message (if you have MyChart) OR . A paper copy in the mail If you have any lab test that is abnormal or we need to change your treatment, we will call you to review the results.   Testing/Procedures: none   Follow-Up: At [facility], you and your health needs are our priority.  As part of our continuing mission to provide you with exceptional heart care, we have created designated Provider Care Teams.  These Care Teams include your primary Cardiologist (physician) and Advanced Practice Providers (APPs -  Physician Assistants and Nurse Practitioners) who all work together to provide you with the care you need, when you need it.  We recommend signing up for the patient portal called "MyChart".  Sign up information is provided on this After Visit Summary.  MyChart is used to connect with patients for Virtual Visits (Telemedicine).  Patients are able to view lab/test results, encounter notes, upcoming appointments, etc.  Non-urgent messages can be sent to your provider as well.   To learn more about what you can do with MyChart, go to [email or web address].    Your next appointment:   6 month(s)  The format for your next appointment:   In Person  Provider:   You may see Freada Bergeron, MD or one of the following Advanced Practice Providers on your designated Care Team:    Richardson Dopp, PA-C  Robbie Lis, [place]    Other Instructions

## 2020-11-01 ENCOUNTER — Telehealth: Payer: Self-pay

## 2020-11-01 NOTE — Telephone Encounter (Signed)
-----   Message from Freada Bergeron, MD sent at 10/31/2020  7:12 PM EST ----- Her cholesterol shows that her LDL or bad cholesterol is a little elevated at 111 but her good cholesterol or HDL is excellent with a level of 118. We can keep her medications the same for now. She should continue her healthy diet and exercise.   Her blood counts are completely normal. We will follow-up her B12 level.

## 2020-11-01 NOTE — Telephone Encounter (Signed)
The patient has been notified of the result and verbalized understanding.  All questions (if any) were answered. Wilma Flavin, RN 11/01/2020 8:32 AM

## 2020-11-06 NOTE — Progress Notes (Signed)
Subjective:   Crystal Odom is a 80 y.o. female who presents for Medicare Annual (Subsequent) preventive examination.  Virtual Visit via Video Note  I connected with Crystal Odom by a video enabled telemedicine application and verified that I am speaking with the correct person using two identifiers.  Location: Patient: Home Provider: Office Persons participating in the virtual visit: patient, provider   I discussed the limitations of evaluation and management by telemedicine and the availability of in person appointments. The patient expressed understanding and agreed to proceed.     Crystal [place] Jove Beyl,LPN    Review of Systems    N/A  Cardiac Risk Factors include: advanced age (>17men, >16 women);dyslipidemia     Objective:    Today's Vitals   There is no height or weight on file to calculate BMI.  Advanced Directives 11/07/2020 04/22/2018 07/05/2016  Does Patient Have a Medical Advance Directive? Yes Yes Yes  Type of Paramedic of [place];Living will - -  Does patient want to make changes to medical advance directive? No - Patient declined - -  Copy of [place] in Chart? No - copy requested - -    Current Medications (verified) Outpatient Encounter Medications as of 11/07/2020  Medication Sig  . apixaban (ELIQUIS) 5 MG TABS tablet Take 1 tablet (5 mg total) by mouth 2 (two) times daily.  . cholecalciferol (VITAMIN D3) 25 MCG (1000 UNIT) tablet Take 2,000 Units by mouth daily.   Marland Kitchen esomeprazole (NEXIUM) 40 MG capsule Take one tablet by mouth daily  . levothyroxine (SYNTHROID) 88 MCG tablet TAKE 1 TABLET BY MOUTH EVERY DAY  . metoprolol succinate (TOPROL XL) 25 MG 24 hr tablet Take 0.5 tablets (12.5 mg total) by mouth 2 (two) times daily.  . rosuvastatin (CRESTOR) 5 MG tablet Take 1 tablet (5 mg total) by mouth daily.   No facility-administered encounter medications on file as of 11/07/2020.    Allergies  (verified) Penicillins   History: Past Medical History:  Diagnosis Date  . 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 Arthritis    left knee  . Diplopia 2002   eval neg  . Hyperlipidemia    HDL over 100  . Hypothyroidism   . Kidney stones    in past  . Leukopenia    with neg hemevaluation bm bx get wbc diff q 6 months  . UTI (lower urinary tract infection)    Past Surgical History:  Procedure Laterality Date  . BONE MARROW BIOPSY     low WBC  . CATARACT EXTRACTION, BILATERAL    . kidney stone removal    . left arthroscopic  2005   left knee  . LIVER BIOPSY     reported normal  . TONSILLECTOMY    . TUBAL LIGATION     Family History  Problem Relation Age of Onset  . Other Father        major renal failure; SCCA in ear  . Osteoarthritis Father   . Heart disease Father   . Heart disease Mother        47  . Autoimmune disease Sister   . Autoimmune disease Brother   . Stroke Maternal Grandmother   . Thyroid disease Other   . Stroke Other   . Osteoporosis Other    Social History   Socioeconomic History  . Marital status: Married    Spouse name: Not on file  . Number of  children: Not on file  . Years of education: Not on file  . Highest education level: Not on file  Occupational History  . Not on file  Tobacco Use  . Smoking status: Never Smoker  . Smokeless tobacco: Never Used  Vaping Use  . Vaping Use: Never used  Substance and Sexual Activity  . Alcohol use: Yes    Alcohol/week: 2.0 - 3.0 standard drinks    Types: 2 - 3 Glasses of wine per week    Comment: 2 to 3 glasses a week   . Drug use: No  . Sexual activity: Not on file  Other Topics Concern  . Not on file  Social History Narrative   Married   Retired  2008   Regular exercise-yes   [place] of 2   No pets   Social etoh   G2P2      Crystal Odom   Travel grandchildren    Social Determinants of Health   Financial Resource Strain: Low  Risk   . Difficulty of Paying Living Expenses: Not hard at all  Food Insecurity: No Food Insecurity  . Worried About Charity fundraiser in the Last Year: Never true  . Ran Out of Food in the Last Year: Never true  Transportation Needs: No Transportation Needs  . Lack of Transportation (Medical): No  . Lack of Transportation (Non-Medical): No  Physical Activity: Sufficiently Active  . Days of Exercise per Week: 7 days  . Minutes of Exercise per Session: 30 min  Stress: No Stress Concern Present  . Feeling of Stress : Not at all  Social Connections: Socially Integrated  . Frequency of Communication with Friends and Family: More than three times a week  . Frequency of Social Gatherings with Friends and Family: Twice a week  . Attends Religious Services: More than 4 times per year  . Active Member of Clubs or Organizations: Yes  . Attends Archivist Meetings: More than 4 times per year  . Marital Status: Married    Tobacco Counseling Counseling given: Not Answered   Clinical Intake:  Pre-visit preparation completed: Yes  Pain : No/denies pain     Nutritional Risks: None Diabetes: No  How often do you need to have someone help you when you read instructions, pamphlets, or other written materials from your doctor or pharmacy?: 1 - Never What is the last grade level you completed in school?: Bachelors Degree  Diabetic?No   Interpreter Needed?: No  Information entered by :: [place] of Daily Living In your present state of health, do you have any difficulty performing the following activities: 11/07/2020 07/25/2020  Hearing? Tempie Donning  Comment has bilateral hearing aids wears hearing aids  Vision? N N  Difficulty concentrating or making decisions? N N  Walking or climbing stairs? N N  Dressing or bathing? N N  Doing errands, shopping? N N  Preparing Food and eating ? N -  Using the Toilet? N -  In the past six months, have you accidently leaked  urine? N -  Do you have problems with loss of bowel control? N -  Managing your Medications? N -  Managing your Finances? N -  Housekeeping or managing your Housekeeping? N -  Some recent data might be hidden    Patient Care Team: Crystal Odom, Crystal Brooking, MD as PCP - General Crystal Frame Greer Ee, MD as PCP - Cardiology (Cardiology) Crystal Saas, MD (Orthopedic Surgery) Unice Bailey, MD (Internal Medicine)  Pyrtle, Lajuan Lines, MD as Consulting Physician (Gastroenterology) Pyrtle, Lajuan Lines, MD as Consulting Physician (Gastroenterology) Marica Otter, [place] (Optometry) Freada Bergeron, MD as Consulting Physician (Cardiology) Viona Gilmore, [facility] (Pharmacist) Viona Gilmore, [facility] as Pharmacist (Pharmacist)  Indicate any recent Medical Services you may have received from other than [facility] providers in the past year (date may be approximate).     Assessment:   This is a routine wellness examination for [place].  Hearing/Vision screen  Hearing Screening   '125Hz'$  $[place]'250Hz'zCdXs$'500Hz'$'1000Hz'$'2000Hz'$'3000Hz'$'4000Hz'$'6000Hz'$'8000Hz'$   Right ear:           Left ear:           Vision Screening Comments: Patient states gets eyes examined once per year. Has a hx of bilateral cataract extractions. Wears glasses.   Dietary issues and exercise activities discussed: Current Exercise Habits: Home exercise routine, Type of exercise: walking, Time (Minutes): 30, Frequency (Times/Week): 7, Weekly Exercise (Minutes/Week): 210, Intensity: Mild  Goals    . Patient Stated     Will add on resistance work or strength training  Go slow and low and get help at first     . Patient Stated     I will continue to walk 30 minutes daily     . Reduce sodium intake     Will try herbs substitute  Will give further info on dash diet  Trying to be plant based       Depression Screen PHQ 2/9 Scores 11/07/2020 07/25/2020 06/04/2019 06/02/2018 04/22/2018 07/05/2016 05/14/2016  PHQ - 2 Score 0 0 0 0 0 0 0    Fall Risk Fall Risk   11/07/2020 07/25/2020 06/04/2019 06/02/2018 04/22/2018  Falls in the past year? 0 0 0 No No  Comment - - - - -  Number falls in past yr: 0 0 0 - -  Comment - - - - -  Injury with Fall? 0 0 0 - -  Comment - - - - -  Risk for fall due to : No Fall Risks - - - -  Follow up Falls evaluation completed;Falls prevention discussed - Falls evaluation completed - -    FALL RISK PREVENTION PERTAINING TO THE HOME:  Any stairs in or around the home? Yes  If so, are there any without handrails? No  Home free of loose throw rugs in walkways, pet beds, electrical cords, etc? Yes  Adequate lighting in your home to reduce risk of falls? Yes   ASSISTIVE DEVICES UTILIZED TO PREVENT FALLS:  Life alert? No  Use of a cane, walker or w/c? No  Grab bars in the bathroom? No  Shower chair or bench in shower? No  Elevated toilet seat or a handicapped toilet? No     Cognitive Function:  Normal cognitive status assessed by direct observation by this Nurse Health Advisor. No abnormalities found.   MMSE - Mini Mental State Exam 04/22/2018 07/05/2016  Not completed: (No Data) (No Data)        Immunizations Immunization History  Administered Date(s) Administered  . Fluad Quad(high Dose 65+) 06/04/2019, 05/26/2020  . Hep A / Hep B 12/28/2009, 02/01/2010  . Influenza Split 06/17/2013  . Influenza Whole 08/10/2007, 07/07/2008  . Influenza, High Dose Seasonal PF 07/13/2014, 06/28/2015, 07/05/2016, 05/28/2017, 06/02/2018  . Influenza-Unspecified 06/28/2015  . PFIZER(Purple Top)SARS-COV-2 Vaccination 10/10/2019, 10/31/2019  . Pneumococcal Conjugate-13 05/02/2014  . Pneumococcal Polysaccharide-23 07/19/2008  . Td 10/31/2002  . Tdap 02/02/2013  . Zoster 08/10/2007  .  Zoster Recombinat (Shingrix) 10/31/2016, 01/28/2018    TDAP status: Up to date  Flu Vaccine status: Up to date  Pneumococcal vaccine status: Up to date  Covid-19 vaccine status: Completed vaccines  Qualifies for Shingles Vaccine? Yes    Zostavax completed Yes   Shingrix Completed?: Yes  Screening Tests Health Maintenance  Topic Date Due  . Hepatitis C Screening  Never done  . COVID-19 Vaccine (3 - Pfizer risk 4-dose series) 11/28/2019  . TETANUS/TDAP  02/03/2023  . INFLUENZA VACCINE  Completed  . DEXA SCAN  Completed  . PNA vac Low Risk Adult  Completed    Health Maintenance  Health Maintenance Due  Topic Date Due  . Hepatitis C Screening  Never done  . COVID-19 Vaccine (3 - Pfizer risk 4-dose series) 11/28/2019    Colorectal cancer screening: Type of screening: Colonoscopy. Completed 06/02/2014. Repeat every 10 years  Mammogram status: Completed 09/08/2020. Repeat every year  Bone Density status: Completed 05/26/2018. Results reflect: Bone density results: OSTEOPENIA. Repeat every 5 years.  Lung Cancer Screening: (Low Dose CT Chest recommended if Age 81-80 years, 30 pack-year currently smoking OR have quit w/in 15years.) does not qualify.   Lung Cancer Screening Referral: N/A   Additional Screening:  Hepatitis C Screening: does qualify;   Vision Screening: Recommended annual ophthalmology exams for early detection of glaucoma and other disorders of the eye. Is the patient up to date with their annual eye exam?  Yes  Who is the provider or what is the name of the office in which the patient attends annual eye exams? Dr. Bufford Buttner, Dr. Tommy Rainwater eye surgeon  If pt is not established with a provider, would they like to be referred to a provider to establish care? No .   Dental Screening: Recommended annual dental exams for proper oral hygiene  Community Resource Referral / Chronic Care Management: CRR required this visit?  No   CCM required this visit?  No      Plan:     I have personally reviewed and noted the following in the patient's chart:   . Medical and social history . Use of alcohol, tobacco or illicit drugs  . Current medications and supplements . Functional ability and  status . Nutritional status . Physical activity . Advanced directives . List of other physicians . Hospitalizations, surgeries, and ER visits in previous 12 months . Vitals . Screenings to include cognitive, depression, and falls . Referrals and appointments  In addition, I have reviewed and discussed with patient certain preventive protocols, quality metrics, and best practice recommendations. A written personalized care plan for preventive services as well as general preventive health recommendations were provided to patient.     [name], LPN   11/07/7865   Nurse Notes: None

## 2020-11-07 ENCOUNTER — Ambulatory Visit (INDEPENDENT_AMBULATORY_CARE_PROVIDER_SITE_OTHER): Payer: PPO

## 2020-11-07 DIAGNOSIS — Z0000 Encounter for general adult medical examination without abnormal findings: Secondary | ICD-10-CM | POA: Diagnosis not present

## 2020-11-07 NOTE — Patient Instructions (Addendum)
Crystal Odom , Thank you for taking time to come for your Medicare Wellness Visit. I appreciate your ongoing commitment to your health goals. Please review the following plan we discussed and let me know if I can assist you in the future.   Screening recommendations/referrals: Colonoscopy: Up to date, next due 06/02/2024 Mammogram: Up to date, next due 09/08/2021 Bone Density: Up to date, next due 05/27/2023 Recommended yearly ophthalmology/optometry visit for glaucoma screening and checkup Recommended yearly dental visit for hygiene and checkup  Vaccinations: Influenza vaccine: Up to date, next due fall 2022  Pneumococcal vaccine: Completed series  Tdap vaccine: Up to date, next due 02/03/2023 Shingles vaccine: Completed series     Advanced directives: Please  Bring in copies of your advanced medical directives so that we may scan into your chart.   Conditions/risks identified: None   Next appointment: 11/13/2020 @ 11:00 am via telephone with Pharmacist at [facility] 78 Years and Older, Female Preventive care refers to lifestyle choices and visits with your health care provider that can promote health and wellness. What does preventive care include?  A yearly physical exam. This is also called an annual well check.  Dental exams once or twice a year.  Routine eye exams. Ask your health care provider how often you should have your eyes checked.  Personal lifestyle choices, including:  Daily care of your teeth and gums.  Regular physical activity.  Eating a healthy diet.  Avoiding tobacco and drug use.  Limiting alcohol use.  Practicing safe sex.  Taking low-dose aspirin every day.  Taking vitamin and mineral supplements as recommended by your health care provider. What happens during an annual well check? The services and screenings done by your health care provider during your annual well check will depend on your age, overall health,  lifestyle risk factors, and family history of disease. Counseling  Your health care provider may ask you questions about your:  Alcohol use.  Tobacco use.  Drug use.  Emotional well-being.  Home and relationship well-being.  Sexual activity.  Eating habits.  History of falls.  Memory and ability to understand (cognition).  Work and work Statistician.  Reproductive health. Screening  You may have the following tests or measurements:  Height, weight, and BMI.  Blood pressure.  Lipid and cholesterol levels. These may be checked every 5 years, or more frequently if you are over 55 years old.  Skin check.  Lung cancer screening. You may have this screening every year starting at age 21 if you have a 30-pack-year history of smoking and currently smoke or have quit within the past 15 years.  Fecal occult blood test (FOBT) of the stool. You may have this test every year starting at age 16.  Flexible sigmoidoscopy or colonoscopy. You may have a sigmoidoscopy every 5 years or a colonoscopy every 10 years starting at age 64.  Hepatitis C blood test.  Hepatitis B blood test.  Sexually transmitted disease (STD) testing.  Diabetes screening. This is done by checking your blood sugar (glucose) after you have not eaten for a while (fasting). You may have this done every 1-3 years.  Bone density scan. This is done to screen for osteoporosis. You may have this done starting at age 38.  Mammogram. This may be done every 1-2 years. Talk to your health care provider about how often you should have regular mammograms. Talk with your health care provider about your test results, treatment options, and if  necessary, the need for more tests. Vaccines  Your health care provider may recommend certain vaccines, such as:  Influenza vaccine. This is recommended every year.  Tetanus, diphtheria, and acellular pertussis (Tdap, Td) vaccine. You may need a Td booster every 10 years.  Zoster  vaccine. You may need this after age 24.  Pneumococcal 13-valent conjugate (PCV13) vaccine. One dose is recommended after age 27.  Pneumococcal polysaccharide (PPSV23) vaccine. One dose is recommended after age 23. Talk to your health care provider about which screenings and vaccines you need and how often you need them. This information is not intended to replace advice given to you by your health care provider. Make sure you discuss any questions you have with your health care provider. Document Released: 10/13/2015 Document Revised: 06/05/2016 Document Reviewed: 07/18/2015 Elsevier Interactive Patient Education  2017 [place] Prevention in the Home Falls can cause injuries. They can happen to people of all ages. There are many things you can do to make your home safe and to help prevent falls. What can I do on the outside of my home?  Regularly fix the edges of walkways and driveways and fix any cracks.  Remove anything that might make you trip as you walk through a door, such as a raised step or threshold.  Trim any bushes or trees on the path to your home.  Use bright outdoor lighting.  Clear any walking paths of anything that might make someone trip, such as rocks or tools.  Regularly check to see if handrails are loose or broken. Make sure that both sides of any steps have handrails.  Any raised decks and porches should have guardrails on the edges.  Have any leaves, snow, or ice cleared regularly.  Use sand or salt on walking paths during winter.  Clean up any spills in your garage right away. This includes oil or grease spills. What can I do in the bathroom?  Use night lights.  Install grab bars by the toilet and in the tub and shower. Do not use towel bars as grab bars.  Use non-skid mats or decals in the tub or shower.  If you need to sit down in the shower, use a plastic, non-slip stool.  Keep the floor dry. Clean up any water that spills on the  floor as soon as it happens.  Remove soap buildup in the tub or shower regularly.  Attach bath mats securely with double-sided non-slip rug tape.  Do not have throw rugs and other things on the floor that can make you trip. What can I do in the bedroom?  Use night lights.  Make sure that you have a light by your bed that is easy to reach.  Do not use any sheets or blankets that are too big for your bed. They should not hang down onto the floor.  Have a firm chair that has side arms. You can use this for support while you get dressed.  Do not have throw rugs and other things on the floor that can make you trip. What can I do in the kitchen?  Clean up any spills right away.  Avoid walking on wet floors.  Keep items that you use a lot in easy-to-reach places.  If you need to reach something above you, use a strong step stool that has a grab bar.  Keep electrical cords out of the way.  Do not use floor polish or wax that makes floors slippery. If you must  use wax, use non-skid floor wax.  Do not have throw rugs and other things on the floor that can make you trip. What can I do with my stairs?  Do not leave any items on the stairs.  Make sure that there are handrails on both sides of the stairs and use them. Fix handrails that are broken or loose. Make sure that handrails are as long as the stairways.  Check any carpeting to make sure that it is firmly attached to the stairs. Fix any carpet that is loose or worn.  Avoid having throw rugs at the top or bottom of the stairs. If you do have throw rugs, attach them to the floor with carpet tape.  Make sure that you have a light switch at the top of the stairs and the bottom of the stairs. If you do not have them, ask someone to add them for you. What else can I do to help prevent falls?  Wear shoes that:  Do not have high heels.  Have rubber bottoms.  Are comfortable and fit you well.  Are closed at the toe. Do not wear  sandals.  If you use a stepladder:  Make sure that it is fully opened. Do not climb a closed stepladder.  Make sure that both sides of the stepladder are locked into place.  Ask someone to hold it for you, if possible.  Clearly mark and make sure that you can see:  Any grab bars or handrails.  First and last steps.  Where the edge of each step is.  Use tools that help you move around (mobility aids) if they are needed. These include:  Canes.  Walkers.  Scooters.  Crutches.  Turn on the lights when you go into a dark area. Replace any light bulbs as soon as they burn out.  Set up your furniture so you have a clear path. Avoid moving your furniture around.  If any of your floors are uneven, fix them.  If there are any pets around you, be aware of where they are.  Review your medicines with your doctor. Some medicines can make you feel dizzy. This can increase your chance of falling. Ask your doctor what other things that you can do to help prevent falls. This information is not intended to replace advice given to you by your health care provider. Make sure you discuss any questions you have with your health care provider. Document Released: 07/13/2009 Document Revised: 02/22/2016 Document Reviewed: 10/21/2014 Elsevier Interactive Patient Education  2017 [employer].

## 2020-11-09 ENCOUNTER — Telehealth: Payer: Self-pay | Admitting: Pharmacist

## 2020-11-09 NOTE — Chronic Care Management (AMB) (Signed)
    Chronic Care Management Pharmacy Assistant   Name: Crystal Odom  MRN: [id number] DOB: 1941/05/09  Reason for Encounter: Medication Review/Initial Questions for Pharmacist visit on 11-13-2020  Patient Questions: 1. Have you seen any other providers since your last visit? Rolley Sims, MD Cardiology 2. Any changes in your medications or health? . Esomeprazole (NEXIUM) 40 MG capsule (new) . Metoprolol succinate (TOPROL XL) 25 MG 24 hr tablet (dose increased) 3. Any side effects from any medications? No 4. Do you have any symptoms or problems not managed by your medications?  5. Any concerns about your health right now? No 6. Has your provider asked that you check blood pressure, blood sugar, or follow a special diet at home?  Marland Kitchen She takes her blood pressure three to four times a week. 7. Do you get any type of exercise regularly?  Marland Kitchen She walks at least one hour daily . a yoga class once a week 8. Can you think of a goal you would like to reach for your health?  . Maintain her current health 9. Do you have any problems getting your medications? No 10. Is there anything that you would like to discuss during the appointment? No  The patient was asked to please bring medications, blood pressure/ blood sugar log, and supplements to her appointment.  PCP : Burnis Medin, MD  Allergies:   Allergies  Allergen Reactions  . Penicillins     hives    Medications: Outpatient Encounter Medications as of 11/09/2020  Medication Sig  . apixaban (ELIQUIS) 5 MG TABS tablet Take 1 tablet (5 mg total) by mouth 2 (two) times daily.  . cholecalciferol (VITAMIN D3) 25 MCG (1000 UNIT) tablet Take 2,000 Units by mouth daily.   Marland Kitchen esomeprazole (NEXIUM) 40 MG capsule Take one tablet by mouth daily  . levothyroxine (SYNTHROID) 88 MCG tablet TAKE 1 TABLET BY MOUTH EVERY DAY  . metoprolol succinate (TOPROL XL) 25 MG 24 hr tablet Take 0.5 tablets (12.5 mg total) by mouth 2 (two)  times daily.  . rosuvastatin (CRESTOR) 5 MG tablet Take 1 tablet (5 mg total) by mouth daily.   No facility-administered encounter medications on file as of 11/09/2020.    Current Diagnosis: Patient Active Problem List   Diagnosis Date Noted  . Incidental pulmonary nodule, > 23mm and < 49mm 06/27/2016  . Agatston coronary artery calcium score less than 100  0 06/27/2016  . Aortic valve calcification 06/27/2016  . Medicare annual wellness visit, subsequent 05/02/2014  . Personal history of kidney stones 01/09/2012  . Abnormal LFTs   . Leukopenia   . Family history of autoimmune disorder 02/06/2011  . UNSPECIFIED ARTHOPATHY, HAND 10/03/2009  . Chest pain 12/05/2008  . LIVER FUNCTION TESTS, ABNORMAL 10/31/2008  . NEUTROPENIA UNSPECIFIED 08/10/2007  . Disorder of bone and cartilage 08/10/2007  . Hypothyroidism 06/16/2007  . Hyperlipidemia with target LDL less than 100 06/16/2007    Goals Addressed   None     Follow-Up:  Pharmacist Review  Maia Breslow, [place] Assistant [phone]

## 2020-11-13 ENCOUNTER — Telehealth: Payer: Self-pay

## 2020-11-13 ENCOUNTER — Ambulatory Visit (INDEPENDENT_AMBULATORY_CARE_PROVIDER_SITE_OTHER): Payer: PPO | Admitting: Pharmacist

## 2020-11-13 DIAGNOSIS — E785 Hyperlipidemia, unspecified: Secondary | ICD-10-CM

## 2020-11-13 DIAGNOSIS — E039 Hypothyroidism, unspecified: Secondary | ICD-10-CM | POA: Diagnosis not present

## 2020-11-13 DIAGNOSIS — I10 Essential (primary) hypertension: Secondary | ICD-10-CM

## 2020-11-13 NOTE — Progress Notes (Signed)
Chronic Care Management Pharmacy Note  11/22/2020 Name:  Crystal Odom MRN:  [id number] DOB:  1941/07/03  Subjective: Crystal Odom is an 80 y.o. year old female who is a primary patient of Panosh, Standley Brooking, MD.  The CCM team was consulted for assistance with disease management and care coordination needs.    Engaged with patient by telephone for initial visit in response to provider referral for pharmacy case management and/or care coordination services.   Consent to Services:  The patient was given the following information about Chronic Care Management services today, agreed to services, and gave verbal consent: 1. CCM service includes personalized support from designated clinical staff supervised by the primary care provider, including individualized plan of care and coordination with other care providers 2. 24/7 contact phone numbers for assistance for urgent and routine care needs. 3. Service will only be billed when office clinical staff spend 20 minutes or more in a month to coordinate care. 4. Only one practitioner may furnish and bill the service in a calendar month. 5.The patient may stop CCM services at any time (effective at the end of the month) by phone call to the office staff. 6. The patient will be responsible for cost sharing (co-pay) of up to 20% of the service fee (after annual deductible is met). Patient agreed to services and consent obtained.  Patient Care Team: Panosh, Standley Brooking, MD as PCP - General Crystal Frame Greer Ee, MD as PCP - Cardiology (Cardiology) Crystal Saas, MD (Orthopedic Surgery) Crystal Bailey, MD (Internal Medicine) Pyrtle, Lajuan Lines, MD as Consulting Physician (Gastroenterology) Pyrtle, Lajuan Lines, MD as Consulting Physician (Gastroenterology) Crystal Odom, OD (Optometry) Crystal Bergeron, MD as Consulting Physician (Cardiology) Crystal Odom, [facility] as Pharmacist (Pharmacist)  Recent office visits: 11/07/20 Crystal Neas, LPN: Patient  presented for medicare annual wellness visit.   07/25/20 Crystal Ace, MD: Patient presented for annual exam. BP slightly elevated.  06/21/20 Crystal Ace, MD: Patient presented for dizziness. Placed referral for cardiology.  Recent consult visits: 10/31/20 Crystal Kaufman, MD (Cardiology): Patient presented for Afib follow up. LDL was elevated. Prescribed esomeprazole and increased metoprolol to 12.5 mg BID.  08/01/20 Cardiology telephone encounter: New rx sent in for Eliquis 5 mg BID and Toprol XL 12.5 mg daily.  07/07/20 Crystal Kaufman, MD (Cardiology): Patient presented for Afib follow up. Prescribed rosuvastatin 5 mg daily and ordered Afib home monitor. Stopped aspirin daily.  02/07/20 Crystal Odom (ortho): Unable to access notes.   Hospital visits: None in previous 6 months  Objective:  Lab Results  Component Value Date   CREATININE 0.64 06/22/2020   BUN 18 06/22/2020   GFR 87.07 06/04/2019   GFRNONAA 86 06/22/2020   GFRAA 100 06/22/2020   NA 140 06/22/2020   K 4.8 06/22/2020   CALCIUM 9.7 06/22/2020   CO2 32 06/22/2020    Lab Results  Component Value Date/Time   HGBA1C 5.6 06/02/2018 08:36 AM   GFR 87.07 06/04/2019 09:06 AM   GFR 83.93 06/02/2018 08:36 AM    Last diabetic Eye exam: No results found for: HMDIABEYEEXA  Last diabetic Foot exam: No results found for: HMDIABFOOTEX   Lab Results  Component Value Date   CHOL 239 (H) 10/31/2020   HDL 118 10/31/2020   LDLCALC 111 (H) 10/31/2020   LDLDIRECT 149.6 02/02/2013   TRIG 61 10/31/2020   CHOLHDL 2.0 10/31/2020    Hepatic Function Latest Ref Rng & Units 06/22/2020 06/04/2019 06/02/2018  Total Protein 6.1 - 8.1 g/dL 7.1 7.4 7.1  Albumin 3.5 - 5.2 g/dL - 4.2 4.2  AST 10 - 35 U/L _0 ALT 6 - 29 U/L _1 Alk Phosphatase 39 - 117 U/L - 74 68  Total Bilirubin 0.2 - 1.2 mg/dL 0.7 0.8 0.5  Bilirubin, Direct 0.0 - 0.2 mg/dL 0.1 0.1 0.1    Lab Results  Component Value Date/Time   TSH 1.68 06/22/2020  07:39 AM   TSH 1.59 06/04/2019 09:06 AM   FREET4 1.6 06/22/2020 07:39 AM    CBC Latest Ref Rng & Units 10/31/2020 06/22/2020 06/04/2019  WBC 3.4 - 10.8 x10E3/uL 3.4 2.4(L) 2.5(L)  Hemoglobin 11.1 - 15.9 g/dL 14.2 13.3 13.8  Hematocrit 34.0 - 46.6 % 42.0 39.9 41.0  Platelets 150 - 450 x10E3/uL 245 272 251.0    Lab Results  Component Value Date/Time   VD25OH 47 01/08/2012 10:33 AM    Clinical ASCVD: No  The ASCVD Risk score Crystal Odom., et al., 2013) failed to calculate for the following reasons:   The systolic blood pressure is missing   The valid HDL cholesterol range is 20 to 100 mg/dL    Depression screen [facility] 2/9 11/07/2020 07/25/2020 06/04/2019  Decreased Interest 0 0 0  Down, Depressed, Hopeless 0 0 0  PHQ - 2 Score 0 0 0     CHA2DS2/VAS Stroke Risk Points  Current as of yesterday     4 >= 2 Points: High Risk  1 - 1.99 Points: Medium Risk  0 Points: Low Risk    No Change      Details    This score determines the patient's risk of having a stroke if the  patient has atrial fibrillation.       Points Metrics  0 Has Congestive Heart Failure:  No    Current as of yesterday  0 Has Vascular Disease:  No    Current as of yesterday  1 Has Hypertension:  Yes    Current as of yesterday  2 Age:  18    Current as of yesterday  0 Has Diabetes:  No    Current as of yesterday  0 Had Stroke:  No  Had TIA:  No  Had Thromboembolism:  No    Current as of yesterday  1 Female:  Yes    Current as of yesterday        Social History   Tobacco Use  Smoking Status Never Smoker  Smokeless Tobacco Never Used   BP Readings from Last 3 Encounters:  10/31/20 130/90  07/25/20 (!) 142/80  07/07/20 (!) 149/87   Pulse Readings from Last 3 Encounters:  10/31/20 (!) 116  07/25/20 (!) 58  07/07/20 70   Wt Readings from Last 3 Encounters:  10/31/20 129 lb 6.4 oz (58.7 kg)  07/25/20 127 lb (57.6 kg)  07/07/20 128 lb 9.6 oz (58.3 kg)    Assessment/Interventions: Review of patient  past medical history, allergies, medications, health status, including review of consultants reports, laboratory and other test data, was performed as part of comprehensive evaluation and provision of chronic care management services.   SDOH:  (Social Determinants of Health) assessments and interventions performed: Yes SDOH Interventions   Flowsheet Row Most Recent Value  SDOH Interventions   Financial Strain Interventions Intervention Not Indicated  Transportation Interventions Intervention Not Indicated      Patient is retired and lives with her husband who is also retired. Her regular routine is walking in the morning for half an hour  every morning.  Patient has a lot of zoom meetings (for community and church) and has been very COVID cautious. Patient and husband are very active in church and they wear masks at church.   Patient enjoys cooking and usually eat a light breakfast and lunch and tries to eat healthy. She does admit that she probably eats things she probably shouldn't sometimes. Patient and husband eat more vegetables than meat and fix meat as a side. For meat, they usually have chicken and fish and occasionally red meat. Patient eats out very rarely and brings take out every once in awhile. Patient doesn't eat very many carbs as she used to and defines her diet as closer to [employer].  Patient belongs to a sports club and goes to one yoga class a week and tries to do some weight resistance activity at the gym as well.   Patient reports that she is a good sleeper and most of the time she sleeps soundly and feels rested when she wakes up. Patient admits to the occasional trouble falling asleep but usually gets about 7-8 hours per night. Sometimes she has trouble falling asleep if she drinks coffee too late in the evening.  Patient denies any problems with her current medications. Patient has not had any problems with side effects and seems to be tolerating everything  well.  CCM Care Plan  Allergies  Allergen Reactions  . Penicillins     hives    Medications Reviewed Today    Reviewed by Crystal Odom, [facility] (Pharmacist) on 11/13/20 at 1145  Med List Status: <None>  Medication Order Taking? Sig Documenting Provider Last Dose Status Informant  apixaban (ELIQUIS) 5 MG TABS tablet [id number] Yes Take 1 tablet (5 mg total) by mouth 2 (two) times daily. Crystal Bergeron, MD Taking Active   cholecalciferol (VITAMIN D3) 25 MCG (1000 UNIT) tablet [id number] Yes Take 2,000 Units by mouth daily.  [provider] Taking Active   esomeprazole (NEXIUM) 40 MG capsule [id number] Yes Take one tablet by mouth daily Crystal Bergeron, MD Taking Active   levothyroxine (SYNTHROID) 88 MCG tablet [id number] Yes TAKE 1 TABLET BY MOUTH EVERY DAY Panosh, Standley Brooking, MD Taking Active   metoprolol succinate (TOPROL XL) 25 MG 24 hr tablet [id number] Yes Take 0.5 tablets (12.5 mg total) by mouth 2 (two) times daily. Crystal Bergeron, MD Taking Active   rosuvastatin (CRESTOR) 5 MG tablet [id number] Yes Take 1 tablet (5 mg total) by mouth daily. Crystal Bergeron, MD Taking Expired 10/05/20 2359           Patient Active Problem List   Diagnosis Date Noted  . Incidental pulmonary nodule, > 50m and < 847m09/28/2017  . Agatston coronary artery calcium score less than 100  0 06/27/2016  . Aortic valve calcification 06/27/2016  . Medicare annual wellness visit, subsequent 05/02/2014  . Personal history of kidney stones 01/09/2012  . Abnormal LFTs   . Leukopenia   . Family history of autoimmune disorder 02/06/2011  . UNSPECIFIED ARTHOPATHY, HAND 10/03/2009  . Chest pain 12/05/2008  . LIVER FUNCTION TESTS, ABNORMAL 10/31/2008  . NEUTROPENIA UNSPECIFIED 08/10/2007  . Disorder of bone and cartilage 08/10/2007  . Hypothyroidism 06/16/2007  . Hyperlipidemia with target LDL less than 100 06/16/2007    Immunization History  Administered Date(s) Administered  .  Fluad Quad(high Dose 65+) 06/04/2019, 05/26/2020  . Hep A / Hep B 12/28/2009, 02/01/2010  . Influenza Split 06/17/2013  . Influenza Whole 08/10/2007,  07/07/2008  . Influenza, High Dose Seasonal PF 07/13/2014, 06/28/2015, 07/05/2016, 05/28/2017, 06/02/2018  . Influenza-Unspecified 06/28/2015  . PFIZER(Purple Top)SARS-COV-2 Vaccination 10/10/2019, 10/31/2019, 07/03/2020  . Pneumococcal Conjugate-13 05/02/2014  . Pneumococcal Polysaccharide-23 07/19/2008  . Td 10/31/2002  . Tdap 02/02/2013  . Zoster 08/10/2007  . Zoster Recombinat (Shingrix) 10/31/2016, 01/28/2018    Conditions to be addressed/monitored:  Hypertension, Hyperlipidemia, Atrial Fibrillation and Hypothyroidism  Care Plan : CCM Pharmacy Care Plan  Updates made by Crystal Odom, [place] since 11/22/2020 12:00 AM    Problem: Problem: Hypertension, Hyperlipidemia, Atrial Fibrillation and Hypothyroidism     Long-Range Goal: Patient-Specific Goal   Start Date: 11/13/2020  Expected End Date: 11/13/2021  This Visit's Progress: On track  Priority: High  Note:   Current Barriers:  . Unable to independently monitor therapeutic efficacy . Unable to achieve control of cholesterol   Pharmacist Clinical Goal(s):  Marland Kitchen Over the next 180 days, patient will achieve adherence to monitoring guidelines and medication adherence to achieve therapeutic efficacy through collaboration with PharmD and provider.   Interventions: . 1:1 collaboration with Panosh, Standley Brooking, MD regarding development and update of comprehensive plan of care as evidenced by provider attestation and co-signature . Inter-disciplinary care team collaboration (see longitudinal plan of care) . Comprehensive medication review performed; medication list updated in electronic medical record  Hypertension (BP goal <140/90) -Controlled -Current treatment: . Metoprolol succinate 12.5 mg twice daily -Medications previously tried:  lisinopril (switched)  -Current home readings:  ranges from 106-138; lower in the morning than afternoon -Current dietary habits: patient looks at package labels and tries to limit salt intake (buys lower sodium canned foods) -Current exercise habits: walking daily and doing one yoga class per week in addition to weight strengthening at the gym -Reports hypotensive/hypertensive symptoms when she took metoprolol on an empty stomach -Educated on BP goals and benefits of medications for prevention of heart attack, stroke and kidney damage; Importance of home blood pressure monitoring; Symptoms of hypotension and importance of maintaining adequate hydration; -Counseled to monitor BP at home at least weekly, document, and provide log at future appointments -Counseled on diet and exercise extensively Recommended to continue current medication Recommended lower sodium Morton's lite salt  Hyperlipidemia: (LDL goal < 100) -Uncontrolled -Current treatment: . Rosuvastatin 5 mg 1 tablet daily -Medications previously tried: none -Current dietary patterns: uses cooking spray and canola and olive oil occasionally and sometimes real butter; patient eats lots of fiber including whole grains for bread -Current exercise habits: walking daily and doing one yoga class per week in addition to weight strengthening at the gym -Educated on Cholesterol goals;  Benefits of statin for ASCVD risk reduction; Importance of limiting foods high in cholesterol; -Recommended to continue current medication  Atrial Fibrillation (Goal: prevent stroke and major bleeding) -Controlled -CHADSVASC: 4 -Current treatment: . Rate control: metoprolol succinate 12.5 mg 1 tablet twice daily . Anticoagulation: Eliquis 5 mg 1 tablet twice daily -Medications previously tried: none -Home BP and HR readings: 52-58  -Counseled on bleeding risk associated with Eliquis and importance of self-monitoring for signs/symptoms of bleeding; avoidance of NSAIDs due to increased bleeding risk  with anticoagulants; -Recommended to continue current medication  Hypothyroidism (Goal: maintain TSH between 0.35-4.5) -Controlled -Current treatment  . Levothyroxine 88 mcg 1 tablet daily -Medications previously tried: none  -Recommended to continue current medication Counseled on taking the medication on an empty stomach separate from other medications  Coat stomach lining (Goal: minimize symptoms of heartburn or indigestion) -Controlled -Current treatment  .  Esomeprazole 40 mg 1 capsule daily -Medications previously tried: none -Recommended to continue current medication Counseled on taking 30 minutes before a meal  Osteopenia (Goal improve bone density and prevent fractures) -Uncontrolled -Last DEXA Scan: 04/2018  T-Score femoral neck: -1.7, -1.9  T-Score total hip: n/a  T-Score lumbar spine: -0.8  T-Score forearm radius: n/a  10-year probability of major osteoporotic fracture: 11.3%  10-year probability of hip fracture: 2.9% -Patient is not a candidate for pharmacologic treatment -Current treatment  . Vitamin D 1000 units 2 tablets daily -Medications previously tried: calcium  -Recommend [phone] units of vitamin D daily. Recommend 1200 mg of calcium daily from dietary and supplemental sources. Recommend weight-bearing and muscle strengthening exercises for building and maintaining bone density. -Recommended to continue current medication Counseled on dietary sources of calcium such as dairy products, leafy green vegetables, beans, etc.   Health Maintenance -Vaccine gaps: none -Current therapy:  . none -Educated on Cost vs benefit of each product must be carefully weighed by individual consumer -Patient is satisfied with current therapy and denies issues -Recommended to continue without supplementation  Patient Goals/Self-Care Activities . Over the next 180 days, patient will:  - take medications as prescribed check blood pressure at least weekly, document, and  provide at future appointments  Follow Up Plan: Telephone follow up appointment with care management team member scheduled for: 6 months        Medication Assistance: None required.  Patient affirms current coverage meets needs.  Patient's preferred pharmacy is:  [id number] [place], [place] - [address] [place]Phone: [phone]: [phone] [facility] [address] [place]Phone: [phone]: [phone] $45 a month for Eliquis - discussed patient assistance and donut hole  Uses pill box? No - all bottles in one container and takes those together Pt endorses 100% compliance  We discussed: Current pharmacy is preferred with insurance plan and patient is satisfied with pharmacy services Patient decided to: Continue current medication management strategy  Care Plan and Follow Up Patient Decision:  Patient agrees to Care Plan and Follow-up.  Plan: Telephone follow up appointment with care management team member scheduled for:  6 months  MJeni Salles PharmD BSinking SpringPharmacist LEche[place]at [phone]

## 2020-11-13 NOTE — Telephone Encounter (Signed)
-----   Message from Viona Gilmore, [facility] sent at 11/13/2020  7:47 AM EST ----- Regarding: CCM referral Hi,  Can you please put in a CCM referral for Ms. Chelle Cayton, one of Dr. Velora Mediate patients?  Thank you, Maddie

## 2020-11-22 NOTE — Patient Instructions (Addendum)
Hi Crystal Odom,  It was so lovely to meet you over the phone! Below is a summary of the topics we addressed. I also attached some information about ways to lower cholesterol through diet and ways to make sure you are getting enough calcium every day to strengthen your bones.  Please give me a call if you have any questions or need anything before our follow up!  Best, Maddie  Crystal Odom, PharmD [facility] Clinical Pharmacist [place] [facility] [place] [phone]    Visit Information  Patient Care Plan: CCM Pharmacy Care Plan    Problem Identified: Problem: Hypertension, Hyperlipidemia, Atrial Fibrillation and Hypothyroidism     Long-Range Goal: Patient-Specific Goal   Start Date: 11/13/2020  Expected End Date: 11/13/2021  This Visit's Progress: On track  Priority: High  Note:   Current Barriers:  . Unable to independently monitor therapeutic efficacy . Unable to achieve control of cholesterol   Pharmacist Clinical Goal(s):  Marland Kitchen Over the next 180 days, patient will achieve adherence to monitoring guidelines and medication adherence to achieve therapeutic efficacy through collaboration with PharmD and provider.   Interventions: . 1:1 collaboration with Panosh, Standley Brooking, MD regarding development and update of comprehensive plan of care as evidenced by provider attestation and co-signature . Inter-disciplinary care team collaboration (see longitudinal plan of care) . Comprehensive medication review performed; medication list updated in electronic medical record  Hypertension (BP goal <140/90) -Controlled -Current treatment: . Metoprolol succinate 12.5 mg twice daily -Medications previously tried:  lisinopril (switched)  -Current home readings: ranges from 106-138; lower in the morning than afternoon -Current dietary habits: patient looks at package labels and tries to limit salt intake (buys lower sodium canned foods) -Current exercise habits: walking daily and doing one yoga  class per week in addition to weight strengthening at the gym -Reports hypotensive/hypertensive symptoms when she took metoprolol on an empty stomach -Educated on BP goals and benefits of medications for prevention of heart attack, stroke and kidney damage; Importance of home blood pressure monitoring; Symptoms of hypotension and importance of maintaining adequate hydration; -Counseled to monitor BP at home at least weekly, document, and provide log at future appointments -Counseled on diet and exercise extensively Recommended to continue current medication Recommended lower sodium Morton's lite salt  Hyperlipidemia: (LDL goal < 100) -Uncontrolled -Current treatment: . Rosuvastatin 5 mg 1 tablet daily -Medications previously tried: none -Current dietary patterns: uses cooking spray and canola and olive oil occasionally and sometimes real butter; patient eats lots of fiber including whole grains for bread -Current exercise habits: walking daily and doing one yoga class per week in addition to weight strengthening at the gym -Educated on Cholesterol goals;  Benefits of statin for ASCVD risk reduction; Importance of limiting foods high in cholesterol; -Recommended to continue current medication  Atrial Fibrillation (Goal: prevent stroke and major bleeding) -Controlled -CHADSVASC: 4 -Current treatment: . Rate control: metoprolol succinate 12.5 mg 1 tablet twice daily . Anticoagulation: Eliquis 5 mg 1 tablet twice daily -Medications previously tried: none -Home BP and HR readings: 52-58  -Counseled on bleeding risk associated with Eliquis and importance of self-monitoring for signs/symptoms of bleeding; avoidance of NSAIDs due to increased bleeding risk with anticoagulants; -Recommended to continue current medication  Hypothyroidism (Goal: maintain TSH between 0.35-4.5) -Controlled -Current treatment  . Levothyroxine 88 mcg 1 tablet daily -Medications previously tried: none   -Recommended to continue current medication Counseled on taking the medication on an empty stomach separate from other medications  Coat stomach lining (Goal: minimize  symptoms of heartburn or indigestion) -Controlled -Current treatment  . Esomeprazole 40 mg 1 capsule daily -Medications previously tried: none -Recommended to continue current medication Counseled on taking 30 minutes before a meal  Osteopenia (Goal improve bone density and prevent fractures) -Uncontrolled -Last DEXA Scan: 04/2018  T-Score femoral neck: -1.7, -1.9  T-Score total hip: n/a  T-Score lumbar spine: -0.8  T-Score forearm radius: n/a  10-year probability of major osteoporotic fracture: 11.3%  10-year probability of hip fracture: 2.9% -Patient is not a candidate for pharmacologic treatment -Current treatment  . Vitamin D 1000 units 2 tablets daily -Medications previously tried: calcium  -Recommend [id number] units of vitamin D daily. Recommend 1200 mg of calcium daily from dietary and supplemental sources. Recommend weight-bearing and muscle strengthening exercises for building and maintaining bone density. -Recommended to continue current medication Counseled on dietary sources of calcium such as dairy products, leafy green vegetables, beans, etc.   Health Maintenance -Vaccine gaps: none -Current therapy:  . none -Educated on Cost vs benefit of each product must be carefully weighed by individual consumer -Patient is satisfied with current therapy and denies issues -Recommended to continue without supplementation  Patient Goals/Self-Care Activities . Over the next 180 days, patient will:  - take medications as prescribed check blood pressure at least weekly, document, and provide at future appointments  Follow Up Plan: Telephone follow up appointment with care management team member scheduled for: 6 months      Crystal Odom was given information about Chronic Care Management services today  including:  1. CCM service includes personalized support from designated clinical staff supervised by her physician, including individualized plan of care and coordination with other care providers 2. 24/7 contact phone numbers for assistance for urgent and routine care needs. 3. Standard insurance, coinsurance, copays and deductibles apply for chronic care management only during months in which we provide at least 20 minutes of these services. Most insurances cover these services at 100%, however patients may be responsible for any copay, coinsurance and/or deductible if applicable. This service may help you avoid the need for more expensive face-to-face services. 4. Only one practitioner may furnish and bill the service in a calendar month. 5. The patient may stop CCM services at any time (effective at the end of the month) by phone call to the office staff.  Patient agreed to services and verbal consent obtained.   The patient verbalized understanding of instructions, educational materials, and care plan provided today and agreed to receive a mailed copy of patient instructions, educational materials, and care plan.  Telephone follow up appointment with pharmacy team member scheduled for: 6 months  Viona Gilmore, [facility]  Fat and Cholesterol Restricted Eating Plan Eating a diet that limits fat and cholesterol may help lower your risk for heart disease and other conditions. Your body needs fat and cholesterol for basic functions, but eating too much of these things can be harmful to your health. Your health care provider may order lab tests to check your blood fat (lipid) and cholesterol levels. This helps your health care provider understand your risk for certain conditions and whether you need to make diet changes. Work with your health care provider or dietitian to make an eating plan that is right for you. Your plan includes:  Limit your fat intake to ______% or less of your total calories a  day.  Limit your saturated fat intake to ______% or less of your total calories a day.  Limit the amount of cholesterol  in your diet to less than _________mg a day.  Eat ___________ g of fiber a day. What are tips for following this plan? General guidelines  If you are overweight, work with your health care provider to lose weight safely. Losing just 5-10% of your body weight can improve your overall health and help prevent diseases such as diabetes and heart disease.  Avoid: ? Foods with added sugar. ? Fried foods. ? Foods that contain partially hydrogenated oils, including stick margarine, some tub margarines, cookies, crackers, and other baked goods.  Limit alcohol intake to no more than 1 drink a day for nonpregnant women and 2 drinks a day for men. One drink equals 12 oz of beer, 5 oz of wine, or 1 oz of hard liquor.   Reading food labels  Check food labels for: ? Trans fats, partially hydrogenated oils, or high amounts of saturated fat. Avoid foods that contain saturated fat and trans fat. ? The amount of cholesterol in each serving. Try to eat no more than 200 mg of cholesterol each day. ? The amount of fiber in each serving. Try to eat at least 20-30 g of fiber each day.  Choose foods with healthy fats, such as: ? Monounsaturated and polyunsaturated fats. These include olive and canola oil, flaxseeds, walnuts, almonds, and seeds. ? Omega-3 fats. These are found in foods such as salmon, mackerel, sardines, tuna, flaxseed oil, and ground flaxseeds.  Choose grain products that have whole grains. Look for the word "whole" as the first word in the ingredient list. Cooking  Cook foods using methods other than frying. Baking, boiling, grilling, and broiling are some healthy options.  Eat more home-cooked food and less restaurant, buffet, and fast food.  Avoid cooking using saturated fats. ? Animal sources of saturated fats include meats, butter, and cream. ? Plant sources of  saturated fats include palm oil, palm kernel oil, and coconut oil. Meal planning  At meals, imagine dividing your plate into fourths: ? Fill one-half of your plate with vegetables and green salads. ? Fill one-fourth of your plate with whole grains. ? Fill one-fourth of your plate with lean protein foods.  Eat fish that is high in omega-3 fats at least two times a week.  Eat more foods that contain fiber, such as whole grains, beans, apples, broccoli, carrots, peas, and barley. These foods help promote healthy cholesterol levels in the blood.   Recommended foods Grains  Whole grains, such as whole wheat or whole grain breads, crackers, cereals, and pasta. Unsweetened oatmeal, bulgur, barley, quinoa, or brown rice. Corn or whole wheat flour tortillas. Vegetables  Fresh or frozen vegetables (raw, steamed, roasted, or grilled). Green salads. Fruits  All fresh, canned (in natural juice), or frozen fruits. Meats and other protein foods  Ground beef (85% or leaner), grass-fed beef, or beef trimmed of fat. Skinless chicken or [place]. Ground chicken or [place]. Pork trimmed of fat. All fish and seafood. Egg whites. Dried beans, peas, or lentils. Unsalted nuts or seeds. Unsalted canned beans. Natural nut butters without added sugar and oil. Dairy  Low-fat or nonfat dairy products, such as skim or 1% milk, 2% or reduced-fat cheeses, low-fat and fat-free ricotta or cottage cheese, or plain low-fat and nonfat yogurt. Fats and oils  Tub margarine without trans fats. Light or reduced-fat mayonnaise and salad dressings. Avocado. Olive, canola, sesame, or safflower oils. The items listed above may not be a complete list of foods and beverages you can eat. Contact a dietitian for  more information. Foods to avoid Grains  White bread. White pasta. White rice. Cornbread. Bagels, pastries, and croissants. Crackers and snack foods that contain trans fat and hydrogenated oils. Vegetables  Vegetables  cooked in cheese, cream, or butter sauce. Fried vegetables. Fruits  Canned fruit in heavy syrup. Fruit in cream or butter sauce. Fried fruit. Meats and other protein foods  Fatty cuts of meat. Ribs, chicken wings, bacon, sausage, bologna, salami, chitterlings, fatback, hot dogs, bratwurst, and packaged lunch meats. Liver and organ meats. Whole eggs and egg yolks. Chicken and [place] with skin. Fried meat. Dairy  Whole or 2% milk, cream, half-and-half, and cream cheese. Whole milk cheeses. Whole-fat or sweetened yogurt. Full-fat cheeses. Nondairy creamers and whipped toppings. Processed cheese, cheese spreads, and cheese curds. Beverages  Alcohol. Sugar-sweetened drinks such as sodas, lemonade, and fruit drinks. Fats and oils  Butter, stick margarine, lard, shortening, ghee, or bacon fat. Coconut, palm kernel, and palm oils. Sweets and desserts  Corn syrup, sugars, honey, and molasses. Candy. Jam and jelly. Syrup. Sweetened cereals. Cookies, pies, cakes, donuts, muffins, and ice cream. The items listed above may not be a complete list of foods and beverages you should avoid. Contact a dietitian for more information. Summary  Your body needs fat and cholesterol for basic functions. However, eating too much of these things can be harmful to your health.  Work with your health care provider and dietitian to follow a diet low in fat and cholesterol. Doing this may help lower your risk for heart disease and other conditions.  Choose healthy fats, such as monounsaturated and polyunsaturated fats, and foods high in omega-3 fatty acids.  Eat fiber-rich foods, such as whole grains, beans, peas, fruits, and vegetables.  Limit or avoid alcohol, fried foods, and foods high in saturated fats, partially hydrogenated oils, and sugar. This information is not intended to replace advice given to you by your health care provider. Make sure you discuss any questions you have with your health care  provider. Document Revised: 05/17/2020 Document Reviewed: 01/19/2020 Elsevier Patient Education  2021 [place] protect organs, store calcium, anchor muscles, and support the whole body. Keeping your bones strong is important, especially as you get older. You can take actions to help keep your bones strong and healthy. Why is keeping my bones healthy important? Keeping your bones healthy is important because your body constantly replaces bone cells. Cells get old, and new cells take their place. As we age, we lose bone cells because the body may not be able to make enough new cells to replace the old cells. The amount of bone cells and bone tissue you have is referred to as bone mass. The higher your bone mass, the stronger your bones. The aging process leads to an overall loss of bone mass in the body, which can increase the likelihood of:  Joint pain and stiffness.  Broken bones.  A condition in which the bones become weak and brittle (osteoporosis). A large decline in bone mass occurs in older adults. In women, it occurs about the time of menopause.   What actions can I take to keep my bones healthy? Good health habits are important for maintaining healthy bones. This includes eating nutritious foods and exercising regularly. To have healthy bones, you need to get enough of the right minerals and vitamins. Most nutrition experts recommend getting these nutrients from the foods that you eat. In some cases, taking supplements may also be recommended. Doing certain types  of exercise is also important for bone health. What are the nutritional recommendations for healthy bones? Eating a well-balanced diet with plenty of calcium and vitamin D will help to protect your bones. Nutritional recommendations vary from person to person. Ask your health care provider what is healthy for you. Here are some general guidelines. Get enough calcium Calcium is the most important  (essential) mineral for bone health. Most people can get enough calcium from their diet, but supplements may be recommended for people who are at risk for osteoporosis. Good sources of calcium include:  Dairy products, such as low-fat or nonfat milk, cheese, and yogurt.  Dark green leafy vegetables, such as bok choy and broccoli.  Calcium-fortified foods, such as orange juice, cereal, bread, soy beverages, and tofu products.  Nuts, such as almonds. Follow these recommended amounts for daily calcium intake:  Children, age 24-3: 700 mg.  Children, age 67-8: 1,000 mg.  Children, age 39-13: 1,300 mg.  Teens, age 677-18: 1,300 mg.  Adults, age 87-50: 1,000 mg.  Adults, age 247-70: ? Men: 1,000 mg. ? Women: 1,200 mg.  Adults, age 5 or older: 1,200 mg.  Pregnant and breastfeeding females: ? Teens: 1,300 mg. ? Adults: 1,000 mg. Get enough vitamin D Vitamin D is the most essential vitamin for bone health. It helps the body absorb calcium. Sunlight stimulates the skin to make vitamin D, so be sure to get enough sunlight. If you live in a cold climate or you do not get outside often, your health care provider may recommend that you take vitamin D supplements. Good sources of vitamin D in your diet include:  Egg yolks.  Saltwater fish.  Milk and cereal fortified with vitamin D. Follow these recommended amounts for daily vitamin D intake:  Children and teens, age 24-18: 600 international units.  Adults, age 70 or younger: 400-800 international units.  Adults, age 24 or older: 800-1,000 international units. Get other important nutrients Other nutrients that are important for bone health include:  Phosphorus. This mineral is found in meat, poultry, dairy foods, nuts, and legumes. The recommended daily intake for adult men and adult women is 700 mg.  Magnesium. This mineral is found in seeds, nuts, dark green vegetables, and legumes. The recommended daily intake for adult men is 400-420  mg. For adult women, it is 310-320 mg.  Vitamin K. This vitamin is found in green leafy vegetables. The recommended daily intake is 120 mg for adult men and 90 mg for adult women.   What type of physical activity is best for building and maintaining healthy bones? Weight-bearing and strength-building activities are important for building and maintaining healthy bones. Weight-bearing activities cause muscles and bones to work against gravity. Strength-building activities increase the strength of the muscles that support bones. Weight-bearing and muscle-building activities include:  Walking and hiking.  Jogging and running.  Dancing.  Gym exercises.  Lifting weights.  Tennis and racquetball.  Climbing stairs.  Aerobics. Adults should get at least 30 minutes of moderate physical activity on most days. Children should get at least 60 minutes of moderate physical activity on most days. Ask your health care provider what type of exercise is best for you.   How can I find out if my bone mass is low? Bone mass can be measured with an X-ray test called a bone mineral density (BMD) test. This test is recommended for all women who are age 678 or older. It may also be recommended for:  Men who are age  10 or older.  People who are at risk for osteoporosis because of: ? Having bones that break easily. ? Having a long-term disease that weakens bones, such as kidney disease or rheumatoid arthritis. ? Having menopause earlier than normal. ? Taking medicine that weakens bones, such as steroids, thyroid hormones, or hormone treatment for breast cancer or prostate cancer. ? Smoking. ? Drinking three or more alcoholic drinks a day. If you find that you have a low bone mass, you may be able to prevent osteoporosis or further bone loss by changing your diet and lifestyle. Where can I find more information? For more information, check out the following websites:  [place]:  AviationTales.fr  [employer] of Health: www.bones.SouthExposed.es  International Osteoporosis Foundation: Administrator.iofbonehealth.org Summary  The aging process leads to an overall loss of bone mass in the body, which can increase the likelihood of broken bones and osteoporosis.  Eating a well-balanced diet with plenty of calcium and vitamin D will help to protect your bones.  Weight-bearing and strength-building activities are also important for building and maintaining strong bones.  Bone mass can be measured with an X-ray test called a bone mineral density (BMD) test. This information is not intended to replace advice given to you by your health care provider. Make sure you discuss any questions you have with your health care provider. Document Revised: 10/13/2017 Document Reviewed: 10/13/2017 Elsevier Patient Education  2021 [employer].

## 2020-11-27 ENCOUNTER — Other Ambulatory Visit: Payer: PPO

## 2020-12-05 DIAGNOSIS — M170 Bilateral primary osteoarthritis of knee: Secondary | ICD-10-CM | POA: Diagnosis not present

## 2021-01-01 ENCOUNTER — Other Ambulatory Visit: Payer: Self-pay | Admitting: *Deleted

## 2021-01-01 MED ORDER — ELIQUIS 5 MG PO TABS: 5.0000 mg | ORAL_TABLET | Freq: Two times a day (BID) | ORAL | 5 refills | Status: DC

## 2021-01-01 NOTE — Telephone Encounter (Signed)
Eliquis 5mg  paper refill request received. Patient is 80 years old, weight-58.7kg, Crea-0.64 on 06/22/2020, Diagnosis-Afib, and last seen by Dr. Johney Frame on 10/31/2020. Dose is appropriate based on dosing criteria. Will send in refill to requested pharmacy.

## 2021-01-02 ENCOUNTER — Telehealth: Payer: Self-pay | Admitting: Pharmacist

## 2021-01-02 MED ORDER — PANTOPRAZOLE SODIUM 40 MG PO TBEC: 40.0000 mg | DELAYED_RELEASE_TABLET | Freq: Every day | ORAL | 2 refills | Status: DC

## 2021-01-02 NOTE — Telephone Encounter (Signed)
Switching from Nexium to pantoprazole for insurance purposes

## 2021-01-02 NOTE — Telephone Encounter (Signed)
Pt made aware to discontinue her esomeprazole and our Pharmacist Gerald Stabs and Dr. Johney Frame called her in a new regimen pantoprazole 40 mg po daily to her pharmacy, to avoid her going into the doughnut hole.  Pt made aware of this via mychart message.  Advised her to message back with any additional questions.

## 2021-01-09 DIAGNOSIS — Z7189 Other specified counseling: Secondary | ICD-10-CM

## 2021-01-09 NOTE — Telephone Encounter (Signed)
Please do referral as requested

## 2021-01-10 ENCOUNTER — Other Ambulatory Visit: Payer: Self-pay

## 2021-01-10 DIAGNOSIS — Z7189 Other specified counseling: Secondary | ICD-10-CM

## 2021-01-10 DIAGNOSIS — Z0110 Encounter for examination of ears and hearing without abnormal findings: Secondary | ICD-10-CM

## 2021-01-22 DIAGNOSIS — H903 Sensorineural hearing loss, bilateral: Secondary | ICD-10-CM | POA: Diagnosis not present

## 2021-01-25 ENCOUNTER — Other Ambulatory Visit: Payer: Self-pay | Admitting: Internal Medicine

## 2021-02-06 ENCOUNTER — Telehealth: Payer: Self-pay | Admitting: Pharmacist

## 2021-02-11 NOTE — Chronic Care Management (AMB) (Signed)
Chronic Care Management Pharmacy Assistant   Name: Crystal Odom  MRN: [id number] DOB: 15-Jul-1941  Reason for Encounter: Disease State   Conditions to be addressed/monitored: HTN   Recent office visits:  None  Recent consult visits:  . 04.05.2022 Rollen Sox, [facility] Pharmacist- Medication change: She went from Esomeprazole magnesium 40 mg to Pantoprazole sodium mg due to insurance . 03.08.2022 Elsie Saas Orthopedic Surgery patient seen for follow-up with injects.  Hospital visits:  None in previous 6 months  Medications: Outpatient Encounter Medications as of 02/06/2021  Medication Sig  . apixaban (ELIQUIS) 5 MG TABS tablet Take 1 tablet (5 mg total) by mouth 2 (two) times daily.  . cholecalciferol (VITAMIN D3) 25 MCG (1000 UNIT) tablet Take 2,000 Units by mouth daily.   Marland Kitchen levothyroxine (SYNTHROID) 88 MCG tablet TAKE 1 TABLET BY MOUTH EVERY DAY  . metoprolol succinate (TOPROL XL) 25 MG 24 hr tablet Take 0.5 tablets (12.5 mg total) by mouth 2 (two) times daily.  . pantoprazole (PROTONIX) 40 MG tablet Take 1 tablet (40 mg total) by mouth daily.   No facility-administered encounter medications on file as of 02/06/2021.   Reviewed chart prior to disease state call. Spoke with patient regarding BP  Recent Office Vitals: BP Readings from Last 3 Encounters:  10/31/20 130/90  07/25/20 (!) 142/80  07/07/20 (!) 149/87   Pulse Readings from Last 3 Encounters:  10/31/20 (!) 116  07/25/20 (!) 58  07/07/20 70    Wt Readings from Last 3 Encounters:  10/31/20 129 lb 6.4 oz (58.7 kg)  07/25/20 127 lb (57.6 kg)  07/07/20 128 lb 9.6 oz (58.3 kg)     Kidney Function Lab Results  Component Value Date/Time   CREATININE 0.64 06/22/2020 07:39 AM   CREATININE 0.66 06/04/2019 09:06 AM   CREATININE 0.72 06/02/2018 08:36 AM   GFR 87.07 06/04/2019 09:06 AM   GFRNONAA 86 06/22/2020 07:39 AM   GFRAA 100 06/22/2020 07:39 AM    BMP Latest Ref Rng & Units 06/22/2020  06/04/2019 06/02/2018  Glucose 65 - 99 mg/dL 83 82 91  BUN 7 - 25 mg/dL 18 19 21   Creatinine 0.60 - 0.93 mg/dL 0.64 0.66 0.72  BUN/Creat Ratio 6 - 22 (calc) NOT APPLICABLE - -  Sodium 275 - 146 mmol/L 140 139 139  Potassium 3.5 - 5.3 mmol/L 4.8 4.7 4.5  Chloride 98 - 110 mmol/L 103 103 102  CO2 20 - 32 mmol/L 32 30 29  Calcium 8.6 - 10.4 mg/dL 9.7 9.6 9.7   . Current antihypertensive regimen:  o Metoprolol Succinate 25 TZ:GYFV 0.5 tablets by mouth twice daily . How often are you checking your Blood Pressure? 1-2x per week . Current home BP readings:  o 05.04 110/70 o 05.07 108/70 o 05.08 110/72 o 05.10 112/74 . What recent interventions/DTPs have been made by any provider to improve Blood Pressure control since last CPP Visit: None . Any recent hospitalizations or ED visits since last visit with CPP? No . What diet changes have been made to improve Blood Pressure Control?  o No Change . What exercise is being done to improve your Blood Pressure Control?  o She does Yoga class 2x a week and Silver sneakers two days as well  Adherence Review: Is the patient currently on ACE/ARB medication? No Does the patient have >5 day gap between last estimated fill dates? No I spoke with the patient and discuss medication adherence. She stated that she has been doing well. She continues  to take her blood pressure 2 to 3 times a week. No current changes to her medications. No recent falls or injuries. She states she does a Yoga class twice a week and silver sneakers 2 to 3 days as well. No change in her diet. She states that she is not experiencing any side effects from her current medications. There have been no emergency department or urgent care visits since her last CPP or PCP visit. She currently does not have any issues with her pharmacy. Here next CCM appointment is et for August 2022.  Star Rating Drugs: None  Arlis Porta Revonda Standard, [place] [phone]

## 2021-04-17 DIAGNOSIS — S83241A Other tear of medial meniscus, current injury, right knee, initial encounter: Secondary | ICD-10-CM | POA: Diagnosis not present

## 2021-04-17 DIAGNOSIS — M2242 Chondromalacia patellae, left knee: Secondary | ICD-10-CM | POA: Diagnosis not present

## 2021-04-17 DIAGNOSIS — M2241 Chondromalacia patellae, right knee: Secondary | ICD-10-CM | POA: Diagnosis not present

## 2021-04-20 DIAGNOSIS — M25561 Pain in right knee: Secondary | ICD-10-CM | POA: Diagnosis not present

## 2021-04-22 ENCOUNTER — Other Ambulatory Visit: Payer: Self-pay | Admitting: Internal Medicine

## 2021-04-27 NOTE — Progress Notes (Deleted)
Cardiology Office Note:    Date:  04/27/2021   ID:  Crystal Odom, DOB 22-Dec-1940, MRN [id number]  PCP:  Burnis Medin, MD  [facility] Cardiologist:  Freada Bergeron, MD  [facility] Electrophysiologist:  None   Referring MD: Burnis Medin, MD    History of Present Illness:    Crystal Odom is a 80 y.o. female with a hx of HTN, mild AS and HLD who was recently diagnosed with Afib on cardiac monitor after presenting with palpitations who now presents to clinic for follow-up.  During visit on 07/07/20, the patient was having episodes of palpitations. We placed a cardiac monitor which showed episodes of Afib. We started her on apixaban at the time.  Last seen in clinic on 10/31/20 where she was doing well. Having brief episodes of mildly symptomatic Afib. Was tolerating apixaban without issues at that time.   Today,  Cardiac CT calcium score was 0 in 2017. She remains active and walks daily for exercise. No limitations.   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 neutropenia   Arthritis    left knee   Diplopia 2002   eval neg   Hyperlipidemia    HDL over 100   Hypothyroidism    Kidney stones    in past   Leukopenia    with neg hemevaluation bm bx get wbc diff q 6 months   UTI (lower urinary tract infection)     Past Surgical History:  Procedure Laterality Date   BONE MARROW BIOPSY     low WBC   CATARACT EXTRACTION, BILATERAL     kidney stone removal     left arthroscopic  2005   left knee   LIVER BIOPSY     reported normal   TONSILLECTOMY     TUBAL LIGATION      Current Medications: No outpatient medications have been marked as taking for the 05/03/21 encounter (Appointment) with Freada Bergeron, MD.     Allergies:   Penicillins   Social History   Socioeconomic History   Marital status: Married    Spouse name: Not on file   Number of children: Not on file    Years of education: Not on file   Highest education level: Not on file  Occupational History   Not on file  Tobacco Use   Smoking status: Never   Smokeless tobacco: Never  Vaping Use   Vaping Use: Never used  Substance and Sexual Activity   Alcohol use: Yes    Alcohol/week: 2.0 - 3.0 standard drinks    Types: 2 - 3 Glasses of wine per week    Comment: 2 to 3 glasses a week    Drug use: No   Sexual activity: Not on file  Other Topics Concern   Not on file  Social History Narrative   Married   Retired  2008   Regular exercise-yes   HH of 2   No pets   Social etoh   Buna CABG 2012   Travel grandchildren    Social Determinants of Health   Financial Resource Strain: Low Risk    Difficulty of Paying Living Expenses: Not hard at all  Food Insecurity: No Food Insecurity   Worried About Charity fundraiser in the Last Year: Never true   [place] in the Last Year: Never true  Transportation Needs: No Data processing manager (Medical): No   Lack of Transportation (Non-Medical): No  Physical Activity: Sufficiently Active   Days of Exercise per Week: 7 days   Minutes of Exercise per Session: 30 min  Stress: No Stress Concern Present   Feeling of Stress : Not at all  Social Connections: Socially Integrated   Frequency of Communication with Friends and Family: More than three times a week   Frequency of Social Gatherings with Friends and Family: Twice a week   Attends Religious Services: More than 4 times per year   Active Member of [employer] or Organizations: Yes   Attends Music therapist: More than 4 times per year   Marital Status: Married     Family History: The patient's family history includes Autoimmune disease in her brother and sister; Heart disease in her father and mother; Osteoarthritis in her father; Osteoporosis in an other family member; Other in her father; Stroke in her maternal grandmother and another  family member; Thyroid disease in an other family member.  ROS:   Please see the history of present illness.    Review of Systems  Constitutional:  Negative for chills and fever.  HENT:  Negative for nosebleeds.   Eyes:  Negative for blurred vision.  Respiratory:  Negative for shortness of breath.   Cardiovascular:  Positive for palpitations. Negative for chest pain, orthopnea, claudication, leg swelling and PND.  Gastrointestinal:  Negative for melena, nausea and vomiting.  Genitourinary:  Negative for dysuria and hematuria.  Musculoskeletal:  Negative for falls.  Neurological:  Negative for dizziness and loss of consciousness.  Endo/Heme/Allergies:  Negative for polydipsia.  Psychiatric/Behavioral:  Negative for substance abuse. The patient is not nervous/anxious.    EKGs/Labs/Other Studies Reviewed:    The following studies were reviewed today: 08/01/20 Cardiac Monitor: Predominant rhythm is sinus with average HR 62bpm. HR range 38bpm to 220bpm. There were 59 episodes of SVT with fastest 179bpm lasting 7 beats. Longest episode lasted 15.6 secs with average HR 101bpm. There were several runs of Afib (<1% burden) with longest episode lasting 53min and 10sec with average HR 178. Patient symptomatic at that time. Isolated PACs and PVCs were rare (<1% burden). No NSVT, VT or significant pauses.  TTE 06/11/19: IMPRESSIONS   1. The left ventricle has hyperdynamic systolic function, with an  ejection fraction of >65%. The cavity size was normal. There is mild  eccentric left ventricular hypertrophy. Left ventricular diastolic Doppler  parameters are indeterminate. The E/e' is  9. No evidence of left ventricular regional wall motion abnormalities.   2. The right ventricle has normal systolic function. The cavity was  normal. There is no increase in right ventricular wall thickness. Right  ventricular systolic pressure is normal.   3. There is systolic bowing of the mitral leaflets,  without true  prolapse.   4. The aortic valve is tricuspid. Mild thickening of the aortic valve.  Sclerosis without any evidence of stenosis of the aortic valve. Mild  stenosis of the aortic valve.   5. Focal calcification of noncoronary cusp.   6. The aorta is normal unless otherwise noted.   7. The aortic root, ascending aorta and aortic arch are normal in size  and structure.   EKG:  EKG is  ordered today.  The ekg ordered today demonstrates Afib with HR 116, mild STD in V4-V6, II and aVF  Recent Labs: 06/22/2020: ALT 20; BUN 18; Creat 0.64; Potassium 4.8; Sodium 140;  TSH 1.68 10/31/2020: Hemoglobin 14.2; Platelets 245  Recent Lipid Panel    Component Value Date/Time   CHOL 239 (H) 10/31/2020 0856   TRIG 61 10/31/2020 0856   HDL 118 10/31/2020 0856   CHOLHDL 2.0 10/31/2020 0856   CHOLHDL 2.8 06/22/2020 0739   VLDL 12.0 06/04/2019 0906   LDLCALC 111 (H) 10/31/2020 0856   LDLCALC 161 (H) 06/22/2020 0739   LDLDIRECT 149.6 02/02/2013 1003     Risk Assessment/Calculations:    CHA2DS2-VASc Score = 4  {This indicates a 4.8% annual risk of stroke. The patient's score is based upon: CHF History: No HTN History: Yes Diabetes History: No Stroke History: No Vascular Disease History: No Age Score: 2 Gender Score: 1    Physical Exam:    VS:  There were no vitals taken for this visit.    Wt Readings from Last 3 Encounters:  10/31/20 129 lb 6.4 oz (58.7 kg)  07/25/20 127 lb (57.6 kg)  07/07/20 128 lb 9.6 oz (58.3 kg)     GEN:  Well nourished, well developed in no acute distress HEENT: Normal NECK: No JVD; No carotid bruits CARDIAC: Irregularly irregular, no murmurs, rubs, gallops RESPIRATORY:  Clear to auscultation without rales, wheezing or rhonchi  ABDOMEN: Soft, non-tender, non-distended MUSCULOSKELETAL:  No edema; No deformity  SKIN: Warm and dry NEUROLOGIC:  Alert and oriented x 3 PSYCHIATRIC:  Normal affect   ASSESSMENT:    No diagnosis found.  PLAN:    In  order of problems listed above:  #Newly Diagnosed Paroxysmal Afib Zio patch placed for palpitations revealed runs of Afib. Started on [facility] with apixaban and metop for rate control. Continues to have runs of Afib at home with mild symptoms of palpitations. No lightheadedness, dizziness, chest pain or SOB. Currently in Afib today with HR 116. -Continue apixaban 5mg  BID -Continue metoprolol 12.5mg  BID (only take if SBP>100) -Continue nexium daily for gastric protection -If maintaining HR >120 for >2 hours, can take an additional 12.5mg  of metop -Discussed that if she becomes symptomatic with worsening SOB, lightheadedness, dizziness, chest pain or hypotension--needs to go to ER to be evaluated -TTE in 2020 with normal LVEF, mild AS, no other significant valvular disease  #Orthostasis: #Lightheadedness: Resolved with stopping the lisinopril.  -TTE 06/2019 with normal BiV function, mild LVH, mild AS -Stopped lisinopril -Counseled about adequate fluid and salt intake -Compression therapy with stockings and/or abdominal binder -Counseled about countermaneuvers including laying down with her legs up if she feels like she is going to pass out   #Elevated LDL: LDL 161, HDL 101. ASCVD risk score 15.2%. Coronary calcium score in 2017 0.  -Continue Crestor 5mg  PO daily   #HTN: Well controlled. -Continue metop for rate control as above -Off lisinopril due to orthostasis symptoms   Medication Adjustments/Labs and Tests Ordered: Current medicines are reviewed at length with the patient today.  Concerns regarding medicines are outlined above.  No orders of the defined types were placed in this encounter.  No orders of the defined types were placed in this encounter.   There are no Patient Instructions on file for this visit.    Signed, Freada Bergeron, MD  04/27/2021 7:48 AM    [facility]

## 2021-05-03 ENCOUNTER — Ambulatory Visit: Payer: PPO | Admitting: Cardiology

## 2021-05-07 ENCOUNTER — Telehealth: Payer: Self-pay | Admitting: Pharmacist

## 2021-05-07 NOTE — Chronic Care Management (AMB) (Signed)
Chronic Care Management Pharmacy Assistant   Name: Crystal Odom  MRN: [id number] DOB: [birth year]  Reason for Encounter: Disease State/ Hypertension Assessment Call.    Conditions to be addressed/monitored: HTN  Recent office visits:  None.   Recent consult visits:  12/05/20 Crystal Saas MD (Orthopedic Surgery) - seen for bilateral primary osteoarthritis of knee. Patient received triamcinolone injection in office. No medication changes or follow up noted.   Hospital visits:  None in previous 6 months  Medications: Outpatient Encounter Medications as of 05/07/2021  Medication Sig   apixaban (ELIQUIS) 5 MG TABS tablet Take 1 tablet (5 mg total) by mouth 2 (two) times daily.   cholecalciferol (VITAMIN D3) 25 MCG (1000 UNIT) tablet Take 2,000 Units by mouth daily.    levothyroxine (SYNTHROID) 88 MCG tablet TAKE 1 TABLET BY MOUTH EVERY DAY   metoprolol succinate (TOPROL XL) 25 MG 24 hr tablet Take 0.5 tablets (12.5 mg total) by mouth 2 (two) times daily.   pantoprazole (PROTONIX) 40 MG tablet Take 1 tablet (40 mg total) by mouth daily.   rosuvastatin (CRESTOR) 5 MG tablet Take 1 tablet (5 mg total) by mouth daily.   No facility-administered encounter medications on file as of 05/07/2021.   Fill History: ELIQUIS '5MG'$  TABLET 04/17/2021 30   ESOMEPRAZOLE MAG DR 40 MG CAP 10/31/2020 90   LEVOTHYROXINE 88 MCG TABLET 04/23/2021 90   METOPROLOL SUCCINATE ER '25MG'$  TABLET EXTENDED RELEASE 24 HOUR 03/29/2021 90   PANTOPRAZOLE SOD DR 40 MG TAB 04/03/2021 90   ROSUVASTATIN CALCIUM 5 MG TAB 03/15/2021 90   Reviewed chart prior to disease state call. Spoke with patient regarding BP  Recent Office Vitals: BP Readings from Last 3 Encounters:  10/31/20 130/90  07/25/20 (!) 142/80  07/07/20 (!) 149/87   Pulse Readings from Last 3 Encounters:  10/31/20 (!) 116  07/25/20 (!) 58  07/07/20 70    Wt Readings from Last 3 Encounters:  10/31/20 129 lb 6.4 oz (58.7 kg)  07/25/20 127 lb  (57.6 kg)  07/07/20 128 lb 9.6 oz (58.3 kg)     Kidney Function Lab Results  Component Value Date/Time   CREATININE 0.64 06/22/2020 07:39 AM   CREATININE 0.66 06/04/2019 09:06 AM   CREATININE 0.72 06/02/2018 08:36 AM   GFR 87.07 06/04/2019 09:06 AM   GFRNONAA 86 06/22/2020 07:39 AM   GFRAA 100 06/22/2020 07:39 AM    BMP Latest Ref Rng & Units 06/22/2020 06/04/2019 06/02/2018  Glucose 65 - 99 mg/dL 83 82 91  BUN 7 - 25 mg/dL '18 19 21  '$ Creatinine 0.60 - 0.93 mg/dL 0.64 0.66 0.72  BUN/Creat Ratio 6 - 22 (calc) NOT APPLICABLE - -  Sodium [id number] - 146 mmol/L 140 139 139  Potassium 3.5 - 5.3 mmol/L 4.8 4.7 4.5  Chloride 98 - 110 mmol/L 103 103 102  CO2 20 - 32 mmol/L 32 30 29  Calcium 8.6 - 10.4 mg/dL 9.7 9.6 9.7    Current antihypertensive regimen:  Metoprolol '25mg'$  - take 0.5 tablets by mouth twice daily. How often are you checking your Blood Pressure? 1-2x per week Current home BP readings: ranging [id number] systolic to [id number] range diastolic. Patient does not have her log with her currently while visiting her daughter.  What recent interventions/DTPs have been made by any provider to improve Blood Pressure control since last CPP Visit: None.  Any recent hospitalizations or ED visits since last visit with CPP? No  Adherence Review: Is the patient currently on ACE/ARB medication?  No Does the patient have >5 day gap between last estimated fill dates? No  Notes: Spoke with patient and reviewed all medications as listed. Patient reports taking all medications as prescribed and no issues at this time. Patient os currently visiting her daughter and did not bring her blood pressure cuff or her log with her. Patient reported her blood pressure ranges as above. Patient stated for breakfast she tends to have things like yogurt and some fruit and about twice a week she has eggs and toast. Patient states she does have coffee in the mornings. For lunch patient usually has a sandwich and soup or a salad  with some soup. Patient stated her and her husband have red meat about twice a week and they do add salt just a little to their food. Patient states they eat dinner at home mostly and its usually chicken or fish with vegetables and potatoes here and there. They have vegetables like broccoli and asparagus or collard greens, squash or zucchini. Patient states she likes to get about two servings of whole grains a week as well and she drinks about 5-6 glasses of water a day. Patient stated for activity her and her husband walk 30 minutes every morning. They do silver sneaker three days a week and yoga two days a week. Patient also does things like clean her house and cook. Patient thanked me for my call and patient was rescheduled for he follow up in October on 07/19/21 at 3pm via telephone.   Care Gaps:  AWV - completed 11/07/20 Hepatitis C screening - never done Influenza vaccine - due since 04/30/21 Covid-19 vaccine booster 5 - due on 05/11/21   Star Rating Drugs:  Rosuvastatin '5mg'$  - last filled on 03/15/21 90DS at [place] [phone]

## 2021-05-09 NOTE — Telephone Encounter (Cosign Needed)
2nd attempt

## 2021-05-15 ENCOUNTER — Telehealth: Payer: PPO

## 2021-05-23 DIAGNOSIS — D1801 Hemangioma of skin and subcutaneous tissue: Secondary | ICD-10-CM | POA: Diagnosis not present

## 2021-05-23 DIAGNOSIS — D225 Melanocytic nevi of trunk: Secondary | ICD-10-CM | POA: Diagnosis not present

## 2021-05-23 DIAGNOSIS — L812 Freckles: Secondary | ICD-10-CM | POA: Diagnosis not present

## 2021-05-23 DIAGNOSIS — D2371 Other benign neoplasm of skin of right lower limb, including hip: Secondary | ICD-10-CM | POA: Diagnosis not present

## 2021-05-23 DIAGNOSIS — L821 Other seborrheic keratosis: Secondary | ICD-10-CM | POA: Diagnosis not present

## 2021-06-07 ENCOUNTER — Other Ambulatory Visit: Payer: Self-pay

## 2021-06-07 ENCOUNTER — Encounter (HOSPITAL_BASED_OUTPATIENT_CLINIC_OR_DEPARTMENT_OTHER): Payer: Self-pay | Admitting: Family

## 2021-06-07 ENCOUNTER — Ambulatory Visit (HOSPITAL_BASED_OUTPATIENT_CLINIC_OR_DEPARTMENT_OTHER): Payer: PPO | Admitting: Family

## 2021-06-07 VITALS — BP 140/90 | HR 58 | Ht 64.5 in | Wt 131.0 lb

## 2021-06-07 DIAGNOSIS — I350 Nonrheumatic aortic (valve) stenosis: Secondary | ICD-10-CM | POA: Diagnosis not present

## 2021-06-07 DIAGNOSIS — Z7901 Long term (current) use of anticoagulants: Secondary | ICD-10-CM | POA: Diagnosis not present

## 2021-06-07 DIAGNOSIS — I480 Paroxysmal atrial fibrillation: Secondary | ICD-10-CM

## 2021-06-07 MED ORDER — APIXABAN 5 MG PO TABS: 5.0000 mg | ORAL_TABLET | Freq: Two times a day (BID) | ORAL | 5 refills | Status: DC

## 2021-06-07 MED ORDER — ROSUVASTATIN CALCIUM 5 MG PO TABS: 5.0000 mg | ORAL_TABLET | Freq: Every day | ORAL | 3 refills | Status: DC

## 2021-06-07 NOTE — Patient Instructions (Addendum)
Medication Instructions:  Continue your current medications.   *If you need a refill on your cardiac medications before your next appointment, please call your pharmacy*  Lab Work: None ordered today.   Testing/Procedures: Your EKG today showed sinus bradycardia 58bpm which is a slow but regular heart rhythm.    Follow-Up: At [facility], you and your health needs are our priority.  As part of our continuing mission to provide you with exceptional heart care, we have created designated Provider Care Teams.  These Care Teams include your primary Cardiologist (physician) and Advanced Practice Providers (APPs -  Physician Assistants and Nurse Practitioners) who all work together to provide you with the care you need, when you need it.  We recommend signing up for the patient portal called "MyChart".  Sign up information is provided on this After Visit Summary.  MyChart is used to connect with patients for Virtual Visits (Telemedicine).  Patients are able to view lab/test results, encounter notes, upcoming appointments, etc.  Non-urgent messages can be sent to your provider as well.   To learn more about what you can do with MyChart, go to [email or web address].    Your next appointment:   6 month(s)  The format for your next appointment:   In Person  Provider:   You may see Freada Bergeron, MD or one of the following Advanced Practice Providers on your designated Care Team:   Richardson Dopp, PA-C [place], [place] Loel Dubonnet, NP    Other Instructions  Heart Healthy Diet Recommendations: A low-salt diet is recommended. Meats should be grilled, baked, or boiled. Avoid fried foods. Focus on lean protein sources like fish or chicken with vegetables and fruits. The American Heart Association is a Microbiologist!    Exercise recommendations: The American Heart Association recommends 150 minutes of moderate intensity exercise weekly. Try 30 minutes of moderate intensity  exercise 4-5 times per week. This could include walking, jogging, or swimming.

## 2021-06-07 NOTE — Progress Notes (Signed)
Office Visit    Patient Name: Crystal Odom Date of Encounter: 06/07/2021  PCP:  Burnis Medin, MD   [place]  Cardiologist:  Freada Bergeron, MD  Advanced Practice Provider:  No care team member to display Electrophysiologist:  None     Chief Complaint    Crystal Odom is a 80 y.o. female with a hx of HTN, mild AS, HLD, PAF, chronic anticoagulation presents today for follow up of PAF   Past Medical History    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 neutropenia   Arthritis    left knee   Diplopia 2002   eval neg   Hyperlipidemia    HDL over 100   Hypothyroidism    Kidney stones    in past   Leukopenia    with neg hemevaluation bm bx get wbc diff q 6 months   UTI (lower urinary tract infection)    Past Surgical History:  Procedure Laterality Date   BONE MARROW BIOPSY     low WBC   CATARACT EXTRACTION, BILATERAL     kidney stone removal     left arthroscopic  2005   left knee   LIVER BIOPSY     reported normal   TONSILLECTOMY     TUBAL LIGATION      Allergies  Allergies  Allergen Reactions   Penicillins     hives    History of Present Illness    Crystal Odom is a 80 y.o. female with a hx of HTN, mild AS, HLD, PAF, chronic anticoagulation last seen 10/31/20 by Dr. Johney Frame. .  Previous CT calcium score of 0 in 2017.  Most recent echocardiogram 06/11/2019 with normal LVEF greater than 65%, indeterminate diastolic parameters, no R WMA, RV normal size and function, mild aortic stenosis.  Evaluated October 2021 due to palpitations.  Cardiac monitor was placed which showed episodes of atrial fibrillation.  She was started on Eliquis at that time.  She presents today for follow-up. Reports no shortness of breath nor dyspnea on exertion. Reports no chest pain, pressure, or tightness. No edema, orthopnea, PND. She reports very rare episode  of atrial fibrillation that are overall fleeting and not bothersome. Tells me the last one she had was 2 months ago. Tolerating Eliquis without bleeding complication.   EKGs/Labs/Other Studies Reviewed:   The following studies were reviewed today:   EKG:  EKG is  ordered today.  The ekg ordered today demonstrates SB 58 bpm with 1st degree AV block (PR 226) with no acute ST/T wave changes.   Recent Labs: 06/22/2020: ALT 20; BUN 18; Creat 0.64; Potassium 4.8; Sodium 140; TSH 1.68 10/31/2020: Hemoglobin 14.2; Platelets 245  Recent Lipid Panel    Component Value Date/Time   CHOL 239 (H) 10/31/2020 0856   TRIG 61 10/31/2020 0856   HDL 118 10/31/2020 0856   CHOLHDL 2.0 10/31/2020 0856   CHOLHDL 2.8 06/22/2020 0739   VLDL 12.0 06/04/2019 0906   LDLCALC 111 (H) 10/31/2020 0856   LDLCALC 161 (H) 06/22/2020 0739   LDLDIRECT 149.6 02/02/2013 1003    Risk Assessment/Calculations:   CHA2DS2-VASc Score = 4   This indicates a 4.8% annual risk of stroke. The patient's score is based upon: CHF History: 0 HTN History: 1 Diabetes History: 0 Stroke History: 0 Vascular Disease History: 0 Age Score: 2 Gender Score: 1   Home Medications  Current Meds  Medication Sig   apixaban (ELIQUIS) 5 MG TABS tablet Take 1 tablet (5 mg total) by mouth 2 (two) times daily.   cholecalciferol (VITAMIN D3) 25 MCG (1000 UNIT) tablet Take 2,000 Units by mouth daily.    levothyroxine (SYNTHROID) 88 MCG tablet TAKE 1 TABLET BY MOUTH EVERY DAY   metoprolol succinate (TOPROL XL) 25 MG 24 hr tablet Take 0.5 tablets (12.5 mg total) by mouth 2 (two) times daily.   pantoprazole (PROTONIX) 40 MG tablet Take 1 tablet (40 mg total) by mouth daily.   rosuvastatin (CRESTOR) 5 MG tablet Take 1 tablet (5 mg total) by mouth daily.     Review of Systems      All other systems reviewed and are otherwise negative except as noted above.  Physical Exam    VS:  BP 140/90 (BP Location: Left Arm, Patient Position: Sitting,  Cuff Size: Normal)   Pulse (!) 58   Ht 5' 4.5" (1.638 m)   Wt 131 lb (59.4 kg)   SpO2 98%   BMI 22.14 kg/m  , BMI Body mass index is 22.14 kg/m.  Wt Readings from Last 3 Encounters:  06/07/21 131 lb (59.4 kg)  10/31/20 129 lb 6.4 oz (58.7 kg)  07/25/20 127 lb (57.6 kg)     GEN: Well nourished, well developed, in no acute distress. HEENT: normal. Neck: Supple, no JVD, carotid bruits, or masses. Cardiac: RRR, no murmurs, rubs, or gallops. No clubbing, cyanosis, edema.  Radials/PT 2+ and equal bilaterally.  Respiratory:  Respirations regular and unlabored, clear to auscultation bilaterally. GI: Soft, nontender, nondistended. MS: No deformity or atrophy. Skin: Warm and dry, no rash. Neuro:  Strength and sensation are intact. Psych: Normal affect.  Assessment & Plan    PAF - Maintaining SB. Asymptomatic with no lightheadedness, dizziness. Reports only rare palpitations. Continue Toprol 12.67m BID. Continue Eliquis 581mBID. Does not meet dose reduction criteria (though likely will in 2 years when she turns 89iven body habitus). Denies bleeding complications. 10/2020 CBC with no anemia and labs upcoming with PCP.  Mild aortic stenosis -noted by echo 06/2019.  Denies chest pain, shortness of breath, near syncope.  No appreciable murmur on exam.  Consider repeat echocardiogram as clinically indicated.  HLD - Continue Crestor 49m70maily. Labs upcoming with PCP.  HTN - BP well controlled. Continue current antihypertensive regimen.    Disposition: Follow up in 6 month(s) with Dr. PemJohney Frame APP.  Signed, CaiLoel DubonnetP 06/07/2021, 3:01 PM [place]

## 2021-06-12 ENCOUNTER — Telehealth: Payer: Self-pay | Admitting: Pharmacist

## 2021-06-12 NOTE — Chronic Care Management (AMB) (Signed)
    Chronic Care Management Pharmacy Assistant   Name: Crystal Odom  MRN: [id number] DOB: [birth year]  Called and spoke with patient to reschedule her upcoming October appointment per Jeni Salles the clinical pharmacist. Rescheduled patient to November. Patient aware and agreeable to new scheduled appointment. Patient thanked me for my call.  Medications: Outpatient Encounter Medications as of 06/12/2021  Medication Sig   apixaban (ELIQUIS) 5 MG TABS tablet Take 1 tablet (5 mg total) by mouth 2 (two) times daily.   cholecalciferol (VITAMIN D3) 25 MCG (1000 UNIT) tablet Take 2,000 Units by mouth daily.    levothyroxine (SYNTHROID) 88 MCG tablet TAKE 1 TABLET BY MOUTH EVERY DAY   metoprolol succinate (TOPROL XL) 25 MG 24 hr tablet Take 0.5 tablets (12.5 mg total) by mouth 2 (two) times daily.   pantoprazole (PROTONIX) 40 MG tablet Take 1 tablet (40 mg total) by mouth daily.   rosuvastatin (CRESTOR) 5 MG tablet Take 1 tablet (5 mg total) by mouth daily.   No facility-administered encounter medications on file as of 06/12/2021.    Care Gaps:  AWV - completed 11/07/20 Hepatitis C screening - never done Influenza vaccine - due since 04/30/21 Covid-19 vaccine booster 5 - due on 05/11/21    Star Rating Drugs:  Rosuvastatin '5mg'$  - last filled on 06/07/21 90DS at [facility] Pharmacist Assistant [phone]

## 2021-07-19 ENCOUNTER — Telehealth: Payer: PPO

## 2021-08-06 ENCOUNTER — Other Ambulatory Visit: Payer: Self-pay

## 2021-08-06 NOTE — Progress Notes (Signed)
Chief Complaint  Patient presents with   Annual Exam     HPI: Crystal Odom 80 y.o. comes in today for Preventive Medicare exam/ wellness visit .and medication check since last visit. Bp usually  lwoat home  below 130  rate 55 - 60 but higher in office visits   A fib  dx :about a year ago  rare symptoms  subtle .  Sees Dr Johney Frame.  On eliquis no falling bleeding syncope.  Is onMetoprolol.  Low dose   and protonxi for gi protection.  Hypothyroid autoimmune taking medication every day will need refills soon.  Crestor   Health Maintenance  Topic Date Due   Hepatitis C Screening  Never done   TETANUS/TDAP  02/03/2023   Pneumonia Vaccine 4+ Years old  Completed   INFLUENZA VACCINE  Completed   DEXA SCAN  Completed   COVID-19 Vaccine  Completed   Zoster Vaccines- Shingrix  Completed   HPV VACCINES  Aged Out   Health Maintenance Review LIFESTYLE:  Exercise: Walking Silver sneakers yoga Tobacco/ETS:n Alcohol: 1-3 per week  Sugar beverages:n Sleep:good 8 hours  Drug use: no HH:  Hearing: ok Vision:  No limitations at present . Last eye check UTD Safety:  Has smoke detector and wears seat belts.  No excess sun exposure. Sees dentist regularly. Falls: N Memory: Felt to be good  , no concern from her or her family. Depression: No anhedonia unusual crying or depressive symptoms Nutrition: Eats well balanced diet; adequate calcium and vitamin D. No swallowing chewing problems. Injury: no major injuries in the last six months. Other healthcare providers:  Reviewed today . Preventive parameters: up-to-date  Reviewed  ADLS:   There are no problems or need for assistance  driving, feeding, obtaining food, dressing, toileting and bathing, managing money using phone. She is independent.    ROS:  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  neutropenia   Arthritis    left knee   Diplopia 2002   eval neg   Hyperlipidemia    HDL over 100   Hypothyroidism    Kidney stones    in past   Leukopenia    with neg hemevaluation bm bx get wbc diff q 6 months   UTI (lower urinary tract infection)     Family History  Problem Relation Age of Onset   Other Father        major renal failure; SCCA in ear   Osteoarthritis Father    Heart disease Father    Heart disease Mother        63   Autoimmune disease Sister    Autoimmune disease Brother    Stroke Maternal Grandmother    Thyroid disease Other    Stroke Other    Osteoporosis Other     Social History   Socioeconomic History   Marital status: Married    Spouse name: Not on file   Number of children: Not on file   Years of education: Not on file   Highest education level: Not on file  Occupational History   Not on file  Tobacco Use   Smoking status: Never   Smokeless tobacco: Never  Vaping Use   Vaping Use: Never used  Substance and Sexual Activity   Alcohol use: Yes    Alcohol/week: 2.0 - 3.0 standard drinks  Types: 2 - 3 Glasses of wine per week    Comment: 2 to 3 glasses a week    Drug use: No   Sexual activity: Not on file  Other Topics Concern   Not on file  Social History Narrative   Married   Retired  2008   Regular exercise-yes   HH of 2   No pets   Social etoh   [employer] CABG 2012   Travel grandchildren    Social Determinants of Health   Financial Resource Strain: Low Risk    Difficulty of Paying Living Expenses: Not hard at all  Food Insecurity: No Food Insecurity   Worried About Charity fundraiser in the Last Year: Never true   Arboriculturist in the Last Year: Never true  Transportation Needs: No Transportation Needs   Lack of Transportation (Medical): No   Lack of Transportation (Non-Medical): No  Physical Activity: Sufficiently Active   Days of Exercise per Week: 7 days   Minutes of Exercise per Session: 30 min   Stress: No Stress Concern Present   Feeling of Stress : Not at all  Social Connections: Socially Integrated   Frequency of Communication with Friends and Family: More than three times a week   Frequency of Social Gatherings with Friends and Family: Twice a week   Attends Religious Services: More than 4 times per year   Active Member of [employer] or Organizations: Yes   Attends Archivist Meetings: More than 4 times per year   Marital Status: Married    Outpatient Encounter Medications as of 08/07/2021  Medication Sig   apixaban (ELIQUIS) 5 MG TABS tablet Take 1 tablet (5 mg total) by mouth 2 (two) times daily.   cholecalciferol (VITAMIN D3) 25 MCG (1000 UNIT) tablet Take 2,000 Units by mouth daily.    levothyroxine (SYNTHROID) 88 MCG tablet TAKE 1 TABLET BY MOUTH EVERY DAY   metoprolol succinate (TOPROL XL) 25 MG 24 hr tablet Take 0.5 tablets (12.5 mg total) by mouth 2 (two) times daily.   pantoprazole (PROTONIX) 40 MG tablet Take 1 tablet (40 mg total) by mouth daily.   rosuvastatin (CRESTOR) 5 MG tablet Take 1 tablet (5 mg total) by mouth daily.   No facility-administered encounter medications on file as of 08/07/2021.    EXAM:  BP 140/86 (BP Location: Left Arm, Patient Position: Sitting, Cuff Size: Normal)   Pulse 60   Temp 97.8 F (36.6 C) (Oral)   Ht 5\' 4"  (1.626 m)   Wt 127 lb 3.2 oz (57.7 kg)   SpO2 98%   BMI 21.83 kg/m   Body mass index is 21.83 kg/m.  Physical Exam: Vital signs reviewed [id number] is a well-developed well-nourished alert cooperative   who appears stated age in no acute distress.  HEENT: normocephalic atraumatic , Eyes: PERRL EOM's full, conjunctiva clear, Eac lear TMs with normal landmarks. Mouth: clear OP,masked  NECK: supple without masses, thyromegaly or bruits. CHEST/PULM:  Clear to auscultation and percussion breath sounds equal no wheeze , rales or rhonchi. No chest wall deformities or tenderness. Breast: normal by inspection . No  dimpling, discharge, masses, tenderness or discharge . CV: PMI is nondisplaced, S1 S2 no gallops, murmurs, rubs. Peripheral pulses are full without delay.No JVD .  ABDOMEN: Bowel sounds normal nontender  No guard or rebound, no hepato splenomegal no CVA tenderness.   Extremtities:  No clubbing cyanosis or edema, no acute joint swelling  or redness no focal atrophy NEURO:  Oriented x3, cranial nerves 3-12 appear to be intact, no obvious focal weakness,gait within normal limits no abnormal reflexes or asymmetrical SKIN: No acute rashes normal turgor, color, no bruising or petechiae. PSYCH: Oriented, good eye contact, no obvious depression anxiety, cognition and judgment appear normal. LN: no cervical axillary inguinal adenopathy No noted deficits in memory, attention, and speech.   Lab Results  Component Value Date   WBC 3.4 10/31/2020   HGB 14.2 10/31/2020   HCT 42.0 10/31/2020   PLT 245 10/31/2020   GLUCOSE 83 06/22/2020   CHOL 239 (H) 10/31/2020   TRIG 61 10/31/2020   HDL 118 10/31/2020   LDLDIRECT 149.6 02/02/2013   LDLCALC 111 (H) 10/31/2020   ALT 20 06/22/2020   AST 23 06/22/2020   NA 140 06/22/2020   K 4.8 06/22/2020   CL 103 06/22/2020   CREATININE 0.64 06/22/2020   BUN 18 06/22/2020   CO2 32 06/22/2020   TSH 1.68 06/22/2020   INR 0.91 11/10/2009   HGBA1C 5.6 06/02/2018   fasting ASSESSMENT AND PLAN:  Discussed the following assessment and plan:  Visit for preventive health examination  Hearing aid consultation  Hyperlipidemia with target LDL less than 100 - Plan: Basic metabolic panel, CBC with Differential/Platelet, Hepatic function panel, Lipid panel, TSH, T4, free, T4, free, TSH, Lipid panel, Hepatic function panel, CBC with Differential/Platelet, Basic metabolic panel  Essential hypertension - Plan: Basic metabolic panel, CBC with Differential/Platelet, Hepatic function panel, Lipid panel, TSH, T4, free, T4, free, TSH, Lipid panel, Hepatic function panel, CBC  with Differential/Platelet, Basic metabolic panel  Hypothyroidism, unspecified type - Plan: Basic metabolic panel, CBC with Differential/Platelet, Hepatic function panel, Lipid panel, TSH, T4, free, T4, free, TSH, Lipid panel, Hepatic function panel, CBC with Differential/Platelet, Basic metabolic panel  Medication management - Plan: Basic metabolic panel, CBC with Differential/Platelet, Hepatic function panel, Lipid panel, TSH, T4, free, T4, free, TSH, Lipid panel, Hepatic function panel, CBC with Differential/Platelet, Basic metabolic panel  Leukopenia, unspecified type  Intermittent atrial fibrillation (HCC) paf  Anticoagulant long-term use Update labs today  Reviewed medication  will refill levothyroxine if tsh in range  Minimal sx intermittent AF  Fall prevention  Continue lifestyle intervention healthy eating and exercise . Check Bp bid for 3-7 days and bring monitor to next visit  want to avoid hypotension .  Suspect  BP controlled at home   Patient Care Team: Tomisha Reppucci, Standley Brooking, MD as PCP - General Johney Frame Greer Ee, MD as PCP - Cardiology (Cardiology) Elsie Saas, MD (Orthopedic Surgery) Unice Bailey, MD (Internal Medicine) Pyrtle, Lajuan Lines, MD as Consulting Physician (Gastroenterology) Pyrtle, Lajuan Lines, MD as Consulting Physician (Gastroenterology) Marica Otter, OD (Optometry) Freada Bergeron, MD as Consulting Physician (Cardiology) Viona Gilmore, [facility] as Pharmacist (Pharmacist)  Patient Instructions  Good to see  you today . Will notify you  of labs when available.   If all ok then yearly  visit  cpx and labs . Continue lifestyle intervention healthy eating and exercise .   Health Maintenance, Female Adopting a healthy lifestyle and getting preventive care are important in promoting health and wellness. Ask your health care provider about: The right schedule for you to have regular tests and exams. Things you can do on your own to prevent diseases and keep  yourself healthy. What should I know about diet, weight, and exercise? Eat a healthy diet  Eat a diet that includes plenty of vegetables, fruits, low-fat dairy products,  and lean protein. Do not eat a lot of foods that are high in solid fats, added sugars, or sodium. Maintain a healthy weight Body mass index (BMI) is used to identify weight problems. It estimates body fat based on height and weight. Your health care provider can help determine your BMI and help you achieve or maintain a healthy weight. Get regular exercise Get regular exercise. This is one of the most important things you can do for your health. Most adults should: Exercise for at least 150 minutes each week. The exercise should increase your heart rate and make you sweat (moderate-intensity exercise). Do strengthening exercises at least twice a week. This is in addition to the moderate-intensity exercise. Spend less time sitting. Even light physical activity can be beneficial. Watch cholesterol and blood lipids Have your blood tested for lipids and cholesterol at 78 years of age, then have this test every 5 years. Have your cholesterol levels checked more often if: Your lipid or cholesterol levels are high. You are older than 78 years of age. You are at high risk for heart disease. What should I know about cancer screening? Depending on your health history and family history, you may need to have cancer screening at various ages. This may include screening for: Breast cancer. Cervical cancer. Colorectal cancer. Skin cancer. Lung cancer. What should I know about heart disease, diabetes, and high blood pressure? Blood pressure and heart disease High blood pressure causes heart disease and increases the risk of stroke. This is more likely to develop in people who have high blood pressure readings or are overweight. Have your blood pressure checked: Every 3-5 years if you are 12-65 years of age. Every year if you are 45  years old or older. Diabetes Have regular diabetes screenings. This checks your fasting blood sugar level. Have the screening done: Once every three years after age 21 if you are at a normal weight and have a low risk for diabetes. More often and at a younger age if you are overweight or have a high risk for diabetes. What should I know about preventing infection? Hepatitis B If you have a higher risk for hepatitis B, you should be screened for this virus. Talk with your health care provider to find out if you are at risk for hepatitis B infection. Hepatitis C Testing is recommended for: Everyone born from [birth year] through 1963. Anyone with known risk factors for hepatitis C. Sexually transmitted infections (STIs) Get screened for STIs, including gonorrhea and chlamydia, if: You are sexually active and are younger than 78 years of age. You are older than 78 years of age and your health care provider tells you that you are at risk for this type of infection. Your sexual activity has changed since you were last screened, and you are at increased risk for chlamydia or gonorrhea. Ask your health care provider if you are at risk. Ask your health care provider about whether you are at high risk for HIV. Your health care provider may recommend a prescription medicine to help prevent HIV infection. If you choose to take medicine to prevent HIV, you should first get tested for HIV. You should then be tested every 3 months for as long as you are taking the medicine. Pregnancy If you are about to stop having your period (premenopausal) and you may become pregnant, seek counseling before you get pregnant. Take 400 to 800 micrograms (mcg) of folic acid every day if you become pregnant. Ask for birth control (  contraception) if you want to prevent pregnancy. Osteoporosis and menopause Osteoporosis is a disease in which the bones lose minerals and strength with aging. This can result in bone fractures. If you are  44 years old or older, or if you are at risk for osteoporosis and fractures, ask your health care provider if you should: Be screened for bone loss. Take a calcium or vitamin D supplement to lower your risk of fractures. Be given hormone replacement therapy (HRT) to treat symptoms of menopause. Follow these instructions at home: Alcohol use Do not drink alcohol if: Your health care provider tells you not to drink. You are pregnant, may be pregnant, or are planning to become pregnant. If you drink alcohol: Limit how much you have to: 0-1 drink a day. Know how much alcohol is in your drink. In the [place] one drink equals one 12 oz bottle of beer (355 mL), one 5 oz glass of wine (148 mL), or one 1 oz glass of hard liquor (44 mL). Lifestyle Do not use any products that contain nicotine or tobacco. These products include cigarettes, chewing tobacco, and vaping devices, such as e-cigarettes. If you need help quitting, ask your health care provider. Do not use street drugs. Do not share needles. Ask your health care provider for help if you need support or information about quitting drugs. General instructions Schedule regular health, dental, and eye exams. Stay current with your vaccines. Tell your health care provider if: You often feel depressed. You have ever been abused or do not feel safe at home. Summary Adopting a healthy lifestyle and getting preventive care are important in promoting health and wellness. Follow your health care provider's instructions about healthy diet, exercising, and getting tested or screened for diseases. Follow your health care provider's instructions on monitoring your cholesterol and blood pressure. This information is not intended to replace advice given to you by your health care provider. Make sure you discuss any questions you have with your health care provider. Document Revised: 02/05/2021 Document Reviewed: 02/05/2021 Elsevier Patient Education  2022  [place] Khalea Ventura M.D.

## 2021-08-07 ENCOUNTER — Encounter: Payer: Self-pay | Admitting: Internal Medicine

## 2021-08-07 ENCOUNTER — Ambulatory Visit (INDEPENDENT_AMBULATORY_CARE_PROVIDER_SITE_OTHER): Payer: PPO | Admitting: Internal Medicine

## 2021-08-07 ENCOUNTER — Other Ambulatory Visit: Payer: Self-pay | Admitting: Internal Medicine

## 2021-08-07 VITALS — BP 140/86 | HR 60 | Temp 97.8°F | Ht 64.0 in | Wt 127.2 lb

## 2021-08-07 DIAGNOSIS — I480 Paroxysmal atrial fibrillation: Secondary | ICD-10-CM | POA: Diagnosis not present

## 2021-08-07 DIAGNOSIS — Z79899 Other long term (current) drug therapy: Secondary | ICD-10-CM

## 2021-08-07 DIAGNOSIS — Z7901 Long term (current) use of anticoagulants: Secondary | ICD-10-CM | POA: Diagnosis not present

## 2021-08-07 DIAGNOSIS — I10 Essential (primary) hypertension: Secondary | ICD-10-CM | POA: Diagnosis not present

## 2021-08-07 DIAGNOSIS — E039 Hypothyroidism, unspecified: Secondary | ICD-10-CM | POA: Diagnosis not present

## 2021-08-07 DIAGNOSIS — Z7189 Other specified counseling: Secondary | ICD-10-CM

## 2021-08-07 DIAGNOSIS — E785 Hyperlipidemia, unspecified: Secondary | ICD-10-CM | POA: Diagnosis not present

## 2021-08-07 DIAGNOSIS — E2839 Other primary ovarian failure: Secondary | ICD-10-CM

## 2021-08-07 DIAGNOSIS — Z0000 Encounter for general adult medical examination without abnormal findings: Secondary | ICD-10-CM | POA: Diagnosis not present

## 2021-08-07 DIAGNOSIS — D72819 Decreased white blood cell count, unspecified: Secondary | ICD-10-CM | POA: Diagnosis not present

## 2021-08-07 LAB — CBC WITH DIFFERENTIAL/PLATELET
Basophils Absolute: 0 10*3/uL (ref 0.0–0.1)
Basophils Relative: 1.3 % (ref 0.0–3.0)
Eosinophils Absolute: 0.1 10*3/uL (ref 0.0–0.7)
Eosinophils Relative: 2.2 % (ref 0.0–5.0)
HCT: 41.8 % (ref 36.0–46.0)
Hemoglobin: 14 g/dL (ref 12.0–15.0)
Lymphocytes Relative: 26.3 % (ref 12.0–46.0)
Lymphs Abs: 0.9 10*3/uL (ref 0.7–4.0)
MCHC: 33.5 g/dL (ref 30.0–36.0)
MCV: 96.8 fl (ref 78.0–100.0)
Monocytes Absolute: 0.5 10*3/uL (ref 0.1–1.0)
Monocytes Relative: 14 % — ABNORMAL HIGH (ref 3.0–12.0)
Neutro Abs: 1.8 10*3/uL (ref 1.4–7.7)
Neutrophils Relative %: 56.2 % (ref 43.0–77.0)
Platelets: 257 10*3/uL (ref 150.0–400.0)
RBC: 4.31 Mil/uL (ref 3.87–5.11)
RDW: 14.1 % (ref 11.5–15.5)
WBC: 3.3 10*3/uL — ABNORMAL LOW (ref 4.0–10.5)

## 2021-08-07 LAB — LIPID PANEL
Cholesterol: 246 mg/dL — ABNORMAL HIGH (ref 0–200)
HDL: 102.7 mg/dL (ref >39.00)
LDL Cholesterol: 131 mg/dL — ABNORMAL HIGH (ref 0–99)
NonHDL: 143.76
Total CHOL/HDL Ratio: 2
Triglycerides: 64 mg/dL (ref 0.0–149.0)
VLDL: 12.8 mg/dL (ref 0.0–40.0)

## 2021-08-07 LAB — BASIC METABOLIC PANEL
BUN: 19 mg/dL (ref 6–23)
CO2: 28 mEq/L (ref 19–32)
Calcium: 9.6 mg/dL (ref 8.4–10.5)
Chloride: 102 mEq/L (ref 96–112)
Creatinine, Ser: 0.63 mg/dL (ref 0.40–1.20)
GFR: 85.12 mL/min (ref >60.00)
Glucose, Bld: 90 mg/dL (ref 70–99)
Potassium: 4 mEq/L (ref 3.5–5.1)
Sodium: 138 mEq/L (ref 135–145)

## 2021-08-07 LAB — HEPATIC FUNCTION PANEL
ALT: 26 U/L (ref 0–35)
AST: 25 U/L (ref 0–37)
Albumin: 4.3 g/dL (ref 3.5–5.2)
Alkaline Phosphatase: 64 U/L (ref 39–117)
Bilirubin, Direct: 0.1 mg/dL (ref 0.0–0.3)
Total Bilirubin: 0.7 mg/dL (ref 0.2–1.2)
Total Protein: 7.5 g/dL (ref 6.0–8.3)

## 2021-08-07 LAB — TSH: TSH: 1.14 u[IU]/mL (ref 0.35–5.50)

## 2021-08-07 LAB — T4, FREE: Free T4: 1.42 ng/dL (ref 0.60–1.60)

## 2021-08-07 MED ORDER — LEVOTHYROXINE SODIUM 88 MCG PO TABS: 88.0000 ug | ORAL_TABLET | Freq: Every day | ORAL | 3 refills | Status: DC

## 2021-08-07 NOTE — Progress Notes (Signed)
Results are stable  thyroid in range  will send in refill thyroid med to your pharmacy

## 2021-08-07 NOTE — Patient Instructions (Signed)
Good to see  you today . Will notify you  of labs when available.   If all ok then yearly  visit  cpx and labs . Continue lifestyle intervention healthy eating and exercise .   Health Maintenance, Female Adopting a healthy lifestyle and getting preventive care are important in promoting health and wellness. Ask your health care provider about: The right schedule for you to have regular tests and exams. Things you can do on your own to prevent diseases and keep yourself healthy. What should I know about diet, weight, and exercise? Eat a healthy diet  Eat a diet that includes plenty of vegetables, fruits, low-fat dairy products, and lean protein. Do not eat a lot of foods that are high in solid fats, added sugars, or sodium. Maintain a healthy weight Body mass index (BMI) is used to identify weight problems. It estimates body fat based on height and weight. Your health care provider can help determine your BMI and help you achieve or maintain a healthy weight. Get regular exercise Get regular exercise. This is one of the most important things you can do for your health. Most adults should: Exercise for at least 150 minutes each week. The exercise should increase your heart rate and make you sweat (moderate-intensity exercise). Do strengthening exercises at least twice a week. This is in addition to the moderate-intensity exercise. Spend less time sitting. Even light physical activity can be beneficial. Watch cholesterol and blood lipids Have your blood tested for lipids and cholesterol at 78 years of age, then have this test every 5 years. Have your cholesterol levels checked more often if: Your lipid or cholesterol levels are high. You are older than 78 years of age. You are at high risk for heart disease. What should I know about cancer screening? Depending on your health history and family history, you may need to have cancer screening at various ages. This may include screening  for: Breast cancer. Cervical cancer. Colorectal cancer. Skin cancer. Lung cancer. What should I know about heart disease, diabetes, and high blood pressure? Blood pressure and heart disease High blood pressure causes heart disease and increases the risk of stroke. This is more likely to develop in people who have high blood pressure readings or are overweight. Have your blood pressure checked: Every 3-5 years if you are 39-18 years of age. Every year if you are 39 years old or older. Diabetes Have regular diabetes screenings. This checks your fasting blood sugar level. Have the screening done: Once every three years after age 64 if you are at a normal weight and have a low risk for diabetes. More often and at a younger age if you are overweight or have a high risk for diabetes. What should I know about preventing infection? Hepatitis B If you have a higher risk for hepatitis B, you should be screened for this virus. Talk with your health care provider to find out if you are at risk for hepatitis B infection. Hepatitis C Testing is recommended for: Everyone born from [birth year] through 1963. Anyone with known risk factors for hepatitis C. Sexually transmitted infections (STIs) Get screened for STIs, including gonorrhea and chlamydia, if: You are sexually active and are younger than 78 years of age. You are older than 78 years of age and your health care provider tells you that you are at risk for this type of infection. Your sexual activity has changed since you were last screened, and you are at increased risk for chlamydia  or gonorrhea. Ask your health care provider if you are at risk. Ask your health care provider about whether you are at high risk for HIV. Your health care provider may recommend a prescription medicine to help prevent HIV infection. If you choose to take medicine to prevent HIV, you should first get tested for HIV. You should then be tested every 3 months for as long as you  are taking the medicine. Pregnancy If you are about to stop having your period (premenopausal) and you may become pregnant, seek counseling before you get pregnant. Take 400 to 800 micrograms (mcg) of folic acid every day if you become pregnant. Ask for birth control (contraception) if you want to prevent pregnancy. Osteoporosis and menopause Osteoporosis is a disease in which the bones lose minerals and strength with aging. This can result in bone fractures. If you are 45 years old or older, or if you are at risk for osteoporosis and fractures, ask your health care provider if you should: Be screened for bone loss. Take a calcium or vitamin D supplement to lower your risk of fractures. Be given hormone replacement therapy (HRT) to treat symptoms of menopause. Follow these instructions at home: Alcohol use Do not drink alcohol if: Your health care provider tells you not to drink. You are pregnant, may be pregnant, or are planning to become pregnant. If you drink alcohol: Limit how much you have to: 0-1 drink a day. Know how much alcohol is in your drink. In the [place] one drink equals one 12 oz bottle of beer (355 mL), one 5 oz glass of wine (148 mL), or one 1 oz glass of hard liquor (44 mL). Lifestyle Do not use any products that contain nicotine or tobacco. These products include cigarettes, chewing tobacco, and vaping devices, such as e-cigarettes. If you need help quitting, ask your health care provider. Do not use street drugs. Do not share needles. Ask your health care provider for help if you need support or information about quitting drugs. General instructions Schedule regular health, dental, and eye exams. Stay current with your vaccines. Tell your health care provider if: You often feel depressed. You have ever been abused or do not feel safe at home. Summary Adopting a healthy lifestyle and getting preventive care are important in promoting health and wellness. Follow your  health care provider's instructions about healthy diet, exercising, and getting tested or screened for diseases. Follow your health care provider's instructions on monitoring your cholesterol and blood pressure. This information is not intended to replace advice given to you by your health care provider. Make sure you discuss any questions you have with your health care provider. Document Revised: 02/05/2021 Document Reviewed: 02/05/2021 Elsevier Patient Education  [place].

## 2021-08-08 NOTE — Telephone Encounter (Signed)
So a follow-up bone density can be done in 2 to 3 years from the last.   Purpose being to look for a drop of significance. Bone density usually slowly goes down with age but will look for large drops for evaluation and options for intervention .   Please order a bone density diagnosis estrogen deficiency at a [facility]

## 2021-08-21 ENCOUNTER — Telehealth: Payer: Self-pay | Admitting: Pharmacist

## 2021-08-21 NOTE — Chronic Care Management (AMB) (Signed)
    Chronic Care Management Pharmacy Assistant   Name: [name]  MRN: [id number] DOB: 04/21/1941  08/22/2021 APPOINTMENT REMINDER   Crystal Odom was reminded to have all medications, supplements and any blood glucose and blood pressure readings available for review with Jeni Salles, Pharm. D, at her telephone visit on 08/22/2021 at 10:45.   Questions: Have you had any recent office visit or specialist visit outside of [place]? No  Are there any concerns you would like to discuss during your office visit? No  Are you having any problems obtaining your medications? (Whether it pharmacy issues or cost) No  If patient has any PAP medications ask if they are having any problems getting their PAP medication or refill? No  Care Gaps: AWV - completed 11/07/20 Last BP - 140/86 on 08/07/2021 Hepatitis C screening - never done Influenza vaccine - due  Covid-19 vaccine - overdue  Star Rating Drug: Rosuvastatin 5mg  - [facility]  Any gaps in medications fill history? No  [place]  Catering manager [phone]

## 2021-08-22 ENCOUNTER — Ambulatory Visit (INDEPENDENT_AMBULATORY_CARE_PROVIDER_SITE_OTHER): Payer: PPO | Admitting: Pharmacist

## 2021-08-22 VITALS — BP 138/74

## 2021-08-22 DIAGNOSIS — E039 Hypothyroidism, unspecified: Secondary | ICD-10-CM

## 2021-08-22 DIAGNOSIS — I10 Essential (primary) hypertension: Secondary | ICD-10-CM

## 2021-08-22 NOTE — Progress Notes (Signed)
Chronic Care Management Pharmacy Note  08/22/2021 Name:  Crystal Odom MRN:  [id number] DOB:  06-28-1941  Summary: LDL not at goal < 100 BP fluctuates but is < 140/90 per home readings  Recommendations/Changes made from today's visit: -Recommend increasing rosuvastatin to target lower LDL and reached out to cardiology -Recommended supplementing with calcium citrate 600 mg daily  Plan: Follow up after discussion with cardiology  Subjective: Crystal Odom is an 80 y.o. year old female who is a primary patient of Panosh, Standley Brooking, MD.  The CCM team was consulted for assistance with disease management and care coordination needs.    Engaged with patient by telephone for follow up visit in response to provider referral for pharmacy case management and/or care coordination services.   Consent to Services:  The patient was given information about Chronic Care Management services, agreed to services, and gave verbal consent prior to initiation of services.  Please see initial visit note for detailed documentation.   Patient Care Team: Panosh, Standley Brooking, MD as PCP - General Johney Frame Greer Ee, MD as PCP - Cardiology (Cardiology) Elsie Saas, MD (Orthopedic Surgery) Unice Bailey, MD (Internal Medicine) Pyrtle, Lajuan Lines, MD as Consulting Physician (Gastroenterology) Pyrtle, Lajuan Lines, MD as Consulting Physician (Gastroenterology) Marica Otter, [place] (Optometry) Freada Bergeron, MD as Consulting Physician (Cardiology) Viona Gilmore, [facility] as Pharmacist (Pharmacist)  Recent office visits: 08/07/21 Shanon Ace, MD: Patient presented for annual exam.  Recent consult visits: 06/07/21 Laurann Montana, NP (cardiology): Patient presented for Afib follow up. No medication changes.  Hospital visits: None in previous 6 months  Objective:  Lab Results  Component Value Date   CREATININE 0.63 08/07/2021   BUN 19 08/07/2021   GFR 85.12 08/07/2021   GFRNONAA 86 06/22/2020    GFRAA 100 06/22/2020   NA 138 08/07/2021   K 4.0 08/07/2021   CALCIUM 9.6 08/07/2021   CO2 28 08/07/2021    Lab Results  Component Value Date/Time   HGBA1C 5.6 06/02/2018 08:36 AM   GFR 85.12 08/07/2021 11:24 AM   GFR 87.07 06/04/2019 09:06 AM    Last diabetic Eye exam: No results found for: HMDIABEYEEXA  Last diabetic Foot exam: No results found for: HMDIABFOOTEX   Lab Results  Component Value Date   CHOL 246 (H) 08/07/2021   HDL 102.70 08/07/2021   LDLCALC 131 (H) 08/07/2021   LDLDIRECT 149.6 02/02/2013   TRIG 64.0 08/07/2021   CHOLHDL 2 08/07/2021    Hepatic Function Latest Ref Rng & Units 08/07/2021 06/22/2020 06/04/2019  Total Protein 6.0 - 8.3 g/dL 7.5 7.1 7.4  Albumin 3.5 - 5.2 g/dL 4.3 - 4.2  AST 0 - 37 U/L '25 23 23  ' ALT 0 - 35 U/L '26 20 20  ' Alk Phosphatase 39 - 117 U/L 64 - 74  Total Bilirubin 0.2 - 1.2 mg/dL 0.7 0.7 0.8  Bilirubin, Direct 0.0 - 0.3 mg/dL 0.1 0.1 0.1    Lab Results  Component Value Date/Time   TSH 1.14 08/07/2021 11:24 AM   TSH 1.68 06/22/2020 07:39 AM   FREET4 1.42 08/07/2021 11:24 AM   FREET4 1.6 06/22/2020 07:39 AM    CBC Latest Ref Rng & Units 08/07/2021 10/31/2020 06/22/2020  WBC 4.0 - 10.5 K/uL 3.3(L) 3.4 2.4(L)  Hemoglobin 12.0 - 15.0 g/dL 14.0 14.2 13.3  Hematocrit 36.0 - 46.0 % 41.8 42.0 39.9  Platelets 150.0 - 400.0 K/uL 257.0 245 272    Lab Results  Component Value Date/Time   VD25OH 47 01/08/2012 10:33  AM    Clinical ASCVD: No  The ASCVD Risk score (Arnett DK, et al., 2019) failed to calculate for the following reasons:   The valid HDL cholesterol range is 20 to 100 mg/dL    Depression screen [facility] 2/9 11/07/2020 07/25/2020 06/04/2019  Decreased Interest 0 0 0  Down, Depressed, Hopeless 0 0 0  PHQ - 2 Score 0 0 0     CHA2DS2/VAS Stroke Risk Points  Current as of yesterday     4 >= 2 Points: High Risk  1 - 1.99 Points: Medium Risk  0 Points: Low Risk    No Change      Details    This score determines the patient's risk of  having a stroke if the  patient has atrial fibrillation.       Points Metrics  0 Has Congestive Heart Failure:  No    Current as of yesterday  0 Has Vascular Disease:  No    Current as of yesterday  1 Has Hypertension:  Yes    Current as of yesterday  2 Age:  80    Current as of yesterday  0 Has Diabetes:  No    Current as of yesterday  0 Had Stroke:  No  Had TIA:  No  Had Thromboembolism:  No    Current as of yesterday  1 Female:  Yes    Current as of yesterday        Social History   Tobacco Use  Smoking Status Never  Smokeless Tobacco Never   BP Readings from Last 3 Encounters:  08/22/21 138/74  08/07/21 140/86  06/07/21 140/90   Pulse Readings from Last 3 Encounters:  08/07/21 60  06/07/21 (!) 58  10/31/20 (!) 116   Wt Readings from Last 3 Encounters:  08/07/21 127 lb 3.2 oz (57.7 kg)  06/07/21 131 lb (59.4 kg)  10/31/20 129 lb 6.4 oz (58.7 kg)    Assessment/Interventions: Review of patient past medical history, allergies, medications, health status, including review of consultants reports, laboratory and other test data, was performed as part of comprehensive evaluation and provision of chronic care management services.   SDOH:  (Social Determinants of Health) assessments and interventions performed: No     CCM Care Plan  Allergies  Allergen Reactions   Penicillins     hives    Medications Reviewed Today     Reviewed by Viona Gilmore, [facility] (Pharmacist) on 08/22/21 at 1100  Med List Status: <None>   Medication Order Taking? Sig Documenting Provider Last Dose Status Informant  apixaban (ELIQUIS) 5 MG TABS tablet [id number] No Take 1 tablet (5 mg total) by mouth 2 (two) times daily. Loel Dubonnet, NP Taking Active   cholecalciferol (VITAMIN D3) 25 MCG (1000 UNIT) tablet [id number] No Take 2,000 Units by mouth daily.  [provider] Taking Active   levothyroxine (SYNTHROID) 88 MCG tablet [id number]  Take 1 tablet (88 mcg total) by mouth  daily. Panosh, Standley Brooking, MD  Active   metoprolol succinate (TOPROL XL) 25 MG 24 hr tablet [id number] No Take 0.5 tablets (12.5 mg total) by mouth 2 (two) times daily. Freada Bergeron, MD Taking Active   pantoprazole (PROTONIX) 40 MG tablet [id number] No Take 1 tablet (40 mg total) by mouth daily. Freada Bergeron, MD Taking Active   rosuvastatin (CRESTOR) 5 MG tablet [id number] No Take 1 tablet (5 mg total) by mouth daily. Loel Dubonnet, NP Taking Active  Patient Active Problem List   Diagnosis Date Noted   Incidental pulmonary nodule, > 26m and < 831m09/28/2017   Agatston coronary artery calcium score less than 100  0 06/27/2016   Aortic valve calcification 06/27/2016   Medicare annual wellness visit, subsequent 05/02/2014   Personal history of kidney stones 01/09/2012   Abnormal LFTs    Leukopenia    Family history of autoimmune disorder 02/06/2011   UNSPECIFIED ARTHOPATHY, HAND 10/03/2009   Chest pain 12/05/2008   LIVER FUNCTION TESTS, ABNORMAL 10/31/2008   NEUTROPENIA UNSPECIFIED 08/10/2007   Disorder of bone and cartilage 08/10/2007   Hypothyroidism 06/16/2007   Hyperlipidemia with target LDL less than 100 06/16/2007    Immunization History  Administered Date(s) Administered   Fluad Quad(high Dose 65+) 06/04/2019, 05/26/2020, 06/29/2021   Hep A / Hep B 12/28/2009, 02/01/2010   Influenza Split 06/17/2013   Influenza Whole 08/10/2007, 07/07/2008   Influenza, High Dose Seasonal PF 07/13/2014, 06/28/2015, 07/05/2016, 05/28/2017, 06/02/2018   Influenza-Unspecified 06/28/2015   PFIZER Comirnaty(Gray Top)Covid-19 Tri-Sucrose Vaccine 01/09/2021   PFIZER(Purple Top)SARS-COV-2 Vaccination 10/10/2019, 10/31/2019, 07/03/2020   Pfizer Covid-19 Vaccine Bivalent Booster 1227yr up 06/14/2021   Pneumococcal Conjugate-13 05/02/2014   Pneumococcal Polysaccharide-23 07/19/2008   Td 10/31/2002   Tdap 02/02/2013   Zoster Recombinat (Shingrix) 10/31/2016, 01/28/2018    Zoster, Live 08/10/2007   Patient is still walking during the morning almost every morning and is enjoying this and keeping up with it. Patient was pleased with bad cholesterol coming down but wasn't sure if it was low enough.  Conditions to be addressed/monitored:  Hypertension, Hyperlipidemia, Atrial Fibrillation, Hypothyroidism, and Osteopenia  Conditions addressed this visit: Hypothyroidism, hypertension, hyperlipidemia, osteopenia  Care Plan : CCM Pharmacy Care Plan  Updates made by PryViona GilmorePHHaydennce 08/22/2021 12:00 AM     Problem: Problem: Hypertension, Hyperlipidemia, Atrial Fibrillation and Hypothyroidism      Long-Range Goal: Patient-Specific Goal   Start Date: 11/13/2020  Expected End Date: 11/13/2021  Recent Progress: On track  Priority: High  Note:   Current Barriers:  Unable to independently monitor therapeutic efficacy Unable to achieve control of cholesterol   Pharmacist Clinical Goal(s):  Patient will achieve adherence to monitoring guidelines and medication adherence to achieve therapeutic efficacy through collaboration with PharmD and provider.   Interventions: 1:1 collaboration with Panosh, WanStandley BrookingD regarding development and update of comprehensive plan of care as evidenced by provider attestation and co-signature Inter-disciplinary care team collaboration (see longitudinal plan of care) Comprehensive medication review performed; medication list updated in electronic medical record  Hypertension (BP goal <140/90) -Controlled -Current treatment: Metoprolol succinate 12.5 mg twice daily -Medications previously tried:  lisinopril (lightheaded)  -Current home readings: ranges from 120/70 - 138/74; HR 52-60s; has brought cuff into the office and it was accurate  - arm cuff -Current dietary habits: patient looks at package labels and tries to limit salt intake (buys lower sodium canned foods) -Current exercise habits: walking daily and doing one  yoga class per week in addition to weight strengthening at the gym -Reports hypotensive/hypertensive symptoms when she took metoprolol on an empty stomach -Educated on BP goals and benefits of medications for prevention of heart attack, stroke and kidney damage; Importance of home blood pressure monitoring; Symptoms of hypotension and importance of maintaining adequate hydration; -Counseled to monitor BP at home at least weekly, document, and provide log at future appointments -Counseled on diet and exercise extensively Recommended to continue current medication Recommended lower sodium Morton's lite salt  Hyperlipidemia: (LDL goal < 100) -Uncontrolled -Current treatment: Rosuvastatin 5 mg 1 tablet daily -Medications previously tried: none -Current dietary patterns: uses cooking spray and canola and olive oil occasionally and sometimes real butter; patient eats lots of fiber including whole grains for bread -Current exercise habits: walking daily and doing one yoga class per week in addition to weight strengthening at the gym -Educated on Cholesterol goals;  Benefits of statin for ASCVD risk reduction; Importance of limiting foods high in cholesterol; -Recommended to continue current medication Collaborated with cardiology about increasing dose.  Atrial Fibrillation (Goal: prevent stroke and major bleeding) -Controlled -CHADSVASC: 4 -Current treatment: Rate control: metoprolol succinate 12.5 mg 1 tablet twice daily Anticoagulation: Eliquis 5 mg 1 tablet twice daily -Medications previously tried: none -Home BP and HR readings: 52-58  -Counseled on bleeding risk associated with Eliquis and importance of self-monitoring for signs/symptoms of bleeding; avoidance of NSAIDs due to increased bleeding risk with anticoagulants; -Recommended to continue current medication  Hypothyroidism (Goal: maintain TSH between 0.35-4.5) -Controlled -Current treatment  Levothyroxine 88 mcg 1 tablet  daily -Medications previously tried: none  -Recommended to continue current medication Counseled on taking the medication on an empty stomach separate from other medications  Coat stomach lining (Goal: minimize symptoms of heartburn or indigestion) -Controlled -Current treatment  Esomeprazole 40 mg 1 capsule daily -Medications previously tried: none -Recommended to continue current medication Counseled on taking 30 minutes before a meal  Osteopenia (Goal improve bone density and prevent fractures) -Controlled -Last DEXA Scan: 04/2018  T-Score femoral neck: -1.7, -1.9  T-Score total hip: n/a  T-Score lumbar spine: -0.8  T-Score forearm radius: n/a  10-year probability of major osteoporotic fracture: 11.3%  10-year probability of hip fracture: 2.9% -Patient is not a candidate for pharmacologic treatment -Current treatment  Vitamin D 1000 units 2 tablets daily -Medications previously tried: calcium  -Recommend [phone] units of vitamin D daily. Recommend 1200 mg of calcium daily from dietary and supplemental sources. Recommend weight-bearing and muscle strengthening exercises for building and maintaining bone density. -Recommended to continue current medication Recommended increasing calcium intake or supplementing with 600 mg daily.  Health Maintenance -Vaccine gaps: none -Current therapy:  none -Educated on Cost vs benefit of each product must be carefully weighed by individual consumer -Patient is satisfied with current therapy and denies issues -Recommended to continue without supplementation  Patient Goals/Self-Care Activities Patient will:  - take medications as prescribed check blood pressure at least weekly, document, and provide at future appointments  Follow Up Plan: Telephone follow up appointment with care management team member scheduled for: 1 year      Medication Assistance: None required.  Patient affirms current coverage meets  needs.  Compliance/Adherence/Medication fill history: Care Gaps: Hep C screening Last BP - 140/86 on 08/07/2021  Star-Rating Drugs: Rosuvastatin 87m - [facility]   Patient's preferred [facility]:  [id number] [place] [place] - [address] [place] [address] [place]: [phone]: [phone] [address]: [phone]: [phone]$45 a month for Eliquis - discussed patient assistance and donut hole  Uses pill box? No - all bottles in one container and takes those together Pt endorses 100% compliance  We discussed: Current pharmacy is preferred with insurance plan and patient is satisfied with pharmacy services Patient decided to: Continue current medication  management strategy  Care Plan and Follow Up Patient Decision:  Patient agrees to Care Plan and Follow-up.  Plan: Telephone follow up appointment with care management team member scheduled for:  1 year  Jeni Salles, PharmD [place] Pharmacist [employer] at [place] [phone]

## 2021-08-22 NOTE — Patient Instructions (Signed)
Hi Betsy,  It was great to catch up with you again! As we discussed, go ahead and either increase your calcium intake or take calcium citrate 600 mg per day. If you want to combine that with the vitamin D, that's a good option as well.  I will let you know when I hear back from cardiology!  Please reach out to me if you have any questions or need anything!  Best, Maddie  Jeni Salles, PharmD, [facility]   Visit Information   Goals Addressed   None    Patient Care Plan: CCM Pharmacy Care Plan     Problem Identified: Problem: Hypertension, Hyperlipidemia, Atrial Fibrillation and Hypothyroidism      Long-Range Goal: Patient-Specific Goal   Start Date: 11/13/2020  Expected End Date: 11/13/2021  Recent Progress: On track  Priority: High  Note:   Current Barriers:  Unable to independently monitor therapeutic efficacy Unable to achieve control of cholesterol   Pharmacist Clinical Goal(s):  Patient will achieve adherence to monitoring guidelines and medication adherence to achieve therapeutic efficacy through collaboration with PharmD and provider.   Interventions: 1:1 collaboration with Panosh, Standley Brooking, MD regarding development and update of comprehensive plan of care as evidenced by provider attestation and co-signature Inter-disciplinary care team collaboration (see longitudinal plan of care) Comprehensive medication review performed; medication list updated in electronic medical record  Hypertension (BP goal <140/90) -Controlled -Current treatment: Metoprolol succinate 12.5 mg twice daily -Medications previously tried:  lisinopril (lightheaded)  -Current home readings: ranges from 120/70 - 138/74; HR 52-60s; has brought cuff into the office and it was accurate  -arm cuff -Current dietary habits: patient looks at package labels and tries to limit salt intake (buys lower sodium canned foods) -Current exercise  habits: walking daily and doing one yoga class per week in addition to weight strengthening at the gym -Reports hypotensive/hypertensive symptoms when she took metoprolol on an empty stomach -Educated on BP goals and benefits of medications for prevention of heart attack, stroke and kidney damage; Importance of home blood pressure monitoring; Symptoms of hypotension and importance of maintaining adequate hydration; -Counseled to monitor BP at home at least weekly, document, and provide log at future appointments -Counseled on diet and exercise extensively Recommended to continue current medication Recommended lower sodium Morton's lite salt  Hyperlipidemia: (LDL goal < 100) -Uncontrolled -Current treatment: Rosuvastatin 5 mg 1 tablet daily -Medications previously tried: none -Current dietary patterns: uses cooking spray and canola and olive oil occasionally and sometimes real butter; patient eats lots of fiber including whole grains for bread -Current exercise habits: walking daily and doing one yoga class per week in addition to weight strengthening at the gym -Educated on Cholesterol goals;  Benefits of statin for ASCVD risk reduction; Importance of limiting foods high in cholesterol; -Recommended to continue current medication  Atrial Fibrillation (Goal: prevent stroke and major bleeding) -Controlled -CHADSVASC: 4 -Current treatment: Rate control: metoprolol succinate 12.5 mg 1 tablet twice daily Anticoagulation: Eliquis 5 mg 1 tablet twice daily -Medications previously tried: none -Home BP and HR readings: 52-58  -Counseled on bleeding risk associated with Eliquis and importance of self-monitoring for signs/symptoms of bleeding; avoidance of NSAIDs due to increased bleeding risk with anticoagulants; -Recommended to continue current medication  Hypothyroidism (Goal: maintain TSH between 0.35-4.5) -Controlled -Current treatment  Levothyroxine 88 mcg 1 tablet daily -Medications  previously tried: none  -Recommended to continue current medication Counseled on taking the medication on an empty stomach separate  from other medications  Coat stomach lining (Goal: minimize symptoms of heartburn or indigestion) -Controlled -Current treatment  Esomeprazole 40 mg 1 capsule daily -Medications previously tried: none -Recommended to continue current medication Counseled on taking 30 minutes before a meal  Osteopenia (Goal improve bone density and prevent fractures) -Controlled -Last DEXA Scan: 04/2018  T-Score femoral neck: -1.7, -1.9  T-Score total hip: n/a  T-Score lumbar spine: -0.8  T-Score forearm radius: n/a  10-year probability of major osteoporotic fracture: 11.3%  10-year probability of hip fracture: 2.9% -Patient is not a candidate for pharmacologic treatment -Current treatment  Vitamin D 1000 units 2 tablets daily -Medications previously tried: calcium  -Recommend [phone] units of vitamin D daily. Recommend 1200 mg of calcium daily from dietary and supplemental sources. Recommend weight-bearing and muscle strengthening exercises for building and maintaining bone density. -Recommended to continue current medication Counseled on dietary sources of calcium such as dairy products, leafy green vegetables, beans, etc.  Health Maintenance -Vaccine gaps: none -Current therapy:  none -Educated on Cost vs benefit of each product must be carefully weighed by individual consumer -Patient is satisfied with current therapy and denies issues -Recommended to continue without supplementation  Patient Goals/Self-Care Activities Over the next 180 days, patient will:  - take medications as prescribed check blood pressure at least weekly, document, and provide at future appointments  Follow Up Plan: Telephone follow up appointment with care management team member scheduled for: 1 year       Patient verbalizes understanding of instructions provided today and agrees  to view in [place].  The pharmacy team will reach out to the patient again over the next 30 days.   Viona Gilmore, [facility]

## 2021-08-28 ENCOUNTER — Other Ambulatory Visit (HOSPITAL_BASED_OUTPATIENT_CLINIC_OR_DEPARTMENT_OTHER): Payer: Self-pay | Admitting: Family

## 2021-08-28 ENCOUNTER — Encounter (HOSPITAL_BASED_OUTPATIENT_CLINIC_OR_DEPARTMENT_OTHER): Payer: Self-pay | Admitting: Family

## 2021-08-28 DIAGNOSIS — E7800 Pure hypercholesterolemia, unspecified: Secondary | ICD-10-CM

## 2021-08-28 MED ORDER — ROSUVASTATIN CALCIUM 10 MG PO TABS: 10.0000 mg | ORAL_TABLET | Freq: Every day | ORAL | 3 refills | Status: DC

## 2021-08-28 NOTE — Progress Notes (Signed)
Received note from pharmacist with PCP office that LDL above goal of 70. Increase Crestor to 10mg  QD with repeat CMP, fasting lipid panel in 6-8 weeks. Lab slips mailed to patient.   Loel Dubonnet, NP

## 2021-08-29 DIAGNOSIS — Z7901 Long term (current) use of anticoagulants: Secondary | ICD-10-CM

## 2021-08-29 DIAGNOSIS — E039 Hypothyroidism, unspecified: Secondary | ICD-10-CM | POA: Diagnosis not present

## 2021-08-29 DIAGNOSIS — I4891 Unspecified atrial fibrillation: Secondary | ICD-10-CM

## 2021-08-29 DIAGNOSIS — I10 Essential (primary) hypertension: Secondary | ICD-10-CM

## 2021-08-30 ENCOUNTER — Ambulatory Visit (INDEPENDENT_AMBULATORY_CARE_PROVIDER_SITE_OTHER)
Admission: RE | Admit: 2021-08-30 | Discharge: 2021-08-30 | Disposition: A | Discharge status: Home / Self Care | Payer: PPO | Source: Ambulatory Visit | Attending: Internal Medicine | Admitting: Internal Medicine

## 2021-08-30 ENCOUNTER — Other Ambulatory Visit: Payer: Self-pay

## 2021-08-30 DIAGNOSIS — E2839 Other primary ovarian failure: Secondary | ICD-10-CM | POA: Diagnosis not present

## 2021-09-21 NOTE — Progress Notes (Signed)
Bone density shows low bone mass  some decrease in spine. Not osteoporosis but  hip  t score is borderline for considering  adding bone building therapy .   Suggest  continue weight bearing activity adequate vit D intake . And repeat  dexa  in 2 years .

## 2021-09-26 ENCOUNTER — Other Ambulatory Visit: Payer: Self-pay | Admitting: Internal Medicine

## 2021-09-26 DIAGNOSIS — Z1231 Encounter for screening mammogram for malignant neoplasm of breast: Secondary | ICD-10-CM

## 2021-10-02 ENCOUNTER — Other Ambulatory Visit: Payer: Self-pay | Admitting: *Deleted

## 2021-10-02 MED ORDER — METOPROLOL SUCCINATE ER 25 MG PO TB24: 12.5000 mg | ORAL_TABLET | Freq: Two times a day (BID) | ORAL | 3 refills | Status: DC

## 2021-10-09 ENCOUNTER — Encounter: Payer: Self-pay | Admitting: Internal Medicine

## 2021-10-09 DIAGNOSIS — R300 Dysuria: Secondary | ICD-10-CM

## 2021-10-09 DIAGNOSIS — R319 Hematuria, unspecified: Secondary | ICD-10-CM

## 2021-10-10 ENCOUNTER — Ambulatory Visit: Payer: PPO | Admitting: Family Medicine

## 2021-10-10 ENCOUNTER — Encounter: Payer: Self-pay | Admitting: Internal Medicine

## 2021-10-10 ENCOUNTER — Other Ambulatory Visit (INDEPENDENT_AMBULATORY_CARE_PROVIDER_SITE_OTHER): Payer: PPO

## 2021-10-10 ENCOUNTER — Telehealth (INDEPENDENT_AMBULATORY_CARE_PROVIDER_SITE_OTHER): Payer: PPO | Admitting: Internal Medicine

## 2021-10-10 ENCOUNTER — Telehealth: Payer: Self-pay

## 2021-10-10 VITALS — BP 136/72 | HR 82 | Temp 97.1°F

## 2021-10-10 DIAGNOSIS — R300 Dysuria: Secondary | ICD-10-CM | POA: Diagnosis not present

## 2021-10-10 DIAGNOSIS — R319 Hematuria, unspecified: Secondary | ICD-10-CM | POA: Diagnosis not present

## 2021-10-10 DIAGNOSIS — R3989 Other symptoms and signs involving the genitourinary system: Secondary | ICD-10-CM | POA: Diagnosis not present

## 2021-10-10 LAB — URINALYSIS, ROUTINE W REFLEX MICROSCOPIC
Bilirubin Urine: NEGATIVE
Ketones, ur: NEGATIVE
Nitrite: POSITIVE — AB
Specific Gravity, Urine: >=1.03 — AB (ref 1.000–1.030)
Total Protein, Urine: 30 — AB
Urine Glucose: NEGATIVE
Urobilinogen, UA: 0.2 (ref 0.0–1.0)
pH: 5.5 (ref 5.0–8.0)

## 2021-10-10 MED ORDER — NITROFURANTOIN MONOHYD MACRO 100 MG PO CAPS: 100.0000 mg | ORAL_CAPSULE | Freq: Two times a day (BID) | ORAL | 0 refills | Status: DC

## 2021-10-10 NOTE — Telephone Encounter (Signed)
Has visit today

## 2021-10-10 NOTE — Telephone Encounter (Signed)
Patient was contacted and agreed to come in for a lab appt and Vv this afternoon with PCP

## 2021-10-10 NOTE — Telephone Encounter (Signed)
Just received a message this morning. Please arrange for her to get a clean-catch urine for point-of-care UA or urinalysis and urine culture dx suspected UTI Either here or at the [facility]  lab  Then can add her onto the schedule today at 430 for a video visit to review the findings and treat as appropriate.

## 2021-10-10 NOTE — Progress Notes (Signed)
Virtual Visit via Video Note  I connected with Crystal Odom on 10/10/21 at  4:30 PM EST by a video enabled telemedicine application and verified that I am speaking with the correct person using two identifiers. Location patient: home Location provider:work office Persons participating in the virtual visit: patient, provider  [place] national recommendations  regarding COVID 19 pandemic   video visit is advised over in office visit for this patient.  Patient aware  of the limitations of evaluation and management by telemedicine and  availability of in person appointments. and agreed to proceed.   HPI: Crystal Odom presents for video visit because of complaint of UTI symptoms that began about 3 days ago with urinary frequency urgency and some dysuria recently a little bit of blood getting worse over time. No vomiting diarrhea fever systemic symptoms Last UTI was many years ago and has not been on any antibiotics recently.  ROS: See pertinent positives and negatives per HPI.  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 neutropenia   Arthritis    left knee   Diplopia 2002   eval neg   Hyperlipidemia    HDL over 100   Hypothyroidism    Kidney stones    in past   Leukopenia    with neg hemevaluation bm bx get wbc diff q 6 months   UTI (lower urinary tract infection)     Past Surgical History:  Procedure Laterality Date   BONE MARROW BIOPSY     low WBC   CATARACT EXTRACTION, BILATERAL     kidney stone removal     left arthroscopic  2005   left knee   LIVER BIOPSY     reported normal   TONSILLECTOMY     TUBAL LIGATION      Family History  Problem Relation Age of Onset   Other Father        major renal failure; SCCA in ear   Osteoarthritis Father    Heart disease Father    Heart disease Mother        74   Autoimmune disease Sister    Autoimmune disease Brother    Stroke Maternal  Grandmother    Thyroid disease Other    Stroke Other    Osteoporosis Other     Social History   Tobacco Use   Smoking status: Never   Smokeless tobacco: Never  Vaping Use   Vaping Use: Never used  Substance Use Topics   Alcohol use: Yes    Alcohol/week: 2.0 - 3.0 standard drinks    Types: 2 - 3 Glasses of wine per week    Comment: 2 to 3 glasses a week    Drug use: No      Current Outpatient Medications:    apixaban (ELIQUIS) 5 MG TABS tablet, Take 1 tablet (5 mg total) by mouth 2 (two) times daily., Disp: 60 tablet, Rfl: 5   levothyroxine (SYNTHROID) 88 MCG tablet, Take 1 tablet (88 mcg total) by mouth daily., Disp: 90 tablet, Rfl: 3   metoprolol succinate (TOPROL XL) 25 MG 24 hr tablet, Take 0.5 tablets (12.5 mg total) by mouth 2 (two) times daily., Disp: 90 tablet, Rfl: 3   nitrofurantoin, macrocrystal-monohydrate, (MACROBID) 100 MG capsule, Take 1 capsule (100 mg total) by mouth 2 (two) times daily. For UTI, Disp: 14 capsule, Rfl: 0   pantoprazole (PROTONIX) 40 MG tablet, Take 1 tablet (40 mg total)  by mouth daily., Disp: 90 tablet, Rfl: 2   rosuvastatin (CRESTOR) 10 MG tablet, Take 1 tablet (10 mg total) by mouth daily., Disp: 90 tablet, Rfl: 3   cholecalciferol (VITAMIN D3) 25 MCG (1000 UNIT) tablet, Take 2,000 Units by mouth daily. , Disp: , Rfl:   EXAM: BP Readings from Last 3 Encounters:  10/10/21 136/72  08/22/21 138/74  08/07/21 140/86    VITALS per patient if applicable:  GENERAL: alert, oriented, appears well and in no acute distress  HEENT: atraumatic, conjunttiva clear, no obvious abnormalities on inspection of external nose and ears  NECK: normal movements of the head and neck  LUNGS: on inspection no signs of respiratory distress, breathing rate appears normal, no obvious gross SOB, gasping or wheezing  CV: no obvious cyanosis    PSYCH/NEURO: pleasant and cooperative, no obvious depression or anxiety, speech and thought processing grossly  intact Lab Results  Component Value Date   WBC 3.3 (L) 08/07/2021   HGB 14.0 08/07/2021   HCT 41.8 08/07/2021   PLT 257.0 08/07/2021   GLUCOSE 90 08/07/2021   CHOL 246 (H) 08/07/2021   TRIG 64.0 08/07/2021   HDL 102.70 08/07/2021   LDLDIRECT 149.6 02/02/2013   LDLCALC 131 (H) 08/07/2021   ALT 26 08/07/2021   AST 25 08/07/2021   NA 138 08/07/2021   K 4.0 08/07/2021   CL 102 08/07/2021   CREATININE 0.63 08/07/2021   BUN 19 08/07/2021   CO2 28 08/07/2021   TSH 1.14 08/07/2021   INR 0.91 11/10/2009   HGBA1C 5.6 06/02/2018  Urinalysis    Component Value Date/Time   COLORURINE YELLOW 10/10/2021 1044   APPEARANCEUR Cloudy (A) 10/10/2021 1044   LABSPEC >=1.030 (A) 10/10/2021 1044   PHURINE 5.5 10/10/2021 1044   GLUCOSEU NEGATIVE 10/10/2021 1044   HGBUR LARGE (A) 10/10/2021 1044   HGBUR trace-lysed 10/19/2008 0936   BILIRUBINUR NEGATIVE 10/10/2021 1044   KETONESUR NEGATIVE 10/10/2021 1044   UROBILINOGEN 0.2 10/10/2021 1044   NITRITE POSITIVE (A) 10/10/2021 1044   LEUKOCYTESUR MODERATE (A) 10/10/2021 1044       ASSESSMENT AND PLAN:  Discussed the following assessment and plan:    ICD-10-CM   1. Suspected UTI  R39.89      Typical presentation and urinalysis consistent with uncomplicated UTI Macrobid x5 to 7 days urine culture pending Can use OTC Uristat or similar if needed.  Expectant management and follow-up if persistent progressive or relapsing. Counseled.   Expectant management and discussion of plan and treatment with opportunity to ask questions and all were answered. The patient agreed with the plan and demonstrated an understanding of the instructions.   Advised to call back or seek an in-person evaluation if worsening  or having  further concerns  in interim. Return if symptoms worsen or fail to improve as expected.    Shanon Ace, MD

## 2021-10-12 ENCOUNTER — Ambulatory Visit
Admission: RE | Admit: 2021-10-12 | Discharge: 2021-10-12 | Disposition: A | Discharge status: Home / Self Care | Payer: PPO | Source: Ambulatory Visit

## 2021-10-12 DIAGNOSIS — Z1231 Encounter for screening mammogram for malignant neoplasm of breast: Secondary | ICD-10-CM

## 2021-10-12 LAB — URINE CULTURE
MICRO NUMBER:: 12856886
SPECIMEN QUALITY:: ADEQUATE

## 2021-10-13 NOTE — Progress Notes (Signed)
urine culture shows e coli  sensitive to medication given . Should resolve with current treatment .FU if not better. 

## 2021-10-15 ENCOUNTER — Telehealth: Payer: Self-pay | Admitting: Internal Medicine

## 2021-10-15 NOTE — Telephone Encounter (Signed)
Patient called to return Crystal Odom call regarding lab results.  Patient could be contacted at [phone].  Please advise.

## 2021-10-15 NOTE — Telephone Encounter (Signed)
Patient informed-see results note. 

## 2021-10-18 ENCOUNTER — Telehealth: Payer: Self-pay | Admitting: Internal Medicine

## 2021-10-18 DIAGNOSIS — E7800 Pure hypercholesterolemia, unspecified: Secondary | ICD-10-CM | POA: Diagnosis not present

## 2021-10-18 NOTE — Telephone Encounter (Signed)
Left message for patient to call back and schedule Medicare Annual Wellness Visit (AWV) either virtually or in office. Left  my Herbie Drape number [phone]   Last AWV 11/07/20  please schedule at anytime with [facility] Nurse Health Advisor 1 or 2  Awv can be schedule calendar year with healthteam  This should be a 45 minute visit.

## 2021-10-19 ENCOUNTER — Encounter (HOSPITAL_BASED_OUTPATIENT_CLINIC_OR_DEPARTMENT_OTHER): Payer: Self-pay

## 2021-10-19 LAB — COMPREHENSIVE METABOLIC PANEL
ALT: 30 IU/L (ref 0–32)
AST: 26 IU/L (ref 0–40)
Albumin/Globulin Ratio: 1.6 (ref 1.2–2.2)
Albumin: 4.2 g/dL (ref 3.7–4.7)
Alkaline Phosphatase: 93 IU/L (ref 44–121)
BUN/Creatinine Ratio: 29 — ABNORMAL HIGH (ref 12–28)
BUN: 20 mg/dL (ref 8–27)
Bilirubin Total: 0.4 mg/dL (ref 0.0–1.2)
CO2: 24 mmol/L (ref 20–29)
Calcium: 9.6 mg/dL (ref 8.7–10.3)
Chloride: 102 mmol/L (ref 96–106)
Creatinine, Ser: 0.68 mg/dL (ref 0.57–1.00)
Globulin, Total: 2.7 g/dL (ref 1.5–4.5)
Glucose: 86 mg/dL (ref 70–99)
Potassium: 4.7 mmol/L (ref 3.5–5.2)
Sodium: 143 mmol/L (ref 134–144)
Total Protein: 6.9 g/dL (ref 6.0–8.5)
eGFR: 89 mL/min/{1.73_m2} (ref >59)

## 2021-10-19 LAB — LIPID PANEL
Chol/HDL Ratio: 2.2 ratio (ref 0.0–4.4)
Cholesterol, Total: 224 mg/dL — ABNORMAL HIGH (ref 100–199)
HDL: 103 mg/dL (ref >39)
LDL Chol Calc (NIH): 110 mg/dL — ABNORMAL HIGH (ref 0–99)
Triglycerides: 65 mg/dL (ref 0–149)
VLDL Cholesterol Cal: 11 mg/dL (ref 5–40)

## 2021-10-19 MED ORDER — ROSUVASTATIN CALCIUM 20 MG PO TABS: 20.0000 mg | ORAL_TABLET | Freq: Every day | ORAL | 3 refills | Status: DC

## 2021-10-19 NOTE — Progress Notes (Signed)
Results sent via mychart message, with instructions for increasing medication dosage and when to report for labs. Orders placed and lab slips in the mail!

## 2021-10-19 NOTE — Addendum Note (Signed)
Addended by: Gerald Stabs on: 10/19/2021 08:41 AM   Modules accepted: Orders

## 2021-10-19 NOTE — Progress Notes (Signed)
Last read by [name] "Betsy" at  9:52 AM on 10/19/2021.

## 2021-10-29 ENCOUNTER — Other Ambulatory Visit: Payer: Self-pay | Admitting: *Deleted

## 2021-10-29 MED ORDER — PANTOPRAZOLE SODIUM 40 MG PO TBEC: 40.0000 mg | DELAYED_RELEASE_TABLET | Freq: Every day | ORAL | 2 refills | Status: DC

## 2021-10-31 ENCOUNTER — Ambulatory Visit (INDEPENDENT_AMBULATORY_CARE_PROVIDER_SITE_OTHER): Payer: PPO

## 2021-10-31 ENCOUNTER — Telehealth: Payer: Self-pay | Admitting: Internal Medicine

## 2021-10-31 VITALS — Ht 64.0 in | Wt 127.0 lb

## 2021-10-31 DIAGNOSIS — Z0000 Encounter for general adult medical examination without abnormal findings: Secondary | ICD-10-CM | POA: Diagnosis not present

## 2021-10-31 DIAGNOSIS — Z1159 Encounter for screening for other viral diseases: Secondary | ICD-10-CM

## 2021-10-31 NOTE — Telephone Encounter (Signed)
Pt had awv with Rise Paganini and per Arthur she need hep -c screening and need a lab order to sch.

## 2021-10-31 NOTE — Telephone Encounter (Signed)
Okay to add hepatitis C screening, suggest to do it at the same time as her future lipid liver panel when due.  See last lab comment

## 2021-10-31 NOTE — Patient Instructions (Addendum)
Crystal Odom , Thank you for taking time to come for your Medicare Wellness Visit. I appreciate your ongoing commitment to your health goals. Please review the following plan we discussed and let me know if I can assist you in the future.   These are the goals we discussed:  Goals      Patient Stated     Will add on resistance work or strength training  Go slow and low and get help at first      Patient Stated     I will continue to walk 30 minutes daily      Reduce sodium intake     Will try herbs substitute  Will give further info on dash diet  Trying to be plant based         This is a list of the screening recommended for you and due dates:  Health Maintenance  Topic Date Due   Hepatitis C Screening: USPSTF Recommendation to screen - Ages 27-79 yo.  Never done   Tetanus Vaccine  02/03/2023   Pneumonia Vaccine  Completed   Flu Shot  Completed   DEXA scan (bone density measurement)  Completed   COVID-19 Vaccine  Completed   Zoster (Shingles) Vaccine  Completed   HPV Vaccine  Aged Out   Colon Cancer Screening  Discontinued   Advanced directives: Yes  Conditions/risks identified: None  Next appointment: Follow up in one year for your annual wellness visit    Preventive Care 65 Years and Older, Female Preventive care refers to lifestyle choices and visits with your health care provider that can promote health and wellness. What does preventive care include? A yearly physical exam. This is also called an annual well check. Dental exams once or twice a year. Routine eye exams. Ask your health care provider how often you should have your eyes checked. Personal lifestyle choices, including: Daily care of your teeth and gums. Regular physical activity. Eating a healthy diet. Avoiding tobacco and drug use. Limiting alcohol use. Practicing safe sex. Taking low-dose aspirin every day. Taking vitamin and mineral supplements as recommended by your health care  provider. What happens during an annual well check? The services and screenings done by your health care provider during your annual well check will depend on your age, overall health, lifestyle risk factors, and family history of disease. Counseling  Your health care provider may ask you questions about your: Alcohol use. Tobacco use. Drug use. Emotional well-being. Home and relationship well-being. Sexual activity. Eating habits. History of falls. Memory and ability to understand (cognition). Work and work Statistician. Reproductive health. Screening  You may have the following tests or measurements: Height, weight, and BMI. Blood pressure. Lipid and cholesterol levels. These may be checked every 5 years, or more frequently if you are over 92 years old. Skin check. Lung cancer screening. You may have this screening every year starting at age 23 if you have a 30-pack-year history of smoking and currently smoke or have quit within the past 15 years. Fecal occult blood test (FOBT) of the stool. You may have this test every year starting at age 61. Flexible sigmoidoscopy or colonoscopy. You may have a sigmoidoscopy every 5 years or a colonoscopy every 10 years starting at age 42. Hepatitis C blood test. Hepatitis B blood test. Sexually transmitted disease (STD) testing. Diabetes screening. This is done by checking your blood sugar (glucose) after you have not eaten for a while (fasting). You may have this done every 1-3  years. Bone density scan. This is done to screen for osteoporosis. You may have this done starting at age 37. Mammogram. This may be done every 1-2 years. Talk to your health care provider about how often you should have regular mammograms. Talk with your health care provider about your test results, treatment options, and if necessary, the need for more tests. Vaccines  Your health care provider may recommend certain vaccines, such as: Influenza vaccine. This is  recommended every year. Tetanus, diphtheria, and acellular pertussis (Tdap, Td) vaccine. You may need a Td booster every 10 years. Zoster vaccine. You may need this after age 36. Pneumococcal 13-valent conjugate (PCV13) vaccine. One dose is recommended after age 13. Pneumococcal polysaccharide (PPSV23) vaccine. One dose is recommended after age 49. Talk to your health care provider about which screenings and vaccines you need and how often you need them. This information is not intended to replace advice given to you by your health care provider. Make sure you discuss any questions you have with your health care provider. Document Released: 10/13/2015 Document Revised: 06/05/2016 Document Reviewed: 07/18/2015 Elsevier Interactive Patient Education  2017 [employer] Prevention in the Home Falls can cause injuries. They can happen to people of all ages. There are many things you can do to make your home safe and to help prevent falls. What can I do on the outside of my home? Regularly fix the edges of walkways and driveways and fix any cracks. Remove anything that might make you trip as you walk through a door, such as a raised step or threshold. Trim any bushes or trees on the path to your home. Use bright outdoor lighting. Clear any walking paths of anything that might make someone trip, such as rocks or tools. Regularly check to see if handrails are loose or broken. Make sure that both sides of any steps have handrails. Any raised decks and porches should have guardrails on the edges. Have any leaves, snow, or ice cleared regularly. Use sand or salt on walking paths during winter. Clean up any spills in your garage right away. This includes oil or grease spills. What can I do in the bathroom? Use night lights. Install grab bars by the toilet and in the tub and shower. Do not use towel bars as grab bars. Use non-skid mats or decals in the tub or shower. If you need to sit down in  the shower, use a plastic, non-slip stool. Keep the floor dry. Clean up any water that spills on the floor as soon as it happens. Remove soap buildup in the tub or shower regularly. Attach bath mats securely with double-sided non-slip rug tape. Do not have throw rugs and other things on the floor that can make you trip. What can I do in the bedroom? Use night lights. Make sure that you have a light by your bed that is easy to reach. Do not use any sheets or blankets that are too big for your bed. They should not hang down onto the floor. Have a firm chair that has side arms. You can use this for support while you get dressed. Do not have throw rugs and other things on the floor that can make you trip. What can I do in the kitchen? Clean up any spills right away. Avoid walking on wet floors. Keep items that you use a lot in easy-to-reach places. If you need to reach something above you, use a strong step stool that has a grab  bar. Keep electrical cords out of the way. Do not use floor polish or wax that makes floors slippery. If you must use wax, use non-skid floor wax. Do not have throw rugs and other things on the floor that can make you trip. What can I do with my stairs? Do not leave any items on the stairs. Make sure that there are handrails on both sides of the stairs and use them. Fix handrails that are broken or loose. Make sure that handrails are as long as the stairways. Check any carpeting to make sure that it is firmly attached to the stairs. Fix any carpet that is loose or worn. Avoid having throw rugs at the top or bottom of the stairs. If you do have throw rugs, attach them to the floor with carpet tape. Make sure that you have a light switch at the top of the stairs and the bottom of the stairs. If you do not have them, ask someone to add them for you. What else can I do to help prevent falls? Wear shoes that: Do not have high heels. Have rubber bottoms. Are comfortable  and fit you well. Are closed at the toe. Do not wear sandals. If you use a stepladder: Make sure that it is fully opened. Do not climb a closed stepladder. Make sure that both sides of the stepladder are locked into place. Ask someone to hold it for you, if possible. Clearly mark and make sure that you can see: Any grab bars or handrails. First and last steps. Where the edge of each step is. Use tools that help you move around (mobility aids) if they are needed. These include: Canes. Walkers. Scooters. Crutches. Turn on the lights when you go into a dark area. Replace any light bulbs as soon as they burn out. Set up your furniture so you have a clear path. Avoid moving your furniture around. If any of your floors are uneven, fix them. If there are any pets around you, be aware of where they are. Review your medicines with your doctor. Some medicines can make you feel dizzy. This can increase your chance of falling. Ask your doctor what other things that you can do to help prevent falls. This information is not intended to replace advice given to you by your health care provider. Make sure you discuss any questions you have with your health care provider. Document Released: 07/13/2009 Document Revised: 02/22/2016 Document Reviewed: 10/21/2014 Elsevier Interactive Patient Education  2017 [employer].

## 2021-10-31 NOTE — Telephone Encounter (Signed)
Lab appt has been scheduled and labs ordered. Per pt request lab orders have been mailed so that pt can go to [facility] to have labs drawn.

## 2021-10-31 NOTE — Progress Notes (Signed)
Subjective:   Crystal Odom is a 81 y.o. female who presents for Medicare Annual (Subsequent) preventive examination.  Review of Systems    Virtual Visit via Telephone Note  I connected with  Crystal Odom on 10/31/21 at 11:15 AM EST by telephone and verified that I am speaking with the correct person using two identifiers.  Location: Patient: Home Provider: Office Persons participating in the virtual visit: patient/Nurse Health Advisor   I discussed the limitations, risks, security and privacy concerns of performing an evaluation and management service by telephone and the availability of in person appointments. The patient expressed understanding and agreed to proceed.  Interactive audio and video telecommunications were attempted between this nurse and patient, however failed, due to patient having technical difficulties OR patient did not have access to video capability.  We continued and completed visit with audio only.  Some vital signs may be absent or patient reported.   Crystal Peaches, LPN  Cardiac Risk Factors include: advanced age (>51mn, >>79women)     Objective:    Today's Vitals   10/31/21 1125 10/31/21 1126  Weight: 127 lb (57.6 kg)   Height: '5\' 4"'  (1.626 m)   PainSc:  1    Body mass index is 21.8 kg/m.  Advanced Directives 10/31/2021 11/07/2020 04/22/2018 07/05/2016  Does Patient Have a Medical Advance Directive? Yes Yes Yes Yes  Type of [place] - -  Does patient want to make changes to medical advance directive? No - Patient declined No - Patient declined - -  Copy of [place] Chart? No - copy requested No - copy requested - -    Current Medications (verified) Outpatient Encounter Medications as of 10/31/2021  Medication Sig   apixaban (ELIQUIS) 5 MG TABS tablet Take 1 tablet (5 mg total) by mouth 2 (two) times daily.    cholecalciferol (VITAMIN D3) 25 MCG (1000 UNIT) tablet Take 2,000 Units by mouth daily.    levothyroxine (SYNTHROID) 88 MCG tablet Take 1 tablet (88 mcg total) by mouth daily.   metoprolol succinate (TOPROL XL) 25 MG 24 hr tablet Take 0.5 tablets (12.5 mg total) by mouth 2 (two) times daily.   nitrofurantoin, macrocrystal-monohydrate, (MACROBID) 100 MG capsule Take 1 capsule (100 mg total) by mouth 2 (two) times daily. For UTI   pantoprazole (PROTONIX) 40 MG tablet Take 1 tablet (40 mg total) by mouth daily.   rosuvastatin (CRESTOR) 20 MG tablet Take 1 tablet (20 mg total) by mouth daily.   No facility-administered encounter medications on file as of 10/31/2021.    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   Arthritis    left knee   Diplopia 2002   eval neg   Hyperlipidemia    HDL over 100   Hypothyroidism    Kidney stones    in past   Leukopenia    with neg hemevaluation bm bx get wbc diff q 6 months   UTI (lower urinary tract infection)    Past Surgical History:  Procedure Laterality Date   BONE MARROW BIOPSY     low WBC   CATARACT EXTRACTION, BILATERAL     kidney stone removal     left arthroscopic  2005   left knee   LIVER BIOPSY     reported normal  TONSILLECTOMY     TUBAL LIGATION     Family History  Problem Relation Age of Onset   Other Father        major renal failure; SCCA in ear   Osteoarthritis Father    Heart disease Father    Heart disease Mother        36   Autoimmune disease Sister    Autoimmune disease Brother    Stroke Maternal Grandmother    Thyroid disease Other    Stroke Other    Osteoporosis Other    Social History   Socioeconomic History   Marital status: Married    Spouse name: Not on file   Number of children: Not on file   Years of education: Not on file   Highest education level: Bachelor's degree  (e.g., BA, AB, BS)  Occupational History   Not on file  Tobacco Use   Smoking status: Never   Smokeless tobacco: Never  Vaping Use   Vaping Use: Never used  Substance and Sexual Activity   Alcohol use: Yes    Alcohol/week: 2.0 - 3.0 standard drinks    Types: 2 - 3 Glasses of wine per week    Comment: 2 to 3 glasses a week    Drug use: No   Sexual activity: Not on file  Other Topics Concern   Not on file  Social History Narrative   Married   Retired  2008   Regular exercise-yes   HH of 2   No pets   Social etoh   [place] CABG 2012   Travel grandchildren    Social Determinants of Health   Financial Resource Strain: Low Risk    Difficulty of Paying Living Expenses: Not hard at all  Food Insecurity: No Food Insecurity   Worried About Charity fundraiser in the Last Year: Never true   Arboriculturist in the Last Year: Never true  Transportation Needs: No Transportation Needs   Lack of Transportation (Medical): No   Lack of Transportation (Non-Medical): No  Physical Activity: Sufficiently Active   Days of Exercise per Week: 7 days   Minutes of Exercise per Session: 30 min  Stress: No Stress Concern Present   Feeling of Stress : Not at all  Social Connections: Socially Integrated   Frequency of Communication with Friends and Family: Three times a week   Frequency of Social Gatherings with Friends and Family: Once a week   Attends Religious Services: More than 4 times per year   Active Member of [employer] or Organizations: Yes   Attends Music therapist: More than 4 times per year   Marital Status: Married     Clinical Intake:  Pre-visit preparation completed: Yes  Pain : 0-10 Pain Score: 1  Pain Location: Knee Pain Orientation: Right Pain Descriptors / Indicators: Aching Pain Onset: Today Pain Frequency: Intermittent Pain Relieving Factors: Exercise and Tylenol Effect of Pain on Daily Activities: None  Pain Relieving Factors:  Exercise and Tylenol  BMI - recorded: 21.8 Nutritional Status: BMI of 19-24  Normal Nutritional Risks: None Diabetes: No  How often do you need to have someone help you when you read instructions, pamphlets, or other written materials from your doctor or pharmacy?: 1 - Never  Diabetic? No  Interpreter Needed?: No  Information entered by :: Rolene Arbour LPN   Activities of Daily Living In your present state of health, do you have any difficulty  performing the following activities: 10/31/2021 10/29/2021  Hearing? N N  Comment - -  Vision? N N  Difficulty concentrating or making decisions? N N  Walking or climbing stairs? N N  Dressing or bathing? N N  Doing errands, shopping? N N  Preparing Food and eating ? N N  Using the Toilet? N N  In the past six months, have you accidently leaked urine? N N  Do you have problems with loss of bowel control? N N  Managing your Medications? N N  Managing your Finances? N N  Housekeeping or managing your Housekeeping? N N  Some recent data might be hidden    Patient Care Team: Panosh, Standley Brooking, MD as PCP - General Johney Frame Greer Ee, MD as PCP - Cardiology (Cardiology) Elsie Saas, MD (Orthopedic Surgery) Unice Bailey, MD (Internal Medicine) Pyrtle, Lajuan Lines, MD as Consulting Physician (Gastroenterology) Pyrtle, Lajuan Lines, MD as Consulting Physician (Gastroenterology) Marica Otter, [place] (Optometry) Freada Bergeron, MD as Consulting Physician (Cardiology) Viona Gilmore, [facility] as Pharmacist (Pharmacist)  Indicate any recent Medical Services you may have received from other than [facility] providers in the past year (date may be approximate).     Assessment:   This is a routine wellness examination for [place].  Hearing/Vision screen Hearing Screening - Comments:: Wears hearing aids. [facility] Vision Screening - Comments:: Wears glasses . Followed by Dr Sabra Heck  Dietary issues and exercise activities discussed: Current  Exercise Habits: Home exercise routine, Type of exercise: walking, Time (Minutes): 30, Frequency (Times/Week): 7, Weekly Exercise (Minutes/Week): 210, Intensity: Moderate, Exercise limited by: None identified   Goals Addressed   None    Depression Screen PHQ 2/9 Scores 10/31/2021 11/07/2020 07/25/2020 06/04/2019 06/02/2018 04/22/2018 07/05/2016  PHQ - 2 Score 0 0 0 0 0 0 0    Fall Risk Fall Risk  10/31/2021 10/29/2021 11/07/2020 07/25/2020 06/04/2019  Falls in the past year? 0 0 0 0 0  Comment - - - - -  Number falls in past yr: 0 0 0 0 0  Comment - - - - -  Injury with Fall? 0 0 0 0 0  Comment - - - - -  Risk for fall due to : No Fall Risks - No Fall Risks - -  Follow up - - Falls evaluation completed;Falls prevention discussed - Falls evaluation completed    FALL RISK PREVENTION PERTAINING TO THE HOME:  Any stairs in or around the home? Yes  If so, are there any without handrails? No  Home free of loose throw rugs in walkways, pet beds, electrical cords, etc? Yes  Adequate lighting in your home to reduce risk of falls? Yes   ASSISTIVE DEVICES UTILIZED TO PREVENT FALLS:  Life alert? No  Use of a cane, walker or w/c? No  Grab bars in the bathroom? Yes  Shower chair or bench in shower? No  Elevated toilet seat or a handicapped toilet? Yes   TIMED UP AND GO:  Was the test performed? No . Audio visit  Cognitive Function: MMSE - Mini Mental State Exam 04/22/2018 07/05/2016  Not completed: (No Data) (No Data)     6CIT Screen 10/31/2021  What Year? 0 points  What month? 0 points  What time? 0 points  Count back from 20 0 points  Months in reverse 0 points  Repeat phrase 0 points  Total Score 0    Immunizations Immunization History  Administered Date(s) Administered   Fluad Quad(high Dose 65+) 06/04/2019, 05/26/2020,  06/29/2021   Hep A / Hep B 12/28/2009, 02/01/2010   Influenza Split 06/17/2013   Influenza Whole 08/10/2007, 07/07/2008   Influenza, High Dose Seasonal PF 07/13/2014,  06/28/2015, 07/05/2016, 05/28/2017, 06/02/2018   Influenza-Unspecified 06/28/2015   PFIZER Comirnaty(Gray Top)Covid-19 Tri-Sucrose Vaccine 01/09/2021   PFIZER(Purple Top)SARS-COV-2 Vaccination 10/10/2019, 10/31/2019, 07/03/2020   Pfizer Covid-19 Vaccine Bivalent Booster 108yr & up 06/14/2021   Pneumococcal Conjugate-13 05/02/2014   Pneumococcal Polysaccharide-23 07/19/2008   Td 10/31/2002   Tdap 02/02/2013   Zoster Recombinat (Shingrix) 10/31/2016, 01/28/2018   Zoster, Live 08/10/2007    TDAP status: Up to date  Flu Vaccine status: Up to date  Pneumococcal vaccine status: Up to date  Covid-19 vaccine status: Completed vaccines  Qualifies for Shingles Vaccine? Yes   Zostavax completed Yes   Shingrix Completed?: Yes  Screening Tests Health Maintenance  Topic Date Due   Hepatitis C Screening  Never done   TETANUS/TDAP  02/03/2023   Pneumonia Vaccine 81 Years old  Completed   INFLUENZA VACCINE  Completed   DEXA SCAN  Completed   COVID-19 Vaccine  Completed   Zoster Vaccines- Shingrix  Completed   HPV VACCINES  Aged Out   COLONOSCOPY (Pts 45-461yrInsurance coverage will need to be confirmed)  Discontinued    Health Maintenance  Health Maintenance Due  Topic Date Due   Hepatitis C Screening  Never done      Additional Screening:   Vision Screening: Recommended annual ophthalmology exams for early detection of glaucoma and other disorders of the eye. Is the patient up to date with their annual eye exam?  Yes  Who is the provider or what is the name of the office in which the patient attends annual eye exams? Dr MiSabra Heckf pt is not established with a provider, would they like to be referred to a provider to establish care? No .   Dental Screening: Recommended annual dental exams for proper oral hygiene  Community Resource Referral / Chronic Care Management: CRR required this visit?  No   CCM required this visit?  No      Plan:     I have personally  reviewed and noted the following in the patients chart:   Medical and social history Use of alcohol, tobacco or illicit drugs  Current medications and supplements including opioid prescriptions.  Functional ability and status Nutritional status Physical activity Advanced directives List of other physicians Hospitalizations, surgeries, and ER visits in previous 12 months Vitals Screenings to include cognitive, depression, and falls Referrals and appointments  In addition, I have reviewed and discussed with patient certain preventive protocols, quality metrics, and best practice recommendations. A written personalized care plan for preventive services as well as general preventive health recommendations were provided to patient.     BeCriselda PeachesLPN   2/4/0/9811 Nurse Notes: Patient due for Hep- C Screening. Appt pending.

## 2021-11-01 ENCOUNTER — Encounter: Payer: Self-pay | Admitting: Cardiology

## 2021-11-01 NOTE — Telephone Encounter (Signed)
Will route to PharmD team for further advisement on this.

## 2021-11-06 ENCOUNTER — Telehealth: Payer: Self-pay | Admitting: Pharmacist

## 2021-11-06 NOTE — Chronic Care Management (AMB) (Signed)
Chronic Care Management Pharmacy Assistant   Name: Crystal Odom  MRN: [id number] DOB: [birth year]  Reason for Encounter: Disease State / Hypertension Assessment Call   Conditions to be addressed/monitored: HTN  Recent office visits:  10/10/2021 Shanon Ace MD - Patient was seen for suspected UTI. Started Macrobid 100 mg twice daily.  Return if symptoms worsen or fail to improve as expected  Recent consult visits:  None  Hospital visits:  None  Medications: Outpatient Encounter Medications as of 11/06/2021  Medication Sig   apixaban (ELIQUIS) 5 MG TABS tablet Take 1 tablet (5 mg total) by mouth 2 (two) times daily.   cholecalciferol (VITAMIN D3) 25 MCG (1000 UNIT) tablet Take 2,000 Units by mouth daily.    levothyroxine (SYNTHROID) 88 MCG tablet Take 1 tablet (88 mcg total) by mouth daily.   metoprolol succinate (TOPROL XL) 25 MG 24 hr tablet Take 0.5 tablets (12.5 mg total) by mouth 2 (two) times daily.   nitrofurantoin, macrocrystal-monohydrate, (MACROBID) 100 MG capsule Take 1 capsule (100 mg total) by mouth 2 (two) times daily. For UTI   pantoprazole (PROTONIX) 40 MG tablet Take 1 tablet (40 mg total) by mouth daily.   rosuvastatin (CRESTOR) 20 MG tablet Take 1 tablet (20 mg total) by mouth daily.   No facility-administered encounter medications on file as of 11/06/2021.  Fill History: ROSUVASTATIN CALCIUM 20 MG TAB 10/19/2021 90   PANTOPRAZOLE SOD DR 40 MG TAB 10/29/2021 90   METOPROLOL SUCCINATE 25 MG TABLET, EXTENDED RELEASE 24 HR 10/04/2021 90   LEVOTHYROXINE 88 MCG TABLET 08/07/2021 90   ELIQUIS 5 MG TABLET 10/27/2021 30   Reviewed chart prior to disease state call. Spoke with patient regarding BP  Recent Office Vitals: BP Readings from Last 3 Encounters:  10/10/21 136/72  08/22/21 138/74  08/07/21 140/86   Pulse Readings from Last 3 Encounters:  10/10/21 82  08/07/21 60  06/07/21 (!) 58    Wt Readings from Last 3 Encounters:  10/31/21 127 lb  (57.6 kg)  08/07/21 127 lb 3.2 oz (57.7 kg)  06/07/21 131 lb (59.4 kg)     Kidney Function Lab Results  Component Value Date/Time   CREATININE 0.68 10/18/2021 08:33 AM   CREATININE 0.63 08/07/2021 11:24 AM   CREATININE 0.64 06/22/2020 07:39 AM   GFR 85.12 08/07/2021 11:24 AM   GFRNONAA 86 06/22/2020 07:39 AM   GFRAA 100 06/22/2020 07:39 AM    BMP Latest Ref Rng & Units 10/18/2021 08/07/2021 06/22/2020  Glucose 70 - 99 mg/dL 86 90 83  BUN 8 - 27 mg/dL 20 19 18   Creatinine 0.57 - 1.00 mg/dL 0.68 0.63 0.64  BUN/Creat Ratio 12 - 28 40(J) - NOT APPLICABLE  Sodium 811 - 144 mmol/L 143 138 140  Potassium 3.5 - 5.2 mmol/L 4.7 4.0 4.8  Chloride 96 - 106 mmol/L 102 102 103  CO2 20 - 29 mmol/L 24 28 32  Calcium 8.7 - 10.3 mg/dL 9.6 9.6 9.7    Current antihypertensive regimen:  Metoprolol 25 mg 1/2 tablet twice daily  How often are you checking your Blood Pressure? Patient is checking her blood pressures daily.  Current home BP readings: Patient states her blood pressures have been running between 109/65 - 145/75  What recent interventions/DTPs have been made by any provider to improve Blood Pressure control since last CPP Visit: No recent interventions  Any recent hospitalizations or ED visits since last visit with CPP? No recent hospital visits.   What diet changes have been  made to improve Blood Pressure Control?  Patient is eating a low sodium Breakfast - Patient will have cereal, yogurt, eggs 3 times weekly or oatmeal Lunch - Patient will have a sandwich with leftovers  Dinner - Patient will have a meal to include a meat with a few different vegetables.  What exercise is being done to improve your Blood Pressure Control?  Patient states she is walking daily and going to silver sneaker, will do yoga and light weights a couple days per week.  Adherence Review: Is the patient currently on ACE/ARB medication? No Does the patient have >5 day gap between last estimated fill dates?  No  Care Gaps: AWV - completed 11/07/20 Last BP - 140/86 on 08/07/2021 Hepatitis C screening - never done  Star Rating Drugs: Rosuvastatin 5mg  - last filled on 10/19/2021 90DS at [place] Pharmacist Assistant [phone]

## 2021-11-07 DIAGNOSIS — M2242 Chondromalacia patellae, left knee: Secondary | ICD-10-CM | POA: Diagnosis not present

## 2021-11-07 DIAGNOSIS — M2241 Chondromalacia patellae, right knee: Secondary | ICD-10-CM | POA: Diagnosis not present

## 2021-12-04 NOTE — Progress Notes (Unsigned)
Cardiology Office Note:    Date:  12/04/2021   ID:  Crystal Odom, DOB 1941/07/22, MRN [id number]  PCP:  Burnis Medin, MD  [facility] Cardiologist:  Freada Bergeron, MD  [facility] Electrophysiologist:  None   Referring MD: Burnis Medin, MD    History of Present Illness:    Crystal Odom is a 81 y.o. female with a hx of HTN, mild AS and HLD who was recently diagnosed with Afib on cardiac monitor after presenting with palpitations who now presents to clinic for follow-up.  During our last visit on 07/07/20, the patient was having episodes of palpitations. We placed a cardiac monitor which showed episodes of Afib. We started her on apixaban at the time.  Today, the patient states that she has been doing overall well. She is concerned that her blood pressure and heart rate has been all over the place. Mainly her heart rates have been in the 50-70s but today it was in the 100s. The higher heart rates correlate with when she has mild palpitations and she think she is out of rhythm. Most episodes of her Afib last about 42mn before resolving. Fortunately, she has minimal symptoms when she is out of rhythm with only the slight sensation of palpitations. No lightheadedness, chest pain, shortness of breath.  Tolerating apixaban without any bleeding issues. Blood pressures mainly 100s-110s. Currently taking metop 12.'5mg'$  XL daily.  Cardiac CT calcium score was 0 in 2017. She remains active and walks daily for exercise. No limitations.   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 neutropenia   Arthritis    left knee   Diplopia 2002   eval neg   Hyperlipidemia    HDL over 100   Hypothyroidism    Kidney stones    in past   Leukopenia    with neg hemevaluation bm bx get wbc diff q 6 months   UTI (lower urinary tract infection)     Past Surgical History:  Procedure Laterality Date   BONE MARROW  BIOPSY     low WBC   CATARACT EXTRACTION, BILATERAL     kidney stone removal     left arthroscopic  2005   left knee   LIVER BIOPSY     reported normal   TONSILLECTOMY     TUBAL LIGATION      Current Medications: No outpatient medications have been marked as taking for the 12/17/21 encounter (Appointment) with PFreada Bergeron MD.     Allergies:   Penicillins   Social History   Socioeconomic History   Marital status: Married    Spouse name: Not on file   Number of children: Not on file   Years of education: Not on file   Highest education level: Bachelor's degree (e.g., BA, AB, BS)  Occupational History   Not on file  Tobacco Use   Smoking status: Never   Smokeless tobacco: Never  Vaping Use   Vaping Use: Never used  Substance and Sexual Activity   Alcohol use: Yes    Alcohol/week: 2.0 - 3.0 standard drinks    Types: 2 - 3 Glasses of wine per week    Comment: 2 to 3 glasses a week    Drug use: No   Sexual activity: Not on file  Other Topics Concern   Not on file  Social History Narrative   Married   Retired  2008  Regular exercise-yes   HH of 2   No pets   Social etoh   G2P2      Husband hac CABG 2012   Travel grandchildren    Social Determinants of Health   Financial Resource Strain: Low Risk    Difficulty of Paying Living Expenses: Not hard at all  Food Insecurity: No Food Insecurity   Worried About Charity fundraiser in the Last Year: Never true   Arboriculturist in the Last Year: Never true  Transportation Needs: No Transportation Needs   Lack of Transportation (Medical): No   Lack of Transportation (Non-Medical): No  Physical Activity: Sufficiently Active   Days of Exercise per Week: 7 days   Minutes of Exercise per Session: 30 min  Stress: No Stress Concern Present   Feeling of Stress : Not at all  Social Connections: Socially Integrated   Frequency of Communication with Friends and Family: Three times a week   Frequency of Social  Gatherings with Friends and Family: Once a week   Attends Religious Services: More than 4 times per year   Active Member of [employer] or Organizations: Yes   Attends Music therapist: More than 4 times per year   Marital Status: Married     Family History: The patient's family history includes Autoimmune disease in her brother and sister; Heart disease in her father and mother; Osteoarthritis in her father; Osteoporosis in an other family member; Other in her father; Stroke in her maternal grandmother and another family member; Thyroid disease in an other family member.  ROS:   Please see the history of present illness.    Review of Systems  Constitutional:  Negative for chills and fever.  HENT:  Negative for nosebleeds.   Eyes:  Negative for blurred vision.  Respiratory:  Negative for shortness of breath.   Cardiovascular:  Positive for palpitations. Negative for chest pain, orthopnea, claudication, leg swelling and PND.  Gastrointestinal:  Negative for melena, nausea and vomiting.  Genitourinary:  Negative for dysuria and hematuria.  Musculoskeletal:  Negative for falls.  Neurological:  Negative for dizziness and loss of consciousness.  Endo/Heme/Allergies:  Negative for polydipsia.  Psychiatric/Behavioral:  Negative for substance abuse. The patient is not nervous/anxious.    EKGs/Labs/Other Studies Reviewed:    The following studies were reviewed today: 08/01/20 Cardiac Monitor: Predominant rhythm is sinus with average HR 62bpm. HR range 38bpm to 220bpm. There were 59 episodes of SVT with fastest 179bpm lasting 7 beats. Longest episode lasted 15.6 secs with average HR 101bpm. There were several runs of Afib (<1% burden) with longest episode lasting 35mn and 10sec with average HR 178. Patient symptomatic at that time. Isolated PACs and PVCs were rare (<1% burden). No NSVT, VT or significant pauses.  TTE 06/11/19: IMPRESSIONS   1. The left ventricle has hyperdynamic  systolic function, with an  ejection fraction of >65%. The cavity size was normal. There is mild  eccentric left ventricular hypertrophy. Left ventricular diastolic Doppler  parameters are indeterminate. The E/e' is  9. No evidence of left ventricular regional wall motion abnormalities.   2. The right ventricle has normal systolic function. The cavity was  normal. There is no increase in right ventricular wall thickness. Right  ventricular systolic pressure is normal.   3. There is systolic bowing of the mitral leaflets, without true  prolapse.   4. The aortic valve is tricuspid. Mild thickening of the aortic valve.  Sclerosis without any evidence of  stenosis of the aortic valve. Mild  stenosis of the aortic valve.   5. Focal calcification of noncoronary cusp.   6. The aorta is normal unless otherwise noted.   7. The aortic root, ascending aorta and aortic arch are normal in size  and structure.   EKG:  EKG is  ordered today.  The ekg ordered today demonstrates Afib with HR 116, mild STD in V4-V6, II and aVF  Recent Labs: 08/07/2021: Hemoglobin 14.0; Platelets 257.0; TSH 1.14 10/18/2021: ALT 30; BUN 20; Creatinine, Ser 0.68; Potassium 4.7; Sodium 143  Recent Lipid Panel    Component Value Date/Time   CHOL 224 (H) 10/18/2021 0833   TRIG 65 10/18/2021 0833   HDL 103 10/18/2021 0833   CHOLHDL 2.2 10/18/2021 0833   CHOLHDL 2 08/07/2021 1124   VLDL 12.8 08/07/2021 1124   LDLCALC 110 (H) 10/18/2021 0833   LDLCALC 161 (H) 06/22/2020 0739   LDLDIRECT 149.6 02/02/2013 1003     Risk Assessment/Calculations:    CHA2DS2-VASc Score =    {This indicates a  % annual risk of stroke. The patient's score is based upon:      Physical Exam:    VS:  There were no vitals taken for this visit.    Wt Readings from Last 3 Encounters:  10/31/21 127 lb (57.6 kg)  08/07/21 127 lb 3.2 oz (57.7 kg)  06/07/21 131 lb (59.4 kg)     GEN:  Well nourished, well developed in no acute  distress HEENT: Normal NECK: No JVD; No carotid bruits CARDIAC: Irregularly irregular, no murmurs, rubs, gallops RESPIRATORY:  Clear to auscultation without rales, wheezing or rhonchi  ABDOMEN: Soft, non-tender, non-distended MUSCULOSKELETAL:  No edema; No deformity  SKIN: Warm and dry NEUROLOGIC:  Alert and oriented x 3 PSYCHIATRIC:  Normal affect   ASSESSMENT:    No diagnosis found.  PLAN:    In order of problems listed above:  #Newly Diagnosed Paroxysmal Afib Zio patch placed for palpitations revealed runs of Afib. Started on [facility] with apixaban and metop for rate control. Continues to have runs of Afib at home with mild symptoms of palpitations. No lightheadedness, dizziness, chest pain or SOB. Currently in Afib today with HR 116. -Continue apixaban '5mg'$  BID -Increase metoprolol to 12.'5mg'$  BID (only take if SBP>100) -Will start nexium daily for gastric protection -If maintaining HR >120 for >2 hours, can take an additional 12.'5mg'$  of metop -Discussed that if she becomes symptomatic with worsening SOB, lightheadedness, dizziness, chest pain or hypotension--needs to go to ER to be evaluated -TTE in 2020 with normal LVEF, mild AS, no other significant valvular disease  #Orthostasis: #Lightheadedness: Resolved with stopping the lisinopril.  -TTE 06/2019 with normal BiV function, mild LVH, mild AS -Stopped lisinopril -Counseled about adequate fluid and salt intake -Compression therapy with stockings and/or abdominal binder -Counseled about countermaneuvers including laying down with her legs up if she feels like she is going to pass out     #Elevated LDL: LDL 161, HDL 101. ASCVD risk score 15.2%. Coronary calcium score in 2017 0.  -Continue Crestor '5mg'$  PO daily -Check lipids today   #HTN: Well controlled. -Continue metop for rate control as above -Off lisinopril due to orthostasis symptoms   Medication Adjustments/Labs and Tests Ordered: Current medicines are reviewed at  length with the patient today.  Concerns regarding medicines are outlined above.  No orders of the defined types were placed in this encounter.  No orders of the defined types were placed in this encounter.  There are no Patient Instructions on file for this visit.    Signed, Freada Bergeron, MD  12/04/2021 5:18 PM    [place]

## 2021-12-10 ENCOUNTER — Other Ambulatory Visit: Payer: Self-pay

## 2021-12-10 ENCOUNTER — Encounter: Payer: Self-pay | Admitting: Cardiology

## 2021-12-10 DIAGNOSIS — I480 Paroxysmal atrial fibrillation: Secondary | ICD-10-CM

## 2021-12-10 DIAGNOSIS — Z7901 Long term (current) use of anticoagulants: Secondary | ICD-10-CM

## 2021-12-11 ENCOUNTER — Other Ambulatory Visit: Payer: Self-pay | Admitting: Family

## 2021-12-11 DIAGNOSIS — Z7901 Long term (current) use of anticoagulants: Secondary | ICD-10-CM | POA: Diagnosis not present

## 2021-12-11 DIAGNOSIS — E7800 Pure hypercholesterolemia, unspecified: Secondary | ICD-10-CM | POA: Diagnosis not present

## 2021-12-11 DIAGNOSIS — I480 Paroxysmal atrial fibrillation: Secondary | ICD-10-CM | POA: Diagnosis not present

## 2021-12-11 DIAGNOSIS — Z1159 Encounter for screening for other viral diseases: Secondary | ICD-10-CM | POA: Diagnosis not present

## 2021-12-12 ENCOUNTER — Encounter (HOSPITAL_BASED_OUTPATIENT_CLINIC_OR_DEPARTMENT_OTHER): Payer: Self-pay

## 2021-12-12 LAB — HEPATIC FUNCTION PANEL
ALT: 46 IU/L — ABNORMAL HIGH (ref 0–32)
AST: 41 IU/L — ABNORMAL HIGH (ref 0–40)
Albumin: 4.3 g/dL (ref 3.7–4.7)
Alkaline Phosphatase: 77 IU/L (ref 44–121)
Bilirubin Total: 0.4 mg/dL (ref 0.0–1.2)
Bilirubin, Direct: 0.13 mg/dL (ref 0.00–0.40)
Total Protein: 6.6 g/dL (ref 6.0–8.5)

## 2021-12-12 LAB — CBC
Hematocrit: 40.6 % (ref 34.0–46.6)
Hemoglobin: 14 g/dL (ref 11.1–15.9)
MCH: 32.2 pg (ref 26.6–33.0)
MCHC: 34.5 g/dL (ref 31.5–35.7)
MCV: 93 fL (ref 79–97)
Platelets: 320 10*3/uL (ref 150–450)
RBC: 4.35 x10E6/uL (ref 3.77–5.28)
RDW: 12.8 % (ref 11.7–15.4)
WBC: 3.5 10*3/uL (ref 3.4–10.8)

## 2021-12-12 LAB — LIPID PANEL
Chol/HDL Ratio: 2.2 ratio (ref 0.0–4.4)
Cholesterol, Total: 230 mg/dL — ABNORMAL HIGH (ref 100–199)
HDL: 105 mg/dL (ref >39)
LDL Chol Calc (NIH): 114 mg/dL — ABNORMAL HIGH (ref 0–99)
Triglycerides: 65 mg/dL (ref 0–149)
VLDL Cholesterol Cal: 11 mg/dL (ref 5–40)

## 2021-12-17 ENCOUNTER — Ambulatory Visit: Payer: PPO | Admitting: Cardiology

## 2021-12-17 ENCOUNTER — Encounter: Payer: Self-pay | Admitting: Cardiology

## 2021-12-17 ENCOUNTER — Other Ambulatory Visit: Payer: Self-pay

## 2021-12-17 VITALS — BP 120/80 | HR 71 | Ht 64.0 in | Wt 131.0 lb

## 2021-12-17 DIAGNOSIS — I10 Essential (primary) hypertension: Secondary | ICD-10-CM | POA: Diagnosis not present

## 2021-12-17 DIAGNOSIS — I350 Nonrheumatic aortic (valve) stenosis: Secondary | ICD-10-CM | POA: Diagnosis not present

## 2021-12-17 DIAGNOSIS — Z7901 Long term (current) use of anticoagulants: Secondary | ICD-10-CM | POA: Diagnosis not present

## 2021-12-17 DIAGNOSIS — R42 Dizziness and giddiness: Secondary | ICD-10-CM

## 2021-12-17 DIAGNOSIS — Z79899 Other long term (current) drug therapy: Secondary | ICD-10-CM

## 2021-12-17 DIAGNOSIS — E7800 Pure hypercholesterolemia, unspecified: Secondary | ICD-10-CM

## 2021-12-17 DIAGNOSIS — I480 Paroxysmal atrial fibrillation: Secondary | ICD-10-CM

## 2021-12-17 MED ORDER — EZETIMIBE 10 MG PO TABS: 10.0000 mg | ORAL_TABLET | Freq: Every day | ORAL | 3 refills | Status: DC

## 2021-12-17 NOTE — Progress Notes (Signed)
?Cardiology Office Note:   ? ?Date:  12/17/2021  ? ?ID:  Crystal Odom, DOB Sep 07, 1941, MRN [id number] ? ?PCP:  Burnis Medin, MD  ?[facility] Cardiologist:  Freada Bergeron, MD  ?[facility] Electrophysiologist:  None  ? ?Referring MD: Burnis Medin, MD  ? ? ?History of Present Illness:   ? ?Crystal Odom is a 81 y.o. female with a hx of HTN, mild AS and HLD who was recently diagnosed with Afib on cardiac monitor after presenting with palpitations who now presents to clinic for follow-up. ? ?Per review of the record patient has Calcium score of 0 in 2017.  Most recent echocardiogram 06/11/2019 with normal LVEF greater than 65%, indeterminate diastolic parameters, no R WMA, RV normal size and function, subtle MVP with trivial MR, mild aortic sclerosis.  Evaluated October 2021 due to palpitations.  Cardiac monitor was placed which showed episodes of atrial fibrillation.  She was started on Eliquis at that time. ?  ?Last saw Laurann Montana, NP in 05/2021 where she was doing well. No CV complaints at that time. ? ?Today, she is doing well and accompanied by her husband.  She states she has been doing very well. Occasional, rare palpitations or rapid HR but no sustained symptoms. She is tolerating her medications. No bleeding with apixaban ? ?She is currently taking 10 mg of Crestor. Saw no change in LDL with increased dose and LFTs rose. She was recommended to decrease dosing back to $Remov'10mg'nnkiBa$  daily.  She wonders if Zetia is a good option for her. She stays active by walking every morning and participating in [employer] and yoga with her husband. No anginal symptoms.  ? ?She denies chest pain, chest pressure, dyspnea at rest or with exertion, PND, orthopnea, or leg swelling. Denies cough, fever, chills. Denies nausea, vomiting. Denies syncope or presyncope. Denies dizziness or lightheadedness. Denies snoring.  ? ?Past Medical History:  ?Diagnosis Date  ? Abnormal LFTs   ? nl liver bx positive  antismooth muscle antibodies and neutropenia  ? Arthritis   ? left knee  ? Diplopia 2002  ? eval neg  ? Hyperlipidemia   ? HDL over 100  ? Hypothyroidism   ? Kidney stones   ? in past  ? Leukopenia   ? with neg hemevaluation bm bx get wbc diff q 6 months  ? UTI (lower urinary tract infection)   ? ? ?Past Surgical History:  ?Procedure Laterality Date  ? BONE MARROW BIOPSY    ? low WBC  ? CATARACT EXTRACTION, BILATERAL    ? kidney stone removal    ? left arthroscopic  2005  ? left knee  ? LIVER BIOPSY    ? reported normal  ? TONSILLECTOMY    ? TUBAL LIGATION    ? ? ?Current Medications: ?Current Meds  ?Medication Sig  ? apixaban (ELIQUIS) 5 MG TABS tablet Take 1 tablet (5 mg total) by mouth 2 (two) times daily.  ? Calcium Carbonate-Vitamin D (CALCIUM-VITAMIN D3 PO) Take 500 mg by mouth in the morning and at bedtime.  ? ezetimibe (ZETIA) 10 MG tablet Take 1 tablet (10 mg total) by mouth daily.  ? levothyroxine (SYNTHROID) 88 MCG tablet Take 1 tablet (88 mcg total) by mouth daily.  ? metoprolol succinate (TOPROL XL) 25 MG 24 hr tablet Take 0.5 tablets (12.5 mg total) by mouth 2 (two) times daily.  ? rosuvastatin (CRESTOR) 20 MG tablet Take 1 tablet (20 mg total) by mouth daily. (Patient taking differently: Take  10 mg by mouth daily.)  ?  ? ?Allergies:   Penicillins  ? ?Social History  ? ?Socioeconomic History  ? Marital status: Married  ?  Spouse name: Not on file  ? Number of children: Not on file  ? Years of education: Not on file  ? Highest education level: Bachelor's degree (e.g., BA, AB, BS)  ?Occupational History  ? Not on file  ?Tobacco Use  ? Smoking status: Never  ? Smokeless tobacco: Never  ?Vaping Use  ? Vaping Use: Never used  ?Substance and Sexual Activity  ? Alcohol use: Yes  ?  Alcohol/week: 2.0 - 3.0 standard drinks  ?  Types: 2 - 3 Glasses of wine per week  ?  Comment: 2 to 3 glasses a week   ? Drug use: No  ? Sexual activity: Not on file  ?Other Topics Concern  ? Not on file  ?Social History Narrative   ? Married  ? Retired  2008  ? Regular exercise-yes  ? HH of 2  ? No pets  ? Social etoh  ? G2P2  ?   ? Husband hac CABG 2012  ? Travel grandchildren   ? ?Social Determinants of Health  ? ?Financial Resource Strain: Low Risk   ? Difficulty of Paying Living Expenses: Not hard at all  ?Food Insecurity: No Food Insecurity  ? Worried About Charity fundraiser in the Last Year: Never true  ? Ran Out of Food in the Last Year: Never true  ?Transportation Needs: No Transportation Needs  ? Lack of Transportation (Medical): No  ? Lack of Transportation (Non-Medical): No  ?Physical Activity: Sufficiently Active  ? Days of Exercise per Week: 7 days  ? Minutes of Exercise per Session: 30 min  ?Stress: No Stress Concern Present  ? Feeling of Stress : Not at all  ?Social Connections: Socially Integrated  ? Frequency of Communication with Friends and Family: Three times a week  ? Frequency of Social Gatherings with Friends and Family: Once a week  ? Attends Religious Services: More than 4 times per year  ? Active Member of Clubs or Organizations: Yes  ? Attends Archivist Meetings: More than 4 times per year  ? Marital Status: Married  ?  ? ?Family History: ?The patient's family history includes Autoimmune disease in her brother and sister; Heart disease in her father and mother; Osteoarthritis in her father; Osteoporosis in an other family member; Other in her father; Stroke in her maternal grandmother and another family member; Thyroid disease in an other family member. ? ?ROS:   ?Please see the history of present illness.    ?Review of Systems  ?Constitutional:  Negative for chills and fever.  ?HENT:  Negative for nosebleeds.   ?Eyes:  Negative for blurred vision.  ?Respiratory:  Negative for shortness of breath.   ?Cardiovascular:  Positive for palpitations. Negative for chest pain, orthopnea, claudication, leg swelling and PND.  ?Gastrointestinal:  Negative for blood in stool and melena.  ?Genitourinary:  Negative  for dysuria and hematuria.  ?Musculoskeletal:  Positive for joint pain (Bilateral knees).  ?Skin:  Negative for itching and rash.  ?Neurological:  Negative for dizziness and loss of consciousness.  ?Endo/Heme/Allergies:  Negative for polydipsia.  ?Psychiatric/Behavioral:  Negative for substance abuse. The patient is not nervous/anxious.   ? ?EKGs/Labs/Other Studies Reviewed:   ? ?The following studies were reviewed today: ?08/01/20 Cardiac Monitor: ?Predominant rhythm is sinus with average HR 62bpm. HR range 38bpm to 220bpm. ?There  were 59 episodes of SVT with fastest 179bpm lasting 7 beats. Longest episode lasted 15.6 secs with average HR 101bpm. ?There were several runs of Afib (<1% burden) with longest episode lasting 32min and 10sec with average HR 178. Patient symptomatic at that time. ?Isolated PACs and PVCs were rare (<1% burden). No NSVT, VT or significant pauses. ? ?TTE 06/11/19: ?IMPRESSIONS  ? 1. The left ventricle has hyperdynamic systolic function, with an  ?ejection fraction of >65%. The cavity size was normal. There is mild  ?eccentric left ventricular hypertrophy. Left ventricular diastolic Doppler  ?parameters are indeterminate. The E/e' is  ?9. No evidence of left ventricular regional wall motion abnormalities.  ? 2. The right ventricle has normal systolic function. The cavity was  ?normal. There is no increase in right ventricular wall thickness. Right  ?ventricular systolic pressure is normal.  ? 3. There is systolic bowing of the mitral leaflets, without true  ?prolapse.  ? 4. The aortic valve is tricuspid. Mild thickening of the aortic valve.  ?Sclerosis without any evidence of stenosis of the aortic valve. Mild  ?stenosis of the aortic valve.  ? 5. Focal calcification of noncoronary cusp.  ? 6. The aorta is normal unless otherwise noted.  ? 7. The aortic root, ascending aorta and aortic arch are normal in size  ?and structure.  ? ?EKG:  EKG was not ordered today ?10/31/20: Afib with HR 116, mild  STD in V4-V6, II and aVF ? ?Recent Labs: ?08/07/2021: TSH 1.14 ?10/18/2021: BUN 20; Creatinine, Ser 0.68; Potassium 4.7; Sodium 143 ?12/11/2021: ALT 46; Hemoglobin 14.0; Platelets 320  ?Recent Lipid Panel ?

## 2021-12-17 NOTE — Patient Instructions (Signed)
Medication Instructions:  ?Your physician has recommended you make the following change in your medication:  ? ?1) START ezetimibe (Zetia) '10mg'$  once daily ? ?*If you need a refill on your cardiac medications before your next appointment, please call your pharmacy* ? ?Lab Work: ?In 6 weeks: Lipids, LFTs ?If you have labs (blood work) drawn today and your tests are completely normal, you will receive your results only by: ?MyChart Message (if you have MyChart) OR ?A paper copy in the mail ?If you have any lab test that is abnormal or we need to change your treatment, we will call you to review the results. ? ?Testing/Procedures: ?NONE ? ?Follow-Up: ?At [facility], you and your health needs are our priority.  As part of our continuing mission to provide you with exceptional heart care, we have created designated Provider Care Teams.  These Care Teams include your primary Cardiologist (physician) and Advanced Practice Providers (APPs -  Physician Assistants and Nurse Practitioners) who all work together to provide you with the care you need, when you need it. ? ?Your next appointment:   ?6 month(s) ? ?The format for your next appointment:   ?In Person ? ?Provider:   ?Freada Bergeron, MD ?

## 2021-12-20 DIAGNOSIS — Z961 Presence of intraocular lens: Secondary | ICD-10-CM | POA: Diagnosis not present

## 2021-12-20 DIAGNOSIS — H02831 Dermatochalasis of right upper eyelid: Secondary | ICD-10-CM | POA: Diagnosis not present

## 2021-12-20 DIAGNOSIS — H18413 Arcus senilis, bilateral: Secondary | ICD-10-CM | POA: Diagnosis not present

## 2021-12-20 DIAGNOSIS — H26491 Other secondary cataract, right eye: Secondary | ICD-10-CM | POA: Diagnosis not present

## 2021-12-20 DIAGNOSIS — H26493 Other secondary cataract, bilateral: Secondary | ICD-10-CM | POA: Diagnosis not present

## 2021-12-21 ENCOUNTER — Other Ambulatory Visit (HOSPITAL_BASED_OUTPATIENT_CLINIC_OR_DEPARTMENT_OTHER): Payer: Self-pay | Admitting: Family

## 2021-12-21 NOTE — Telephone Encounter (Signed)
Eliquis 5 mg refill request received. Patient is 81 years old, weight- 59.4 kg, Crea- 0.68 on 10/18/21, Diagnosis- PAF, and last seen by Dr. Johney Frame on 12/17/21. Dose is appropriate based on dosing criteria. Will send in refill to requested pharmacy.   ?

## 2021-12-27 DIAGNOSIS — Z9841 Cataract extraction status, right eye: Secondary | ICD-10-CM | POA: Diagnosis not present

## 2021-12-27 DIAGNOSIS — H5203 Hypermetropia, bilateral: Secondary | ICD-10-CM | POA: Diagnosis not present

## 2021-12-27 DIAGNOSIS — H524 Presbyopia: Secondary | ICD-10-CM | POA: Diagnosis not present

## 2021-12-27 DIAGNOSIS — H52223 Regular astigmatism, bilateral: Secondary | ICD-10-CM | POA: Diagnosis not present

## 2021-12-31 DIAGNOSIS — L720 Epidermal cyst: Secondary | ICD-10-CM | POA: Diagnosis not present

## 2022-01-04 ENCOUNTER — Encounter: Payer: Self-pay | Admitting: Internal Medicine

## 2022-01-28 DIAGNOSIS — E7800 Pure hypercholesterolemia, unspecified: Secondary | ICD-10-CM | POA: Diagnosis not present

## 2022-01-28 DIAGNOSIS — Z79899 Other long term (current) drug therapy: Secondary | ICD-10-CM | POA: Diagnosis not present

## 2022-01-29 LAB — LIPID PANEL
Chol/HDL Ratio: 1.9 ratio (ref 0.0–4.4)
Cholesterol, Total: 179 mg/dL (ref 100–199)
HDL: 95 mg/dL (ref >39)
LDL Chol Calc (NIH): 74 mg/dL (ref 0–99)
Triglycerides: 46 mg/dL (ref 0–149)
VLDL Cholesterol Cal: 10 mg/dL (ref 5–40)

## 2022-01-29 LAB — HEPATIC FUNCTION PANEL
ALT: 49 IU/L — ABNORMAL HIGH (ref 0–32)
AST: 43 IU/L — ABNORMAL HIGH (ref 0–40)
Albumin: 4 g/dL (ref 3.7–4.7)
Alkaline Phosphatase: 64 IU/L (ref 44–121)
Bilirubin Total: 0.4 mg/dL (ref 0.0–1.2)
Bilirubin, Direct: 0.16 mg/dL (ref 0.00–0.40)
Total Protein: 6.4 g/dL (ref 6.0–8.5)

## 2022-02-11 ENCOUNTER — Telehealth: Payer: Self-pay | Admitting: Pharmacist

## 2022-02-11 NOTE — Progress Notes (Signed)
Done in error.

## 2022-03-19 ENCOUNTER — Telehealth: Payer: PPO

## 2022-04-22 DIAGNOSIS — M1711 Unilateral primary osteoarthritis, right knee: Secondary | ICD-10-CM | POA: Diagnosis not present

## 2022-04-27 DIAGNOSIS — Z20822 Contact with and (suspected) exposure to covid-19: Secondary | ICD-10-CM | POA: Diagnosis not present

## 2022-04-28 ENCOUNTER — Encounter: Payer: Self-pay | Admitting: Internal Medicine

## 2022-05-04 DIAGNOSIS — Z20822 Contact with and (suspected) exposure to covid-19: Secondary | ICD-10-CM | POA: Diagnosis not present

## 2022-05-08 DIAGNOSIS — Z20822 Contact with and (suspected) exposure to covid-19: Secondary | ICD-10-CM | POA: Diagnosis not present

## 2022-05-15 DIAGNOSIS — Z20822 Contact with and (suspected) exposure to covid-19: Secondary | ICD-10-CM | POA: Diagnosis not present

## 2022-05-21 ENCOUNTER — Encounter: Payer: Self-pay | Admitting: Internal Medicine

## 2022-05-21 NOTE — Telephone Encounter (Signed)
Glad you are better,:if you have no symptoms and now test negative you should be okay to do normal activity and should not be spreading the virus.  Do not think you are going to get it again in the near future and do not have to do strict isolation from your husband.  Not sure masking would really help decrease transmission living inside the same residence anyway.  Interesting that you both had rebound   The only purpose of retesting it was if you feel bad and want to know if it is probably from COVID infection or you are going into a high risk situation i.e. with the suppressed person hat you are less likely to be contagious.  Otherwise we do not usually advise COVID retesting.  Hopefully you will be somewhat protected for the next wave of COVID at least for next 6 to 12 months.  I would advise against getting a COVID booster for that time unless more information comes out that says otherwise.

## 2022-05-22 NOTE — Telephone Encounter (Signed)
Yes  advise both   flu and rsv but maybe not until fall October so  effectiveness  covers the winter season.

## 2022-06-05 DIAGNOSIS — D224 Melanocytic nevi of scalp and neck: Secondary | ICD-10-CM | POA: Diagnosis not present

## 2022-06-05 DIAGNOSIS — D2271 Melanocytic nevi of right lower limb, including hip: Secondary | ICD-10-CM | POA: Diagnosis not present

## 2022-06-05 DIAGNOSIS — D225 Melanocytic nevi of trunk: Secondary | ICD-10-CM | POA: Diagnosis not present

## 2022-06-05 DIAGNOSIS — L821 Other seborrheic keratosis: Secondary | ICD-10-CM | POA: Diagnosis not present

## 2022-06-05 DIAGNOSIS — D2262 Melanocytic nevi of left upper limb, including shoulder: Secondary | ICD-10-CM | POA: Diagnosis not present

## 2022-06-05 DIAGNOSIS — L812 Freckles: Secondary | ICD-10-CM | POA: Diagnosis not present

## 2022-06-05 DIAGNOSIS — L820 Inflamed seborrheic keratosis: Secondary | ICD-10-CM | POA: Diagnosis not present

## 2022-06-05 DIAGNOSIS — D1801 Hemangioma of skin and subcutaneous tissue: Secondary | ICD-10-CM | POA: Diagnosis not present

## 2022-06-05 DIAGNOSIS — D2371 Other benign neoplasm of skin of right lower limb, including hip: Secondary | ICD-10-CM | POA: Diagnosis not present

## 2022-07-15 ENCOUNTER — Other Ambulatory Visit: Payer: Self-pay | Admitting: *Deleted

## 2022-07-15 DIAGNOSIS — I480 Paroxysmal atrial fibrillation: Secondary | ICD-10-CM

## 2022-07-15 MED ORDER — APIXABAN 5 MG PO TABS: 5.0000 mg | ORAL_TABLET | Freq: Two times a day (BID) | ORAL | 5 refills | Status: DC

## 2022-07-15 NOTE — Telephone Encounter (Signed)
Eliquis '5mg'$  refill request received. Patient is 81 years old, weight-59.4kg, Crea-0.68 on 10/18/2021, Diagnosis-Afib, and last seen by Dr. Johney Frame on 12/17/2021. Dose is appropriate based on dosing criteria. Will send in refill to requested pharmacy.

## 2022-07-19 ENCOUNTER — Other Ambulatory Visit: Payer: Self-pay | Admitting: Internal Medicine

## 2022-08-05 ENCOUNTER — Other Ambulatory Visit: Payer: Self-pay | Admitting: Cardiology

## 2022-08-05 DIAGNOSIS — E7800 Pure hypercholesterolemia, unspecified: Secondary | ICD-10-CM | POA: Diagnosis not present

## 2022-08-06 LAB — HEPATIC FUNCTION PANEL
ALT: 44 IU/L — ABNORMAL HIGH (ref 0–32)
AST: 40 IU/L (ref 0–40)
Albumin: 4.3 g/dL (ref 3.8–4.8)
Alkaline Phosphatase: 79 IU/L (ref 44–121)
Bilirubin Total: 0.6 mg/dL (ref 0.0–1.2)
Bilirubin, Direct: 0.19 mg/dL (ref 0.00–0.40)
Total Protein: 7.1 g/dL (ref 6.0–8.5)

## 2022-08-06 LAB — LIPID PANEL
Chol/HDL Ratio: 1.9 ratio (ref 0.0–4.4)
Cholesterol, Total: 195 mg/dL (ref 100–199)
HDL: 104 mg/dL (ref >39)
LDL Chol Calc (NIH): 80 mg/dL (ref 0–99)
Triglycerides: 60 mg/dL (ref 0–149)
VLDL Cholesterol Cal: 11 mg/dL (ref 5–40)

## 2022-08-12 NOTE — Progress Notes (Unsigned)
Cardiology Office Note:    Date:  08/12/2022   ID:  [name], DOB July 30, 1941, MRN [id number]  PCP:  Burnis Medin, MD  [facility] Cardiologist:  Freada Bergeron, MD  [facility] Electrophysiologist:  None   Referring MD: Burnis Medin, MD    History of Present Illness:    Crystal Odom is a 81 y.o. female with a hx of HTN, mild AS, HLD and pAfib who returns to clinic for follow-up  Per review of the record patient has Calcium score of 0 in 2017.  Most recent echocardiogram 06/11/2019 with normal LVEF greater than 65%, indeterminate diastolic parameters, no R WMA, RV normal size and function, subtle MVP with trivial MR, mild aortic sclerosis.  Evaluated October 2021 due to palpitations.  Cardiac monitor was placed which showed episodes of atrial fibrillation.  She was started on Eliquis at that time.   Was last seen in clinic on 11/2021 where she was doing very well. Rare palpitations. Started zetia for HLD.   Today, ***   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 neutropenia   Arthritis    left knee   Diplopia 2002   eval neg   Hyperlipidemia    HDL over 100   Hypothyroidism    Kidney stones    in past   Leukopenia    with neg hemevaluation bm bx get wbc diff q 6 months   UTI (lower urinary tract infection)     Past Surgical History:  Procedure Laterality Date   BONE MARROW BIOPSY     low WBC   CATARACT EXTRACTION, BILATERAL     kidney stone removal     left arthroscopic  2005   left knee   LIVER BIOPSY     reported normal   TONSILLECTOMY     TUBAL LIGATION      Current Medications: No outpatient medications have been marked as taking for the 08/15/22 encounter (Appointment) with Freada Bergeron, MD.     Allergies:   Penicillins   Social History   Socioeconomic History   Marital status: Married    Spouse name: Not on file   Number of children:  Not on file   Years of education: Not on file   Highest education level: Bachelor's degree (e.g., BA, AB, BS)  Occupational History   Not on file  Tobacco Use   Smoking status: Never   Smokeless tobacco: Never  Vaping Use   Vaping Use: Never used  Substance and Sexual Activity   Alcohol use: Yes    Alcohol/week: 2.0 - 3.0 standard drinks of alcohol    Types: 2 - 3 Glasses of wine per week    Comment: 2 to 3 glasses a week    Drug use: No   Sexual activity: Not on file  Other Topics Concern   Not on file  Social History Narrative   Married   Retired  2008   Regular exercise-yes   HH of 2   No pets   Social etoh   [place] CABG 2012   Travel grandchildren    Social Determinants of Health   Financial Resource Strain: Low Risk  (10/31/2021)   Overall Financial Resource Strain (CARDIA)    Difficulty of Paying Living Expenses: Not hard at all  Food Insecurity: No Food Insecurity (10/31/2021)   Hunger Vital Sign  Worried About Charity fundraiser in the Last Year: Never true    [place] in the Last Year: Never true  Transportation Needs: No Transportation Needs (10/31/2021)   PRAPARE - Hydrologist (Medical): No    Lack of Transportation (Non-Medical): No  Physical Activity: Sufficiently Active (10/31/2021)   Exercise Vital Sign    Days of Exercise per Week: 7 days    Minutes of Exercise per Session: 30 min  Stress: No Stress Concern Present (10/31/2021)   [place]    Feeling of Stress : Not at all  Social Connections: Socially Integrated (10/31/2021)   Social Connection and Isolation Panel [NHANES]    Frequency of Communication with Friends and Family: Three times a week    Frequency of Social Gatherings with Friends and Family: Once a week    Attends Religious Services: More than 4 times per year    Active Member of [employer] or Organizations: Yes    Attends Arts development officer: More than 4 times per year    Marital Status: Married     Family History: The patient's family history includes Autoimmune disease in her brother and sister; Heart disease in her father and mother; Osteoarthritis in her father; Osteoporosis in an other family member; Other in her father; Stroke in her maternal grandmother and another family member; Thyroid disease in an other family member.  ROS:   Please see the history of present illness.    Review of Systems  Constitutional:  Negative for chills and fever.  HENT:  Negative for nosebleeds.   Eyes:  Negative for blurred vision.  Respiratory:  Negative for shortness of breath.   Cardiovascular:  Positive for palpitations. Negative for chest pain, orthopnea, claudication, leg swelling and PND.  Gastrointestinal:  Negative for blood in stool and melena.  Genitourinary:  Negative for dysuria and hematuria.  Musculoskeletal:  Positive for joint pain (Bilateral knees).  Skin:  Negative for itching and rash.  Neurological:  Negative for dizziness and loss of consciousness.  Endo/Heme/Allergies:  Negative for polydipsia.  Psychiatric/Behavioral:  Negative for substance abuse. The patient is not nervous/anxious.     EKGs/Labs/Other Studies Reviewed:    The following studies were reviewed today: 08/01/20 Cardiac Monitor: Predominant rhythm is sinus with average HR 62bpm. HR range 38bpm to 220bpm. There were 59 episodes of SVT with fastest 179bpm lasting 7 beats. Longest episode lasted 15.6 secs with average HR 101bpm. There were several runs of Afib (<1% burden) with longest episode lasting 19mn and 10sec with average HR 178. Patient symptomatic at that time. Isolated PACs and PVCs were rare (<1% burden). No NSVT, VT or significant pauses.  TTE 06/11/19: IMPRESSIONS   1. The left ventricle has hyperdynamic systolic function, with an  ejection fraction of >65%. The cavity size was normal. There is mild  eccentric  left ventricular hypertrophy. Left ventricular diastolic Doppler  parameters are indeterminate. The E/e' is  9. No evidence of left ventricular regional wall motion abnormalities.   2. The right ventricle has normal systolic function. The cavity was  normal. There is no increase in right ventricular wall thickness. Right  ventricular systolic pressure is normal.   3. There is systolic bowing of the mitral leaflets, without true  prolapse.   4. The aortic valve is tricuspid. Mild thickening of the aortic valve.  Sclerosis without any evidence of stenosis of the aortic valve. Mild  stenosis of the aortic valve.   5. Focal calcification of noncoronary cusp.   6. The aorta is normal unless otherwise noted.   7. The aortic root, ascending aorta and aortic arch are normal in size  and structure.   EKG:  EKG was not ordered today 10/31/20: Afib with HR 116, mild STD in V4-V6, II and aVF  Recent Labs: 10/18/2021: BUN 20; Creatinine, Ser 0.68; Potassium 4.7; Sodium 143 12/11/2021: Hemoglobin 14.0; Platelets 320 08/05/2022: ALT 44  Recent Lipid Panel    Component Value Date/Time   CHOL 195 08/05/2022 0825   TRIG 60 08/05/2022 0825   HDL 104 08/05/2022 0825   CHOLHDL 1.9 08/05/2022 0825   CHOLHDL 2 08/07/2021 1124   VLDL 12.8 08/07/2021 1124   LDLCALC 80 08/05/2022 0825   LDLCALC 161 (H) 06/22/2020 0739   LDLDIRECT 149.6 02/02/2013 1003     Risk Assessment/Calculations:    CHA2DS2-VASc Score =    {This indicates a  % annual risk of stroke. The patient's score is based upon:      Physical Exam:    VS:  There were no vitals taken for this visit.    Wt Readings from Last 3 Encounters:  12/17/21 131 lb (59.4 kg)  10/31/21 127 lb (57.6 kg)  08/07/21 127 lb 3.2 oz (57.7 kg)     GEN:  Well nourished, well developed in no acute distress HEENT: Normal NECK: No JVD; No carotid bruits CARDIAC: RRR, 1/6 murmur, no rubs or gallops RESPIRATORY:  Clear to auscultation without rales,  wheezing or rhonchi  ABDOMEN: Soft, non-tender, non-distended MUSCULOSKELETAL:  No edema; No deformity  SKIN: Warm and dry NEUROLOGIC:  Alert and oriented x 3 PSYCHIATRIC:  Normal affect   ASSESSMENT:    No diagnosis found.   PLAN:    In order of problems listed above:  #Paroxysmal Afib: #Acquired thrombophilia: Zio patch placed for palpitations in 2021 revealed runs of Afib. Started on [facility] with apixaban and metop for rate control. Currently, symptoms are well controlled with minimal palpitations. Tolerating apixaban without issues.  -Continue apixaban 2m BID -Continue metoprolol 12.574mBID (only take if SBP>100) -If maintaining HR >120 for >2 hours, can take an additional 12.4m36mf metop  #Orthostasis: #Lightheadedness: Resolved with stopping the lisinopril.  -TTE 06/2019 with normal BiV function, mild LVH, mild aortic stenosis -Stopped lisinopril -Counseled about adequate fluid and salt intake -Compression therapy    #Hyperlipidemia: Ca score 0. LFTs rose with crestor 77m23mth no significant change in LDL now back on 10mg17mly and zetia.   -Continue Crestor 10mg 72maily -Continue zetia 10mg d44m -LDL now 80    #HTN: Well controlled. -Continue metop for rate control as above -Off lisinopril due to orthostasis symptoms  #Mild AS: Mean gradient 9mmHg. 46m 1.5cm2.  -Repeat TTE 06/2023 for monitoring   Medication Adjustments/Labs and Tests Ordered: Current medicines are reviewed at length with the patient today.  Concerns regarding medicines are outlined above.  No orders of the defined types were placed in this encounter.  No orders of the defined types were placed in this encounter.   There are no Patient Instructions on file for this visit.  I,Mykaella Javier,acting as a scribe for Hancel Ion Freada Bergeronve documented all relevant documentation on the behalf of Janssen Zee Freada Bergerondirected by  Ellia Knowlton Freada Bergeronle in the presence of  Laniya Friedl Freada Bergeron, Fuad Forget Freada Bergeronve reviewed all documentation for this visit. The documentation on  08/12/22 for the exam, diagnosis, procedures, and orders are all accurate and complete.    Signed, Freada Bergeron, MD  08/12/2022 2:24 PM    [place]

## 2022-08-15 ENCOUNTER — Ambulatory Visit: Payer: PPO | Attending: Cardiology | Admitting: Cardiology

## 2022-08-15 ENCOUNTER — Encounter: Payer: Self-pay | Admitting: Cardiology

## 2022-08-15 VITALS — BP 98/64 | HR 64 | Ht 65.0 in | Wt 126.0 lb

## 2022-08-15 DIAGNOSIS — I480 Paroxysmal atrial fibrillation: Secondary | ICD-10-CM | POA: Diagnosis not present

## 2022-08-15 DIAGNOSIS — R42 Dizziness and giddiness: Secondary | ICD-10-CM | POA: Diagnosis not present

## 2022-08-15 DIAGNOSIS — I10 Essential (primary) hypertension: Secondary | ICD-10-CM

## 2022-08-15 DIAGNOSIS — I350 Nonrheumatic aortic (valve) stenosis: Secondary | ICD-10-CM

## 2022-08-15 DIAGNOSIS — E785 Hyperlipidemia, unspecified: Secondary | ICD-10-CM

## 2022-08-15 DIAGNOSIS — Z7901 Long term (current) use of anticoagulants: Secondary | ICD-10-CM | POA: Diagnosis not present

## 2022-08-15 NOTE — Patient Instructions (Addendum)
Medication Instructions:   Your physician recommends that you continue on your current medications as directed. Please refer to the Current Medication list given to you today.  *If you need a refill on your cardiac medications before your next appointment, please call your pharmacy*   Lab Work:  A FEW DAYS PRIOR TO YOUR 6 MONTH APPOINTMENT WITH DR. Beatrix Fetters LIPIDS, CMET, CBC W DIFF, AND HEMOGLOBIN A1C AT THAT TIME--PLEASE COME FASTING  If you have labs (blood work) drawn today and your tests are completely normal, you will receive your results only by: MyChart Message (if you have MyChart) OR A paper copy in the mail If you have any lab test that is abnormal or we need to change your treatment, we will call you to review the results.   Testing/Procedures:  Your physician has requested that you have an echocardiogram. Echocardiography is a painless test that uses sound waves to create images of your heart. It provides your doctor with information about the size and shape of your heart and how well your heart's chambers and valves are working. This procedure takes approximately one hour. There are no restrictions for this procedure. Please do NOT wear cologne, perfume, aftershave, or lotions (deodorant is allowed). Please arrive 15 minutes prior to your appointment time.    Follow-Up:  6 MONTHS IN THE OFFICE WITH DR. Isaiah Blakes HAVE YOUR LABS DONE A FEW DAYS PRIOR TO THIS OFFICE VISIT    Important Information About Sugar

## 2022-08-15 NOTE — Progress Notes (Signed)
Cardiology Office Note:    Date:  08/15/2022   ID:  Crystal Odom, DOB Feb 26, 1941, MRN [id number]  PCP:  Burnis Medin, MD  [facility] Cardiologist:  Freada Bergeron, MD  [facility] Electrophysiologist:  None   Referring MD: Burnis Medin, MD    History of Present Illness:    Crystal Odom is a 81 y.o. female with a hx of HTN, mild AS, HLD and pAfib who returns to clinic for follow-up  Per review of the record patient has Calcium score of 0 in 2017.  Most recent echocardiogram 06/11/2019 with normal LVEF greater than 65%, indeterminate diastolic parameters, no R WMA, RV normal size and function, subtle MVP with trivial MR, mild aortic sclerosis.  Evaluated October 2021 due to palpitations.  Cardiac monitor was placed which showed episodes of atrial fibrillation.  She was started on Eliquis at that time.   Was last seen in clinic on 11/2021 where she was doing very well. Rare palpitations. Started zetia for HLD.   Today, she reports that she is doing well. She is not having any issues with her medications or new symptoms. Her blood pressure was low in the office. At home it is usually 120 and below and occasionally will rise 127-517 systolic. She is not feeling any lightheadedness. She has had a couple of episodes when she is going to bed at night where she is not feeling any discomfort but she will hear the heart beating in a pounding way. There is no shortness of breath or pain associated with these episodes.   She denies any chest pain, shortness of breath, or peripheral edema. No lightheadedness, headaches, syncope, orthopnea, or PND. Tolerating apixaban without issues.    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 neutropenia   Arthritis    left knee   Diplopia 2002   eval neg   Hyperlipidemia    HDL over 100   Hypothyroidism    Kidney stones    in past   Leukopenia    with neg  hemevaluation bm bx get wbc diff q 6 months   UTI (lower urinary tract infection)     Past Surgical History:  Procedure Laterality Date   BONE MARROW BIOPSY     low WBC   CATARACT EXTRACTION, BILATERAL     kidney stone removal     left arthroscopic  2005   left knee   LIVER BIOPSY     reported normal   TONSILLECTOMY     TUBAL LIGATION      Current Medications: Current Meds  Medication Sig   apixaban (ELIQUIS) 5 MG TABS tablet Take 1 tablet (5 mg total) by mouth 2 (two) times daily.   Calcium Carbonate-Vitamin D (CALCIUM-VITAMIN D3 PO) Take 500 mg by mouth in the morning and at bedtime.   CRESTOR 10 MG tablet Take 10 mg by mouth daily.   ezetimibe (ZETIA) 10 MG tablet Take 1 tablet (10 mg total) by mouth daily.   levothyroxine (SYNTHROID) 88 MCG tablet TAKE 1 TABLET BY MOUTH EVERY DAY   metoprolol succinate (TOPROL XL) 25 MG 24 hr tablet Take 0.5 tablets (12.5 mg total) by mouth 2 (two) times daily.     Allergies:   Penicillins   Social History   Socioeconomic History   Marital status: Married    Spouse name: Not on file   Number of children: Not on file  Years of education: Not on file   Highest education level: Bachelor's degree (e.g., BA, AB, BS)  Occupational History   Not on file  Tobacco Use   Smoking status: Never   Smokeless tobacco: Never  Vaping Use   Vaping Use: Never used  Substance and Sexual Activity   Alcohol use: Yes    Alcohol/week: 2.0 - 3.0 standard drinks of alcohol    Types: 2 - 3 Glasses of wine per week    Comment: 2 to 3 glasses a week    Drug use: No   Sexual activity: Not on file  Other Topics Concern   Not on file  Social History Narrative   Married   Retired  2008   Regular exercise-yes   HH of 2   No pets   Social etoh   Crystal Odom 2012   Travel grandchildren    Social Determinants of Health   Financial Resource Strain: Low Risk  (10/31/2021)   Overall Financial Resource Strain (CARDIA)    Difficulty of  Paying Living Expenses: Not hard at all  Food Insecurity: No Food Insecurity (10/31/2021)   Hunger Vital Sign    Worried About Running Out of Food in the Last Year: Never true    [place] in the Last Year: Never true  Transportation Needs: No Transportation Needs (10/31/2021)   PRAPARE - Hydrologist (Medical): No    Lack of Transportation (Non-Medical): No  Physical Activity: Sufficiently Active (10/31/2021)   Exercise Vital Sign    Days of Exercise per Week: 7 days    Minutes of Exercise per Session: 30 min  Stress: No Stress Concern Present (10/31/2021)   [place]    Feeling of Stress : Not at all  Social Connections: Socially Integrated (10/31/2021)   Social Connection and Isolation Panel [NHANES]    Frequency of Communication with Friends and Family: Three times a week    Frequency of Social Gatherings with Friends and Family: Once a week    Attends Religious Services: More than 4 times per year    Active Member of [employer] or Organizations: Yes    Attends Music therapist: More than 4 times per year    Marital Status: Married     Family History: The patient's family history includes Autoimmune disease in her brother and sister; Heart disease in her father and mother; Osteoarthritis in her father; Osteoporosis in an other family member; Other in her father; Stroke in her maternal grandmother and another family member; Thyroid disease in an other family member.  ROS:   Please see the history of present illness.    Review of Systems  Constitutional:  Negative for chills and fever.  HENT:  Negative for nosebleeds.   Eyes:  Negative for blurred vision.  Respiratory:  Negative for shortness of breath.   Cardiovascular:  Positive for palpitations (at night). Negative for chest pain, orthopnea, claudication, leg swelling and PND.  Gastrointestinal:  Negative for blood in stool and  melena.  Genitourinary:  Negative for dysuria and hematuria.  Skin:  Negative for itching and rash.  Neurological:  Negative for dizziness and loss of consciousness.  Endo/Heme/Allergies:  Negative for polydipsia.  Psychiatric/Behavioral:  Negative for substance abuse. The patient is not nervous/anxious.     EKGs/Labs/Other Studies Reviewed:    The following studies were reviewed today: 08/01/20 Cardiac Monitor: Predominant  rhythm is sinus with average HR 62bpm. HR range 38bpm to 220bpm. There were 59 episodes of SVT with fastest 179bpm lasting 7 beats. Longest episode lasted 15.6 secs with average HR 101bpm. There were several runs of Afib (<1% burden) with longest episode lasting 66mn and 10sec with average HR 178. Patient symptomatic at that time. Isolated PACs and PVCs were rare (<1% burden). No NSVT, VT or significant pauses.  TTE 06/11/19: IMPRESSIONS   1. The left ventricle has hyperdynamic systolic function, with an  ejection fraction of >65%. The cavity size was normal. There is mild  eccentric left ventricular hypertrophy. Left ventricular diastolic Doppler  parameters are indeterminate. The E/e' is  9. No evidence of left ventricular regional wall motion abnormalities.   2. The right ventricle has normal systolic function. The cavity was  normal. There is no increase in right ventricular wall thickness. Right  ventricular systolic pressure is normal.   3. There is systolic bowing of the mitral leaflets, without true  prolapse.   4. The aortic valve is tricuspid. Mild thickening of the aortic valve.  Sclerosis without any evidence of stenosis of the aortic valve. Mild  stenosis of the aortic valve.   5. Focal calcification of noncoronary cusp.   6. The aorta is normal unless otherwise noted.   7. The aortic root, ascending aorta and aortic arch are normal in size  and structure.   EKG:  EKG is personally reviewed.  08/15/2022: Normal sinus rhythm. First degree AV  block. HR 64 bpm. 12/17/2021: EKG was not ordered today 10/31/20: Afib with HR 116, mild STD in V4-V6, II and aVF  Recent Labs: 10/18/2021: BUN 20; Creatinine, Ser 0.68; Potassium 4.7; Sodium 143 12/11/2021: Hemoglobin 14.0; Platelets 320 08/05/2022: ALT 44  Recent Lipid Panel    Component Value Date/Time   CHOL 195 08/05/2022 0825   TRIG 60 08/05/2022 0825   HDL 104 08/05/2022 0825   CHOLHDL 1.9 08/05/2022 0825   CHOLHDL 2 08/07/2021 1124   VLDL 12.8 08/07/2021 1124   LDLCALC 80 08/05/2022 0825   LDLCALC 161 (H) 06/22/2020 0739   LDLDIRECT 149.6 02/02/2013 1003     Risk Assessment/Calculations:    CHA2DS2-VASc Score = 4  {This indicates a 4.8% annual risk of stroke. The patient's score is based upon: CHF History: 0 HTN History: 1 Diabetes History: 0 Stroke History: 0 Vascular Disease History: 0 Age Score: 2 Gender Score: 1     Physical Exam:    VS:  BP 98/64   Pulse 64   Ht _0  (1.651 m)   Wt 126 lb (57.2 kg)   SpO2 96%   BMI 20.97 kg/m     Wt Readings from Last 3 Encounters:  08/15/22 126 lb (57.2 kg)  12/17/21 131 lb (59.4 kg)  10/31/21 127 lb (57.6 kg)     GEN:  Well nourished, well developed in no acute distress HEENT: Normal NECK: No JVD; No carotid bruits CARDIAC: RRR, 1/6 murmur. No rubs or gallops RESPIRATORY:  Clear to auscultation without rales, wheezing or rhonchi  ABDOMEN: Soft, non-tender, non-distended MUSCULOSKELETAL:  No edema; No deformity  SKIN: Warm and dry NEUROLOGIC:  Alert and oriented x 3 PSYCHIATRIC:  Normal affect   ASSESSMENT:    1. Paroxysmal atrial fibrillation (HCC)   2. Primary hypertension   3. Chronic anticoagulation   4. Mild aortic stenosis   5. Lightheadedness   6. Hyperlipidemia with target LDL less than 100      PLAN:  In order of problems listed above:  #Paroxysmal Afib: #Acquired thrombophilia: CHADs-vasc 4. Zio patch placed for palpitations in 2021 revealed runs of Afib. Started on [facility] with  apixaban and metop for rate control. Currently, symptoms are well controlled with minimal palpitations. Tolerating apixaban without issues.  -Continue apixaban 13m BID -Continue metoprolol 12.54mBID (only take if SBP>100) -If maintaining HR >120 for >2 hours, can take an additional 12.26m18mf metop  #Orthostasis: #Lightheadedness: Resolved with stopping the lisinopril.  -TTE 06/2019 with normal BiV function, mild LVH, mild aortic stenosis -Stopped lisinopril -Counseled about adequate fluid and salt intake -Compression therapy    #Hyperlipidemia: Ca score 0. LFTs rose with crestor 17m34mth no significant change in LDL now back on 10mg71mly and zetia.   -Continue Crestor 10mg 63maily -Continue zetia 10mg d79m -LDL now 80  -Repeat lipids in 6months52month: Well controlled. -Continue metop for rate control as above -Off lisinopril due to orthostasis symptoms  #Mild AS: Mean gradient 9mmHg. A226m1.5cm2.  -Repeat TTE for monitoring  Follow Up: 6 months Medication Adjustments/Labs and Tests Ordered: Current medicines are reviewed at length with the patient today.  Concerns regarding medicines are outlined above.  Orders Placed This Encounter  Procedures   Lipid Profile   Comp Met (CMET)   HgB A1c   CBC w/Diff   EKG 12-Lead   ECHOCARDIOGRAM COMPLETE   No orders of the defined types were placed in this encounter.   Patient Instructions  Medication Instructions:   Your physician recommends that you continue on your current medications as directed. Please refer to the Current Medication list given to you today.  *If you need a refill on your cardiac medications before your next appointment, please call your pharmacy*   Lab Work:  A FEW DAYS PRIOR TO YOUR 6 MONTH APPOINTMENT WITH DR. PEMBERTONBeatrix FettersCMET, CBC W DIFF, AND HEMOGLOBIN A1C AT THAT TIME--PLEASE COME FASTING  If you have labs (blood work) drawn today and your tests are completely normal, you will  receive your results only by: MyChart Message (if you have MyChart) OR A paper copy in the mail If you have any lab test that is abnormal or we need to change your treatment, we will call you to review the results.   Testing/Procedures:  Your physician has requested that you have an echocardiogram. Echocardiography is a painless test that uses sound waves to create images of your heart. It provides your doctor with information about the size and shape of your heart and how well your heart's chambers and valves are working. This procedure takes approximately one hour. There are no restrictions for this procedure. Please do NOT wear cologne, perfume, aftershave, or lotions (deodorant is allowed). Please arrive 15 minutes prior to your appointment time.    Follow-Up:  6 MONTHS IN THE OFFICE WITH DR. PEMBERTONIsaiah BlakesR LABS DONE A FEW DAYS PRIOR TO THIS OFFICE Odom    Important Information About Sugar         I,Crystal Odom,acting as a scribe for Crystal Odom EFreada Bergerone documented all relevant documentation on the behalf of Nathalia Wismer EFreada Bergeronirected by  Challis Crill EFreada Bergerone in the presence of Reham Slabaugh EFreada Bergeron, Elanore Talcott EFreada Bergerone reviewed all documentation for this Odom. The documentation on 08/15/22 for the exam, diagnosis, procedures, and orders are all accurate and complete.   Signed, Vashawn Ekstein EFreada Bergeron16/2023 2:11 PM  [facility]

## 2022-08-20 ENCOUNTER — Telehealth: Payer: PPO

## 2022-08-29 ENCOUNTER — Encounter: Payer: Self-pay | Admitting: Internal Medicine

## 2022-09-06 ENCOUNTER — Ambulatory Visit (INDEPENDENT_AMBULATORY_CARE_PROVIDER_SITE_OTHER): Payer: PPO

## 2022-09-06 DIAGNOSIS — I350 Nonrheumatic aortic (valve) stenosis: Secondary | ICD-10-CM

## 2022-09-06 LAB — ECHOCARDIOGRAM COMPLETE
AR max vel: 1.95 cm2
AV Area VTI: 1.93 cm2
AV Area mean vel: 1.79 cm2
AV Mean grad: 9 mmHg
AV Peak grad: 16.5 mmHg
Ao pk vel: 2.03 m/s
Area-P 1/2: 4.1 cm2
MV M vel: 2.91 m/s
MV Peak grad: 33.9 mmHg
S' Lateral: 2.06 cm

## 2022-09-09 ENCOUNTER — Telehealth: Payer: Self-pay | Admitting: *Deleted

## 2022-09-09 DIAGNOSIS — I350 Nonrheumatic aortic (valve) stenosis: Secondary | ICD-10-CM

## 2022-09-09 DIAGNOSIS — I77819 Aortic ectasia, unspecified site: Secondary | ICD-10-CM

## 2022-09-09 NOTE — Telephone Encounter (Signed)
The patient has been notified of the result and verbalized understanding.  All questions (if any) were answered.  Pt aware she will need another echo in one year for surveillance of her aorta and aortic valve.  Pt aware I will go ahead and place the one year echo order in the system and send a message to our [facility] Scheduling team to call her back and arrange this appt closer to that time frame.   Pt verbalized understanding and agrees with this plan.

## 2022-09-09 NOTE — Telephone Encounter (Signed)
-----   Message from Freada Bergeron, MD sent at 09/07/2022  7:38 PM EST ----- Her echo shows normal pumping function. She has mild thickening of her aortic valve but there is no significant narrowing which is great. Her aorta is mildly dilated which we will just watch with annual echoes going forward.

## 2022-09-17 ENCOUNTER — Other Ambulatory Visit: Payer: Self-pay

## 2022-09-17 MED ORDER — METOPROLOL SUCCINATE ER 25 MG PO TB24: 12.5000 mg | ORAL_TABLET | Freq: Two times a day (BID) | ORAL | 3 refills | Status: DC

## 2022-10-08 ENCOUNTER — Other Ambulatory Visit (HOSPITAL_BASED_OUTPATIENT_CLINIC_OR_DEPARTMENT_OTHER): Payer: Self-pay | Admitting: Family

## 2022-10-08 DIAGNOSIS — E7800 Pure hypercholesterolemia, unspecified: Secondary | ICD-10-CM

## 2022-10-08 MED ORDER — CRESTOR 10 MG PO TABS: 10.0000 mg | ORAL_TABLET | Freq: Every day | ORAL | 3 refills | Status: DC

## 2022-10-08 NOTE — Telephone Encounter (Signed)
Patient of Dr. Pemberton. Please review for refill. Thank you!  

## 2022-10-10 ENCOUNTER — Encounter: Payer: Self-pay | Admitting: Cardiology

## 2022-10-10 MED ORDER — ROSUVASTATIN CALCIUM 10 MG PO TABS: 10.0000 mg | ORAL_TABLET | Freq: Every day | ORAL | 3 refills | Status: DC

## 2022-10-29 ENCOUNTER — Other Ambulatory Visit: Payer: Self-pay | Admitting: Internal Medicine

## 2022-11-01 ENCOUNTER — Ambulatory Visit (INDEPENDENT_AMBULATORY_CARE_PROVIDER_SITE_OTHER): Payer: PPO

## 2022-11-01 VITALS — Ht 65.0 in | Wt 126.0 lb

## 2022-11-01 DIAGNOSIS — Z0000 Encounter for general adult medical examination without abnormal findings: Secondary | ICD-10-CM

## 2022-11-01 NOTE — Progress Notes (Signed)
Subjective:   Crystal Odom is a 82 y.o. female who presents for Medicare Annual (Subsequent) preventive examination.  Review of Systems    Virtual Visit via Telephone Note  I connected with  Crystal Odom on 11/01/22 at  9:45 AM EST by telephone and verified that I am speaking with the correct person using two identifiers.  Location: Patient: Home Provider: Office Persons participating in the virtual visit: patient/Nurse Health Advisor   I discussed the limitations, risks, security and privacy concerns of performing an evaluation and management service by telephone and the availability of in person appointments. The patient expressed understanding and agreed to proceed.  Interactive audio and video telecommunications were attempted between this nurse and patient, however failed, due to patient having technical difficulties OR patient did not have access to video capability.  We continued and completed visit with audio only.  Some vital signs may be absent or patient reported.   Crystal Peaches, LPN  Cardiac Risk Factors include: advanced age (>10mn, >>60women)     Objective:    Today's Vitals   11/01/22 0950  Weight: 126 lb (57.2 kg)  Height: '5\' 5"'$  (1.651 m)   Body mass index is 20.97 kg/m.     11/01/2022    9:57 AM 10/31/2021   11:45 AM 11/07/2020    9:11 AM 04/22/2018    8:35 AM 07/05/2016    8:43 AM  Advanced Directives  Does Patient Have a Medical Advance Directive? Yes Yes Yes Yes Yes  Type of AParamedicof AJeffersonLiving will HLaPorteLiving will HFlorisLiving will    Does patient want to make changes to medical advance directive?  No - Patient declined No - Patient declined    Copy of HC[place]brain Chart? No - copy requested No - copy requested No - copy requested      Current Medications (verified) Outpatient Encounter Medications as of 11/01/2022  Medication Sig    apixaban (ELIQUIS) 5 MG TABS tablet Take 1 tablet (5 mg total) by mouth 2 (two) times daily.   ezetimibe (ZETIA) 10 MG tablet Take 1 tablet (10 mg total) by mouth daily.   levothyroxine (SYNTHROID) 88 MCG tablet TAKE 1 TABLET BY MOUTH EVERY DAY   metoprolol succinate (TOPROL XL) 25 MG 24 hr tablet Take 0.5 tablets (12.5 mg total) by mouth 2 (two) times daily.   rosuvastatin (CRESTOR) 10 MG tablet Take 1 tablet (10 mg total) by mouth daily.   [DISCONTINUED] Calcium Carbonate-Vitamin D (CALCIUM-VITAMIN D3 PO) Take 500 mg by mouth in the morning and at bedtime.   No facility-administered encounter medications on file as of 11/01/2022.    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   Arthritis    left knee   Diplopia 2002   eval neg   Hyperlipidemia    HDL over 100   Hypothyroidism    Kidney stones    in past   Leukopenia    with neg hemevaluation bm bx get wbc diff q 6 months   UTI (lower urinary tract infection)    Past Surgical History:  Procedure Laterality Date   BONE MARROW BIOPSY     low WBC   CATARACT EXTRACTION, BILATERAL     kidney stone removal     left arthroscopic  2005   left knee  LIVER BIOPSY     reported normal   TONSILLECTOMY     TUBAL LIGATION     Family History  Problem Relation Age of Onset   Other Father        major renal failure; SCCA in ear   Osteoarthritis Father    Heart disease Father    Heart disease Mother        55   Autoimmune disease Sister    Autoimmune disease Brother    Stroke Maternal Grandmother    Thyroid disease Other    Stroke Other    Osteoporosis Other    Social History   Socioeconomic History   Marital status: Married    Spouse name: Not on file   Number of children: Not on file   Years of education: Not on file   Highest education level: Bachelor's degree (e.g., BA, AB, BS)  Occupational  History   Not on file  Tobacco Use   Smoking status: Never   Smokeless tobacco: Never  Vaping Use   Vaping Use: Never used  Substance and Sexual Activity   Alcohol use: Yes    Alcohol/week: 2.0 - 3.0 standard drinks of alcohol    Types: 2 - 3 Glasses of wine per week    Comment: 2 to 3 glasses a week    Drug use: No   Sexual activity: Not on file  Other Topics Concern   Not on file  Social History Narrative   Married   Retired  2008   Regular exercise-yes   HH of 2   No pets   Social etoh   [place] CABG 2012   Travel grandchildren    Social Determinants of Health   Financial Resource Strain: Low Risk  (11/01/2022)   Overall Financial Resource Strain (CARDIA)    Difficulty of Paying Living Expenses: Not hard at all  Food Insecurity: No Food Insecurity (11/01/2022)   Hunger Vital Sign    Worried About Running Out of Food in the Last Year: Never true    Ran Out of Food in the Last Year: Never true  Transportation Needs: No Transportation Needs (11/01/2022)   PRAPARE - Hydrologist (Medical): No    Lack of Transportation (Non-Medical): No  Physical Activity: Sufficiently Active (11/01/2022)   Exercise Vital Sign    Days of Exercise per Week: 7 days    Minutes of Exercise per Session: 30 min  Stress: No Stress Concern Present (11/01/2022)   [place]    Feeling of Stress : Not at all  Social Connections: [place] (11/01/2022)   Social Connection and Isolation Panel [NHANES]    Frequency of Communication with Friends and Family: More than three times a week    Frequency of Social Gatherings with Friends and Family: More than three times a week    Attends Religious Services: More than 4 times per year    Active Member of [employer] or Organizations: Yes    Attends Music therapist: More than 4 times per year    Marital Status: Married    Tobacco  Counseling Counseling given: Not Answered   Clinical Intake:  Pre-visit preparation completed: No  Pain : No/denies pain     BMI - recorded: 20.87 Nutritional Status: BMI of 19-24  Normal Nutritional Risks: None Diabetes: No  How often do you need to have someone help you when  you read instructions, pamphlets, or other written materials from your doctor or pharmacy?: 1 - Never  Diabetic?  No  Interpreter Needed?: No  Information entered by :: Rolene Arbour LPN   Activities of Daily Living    11/01/2022    9:55 AM 10/28/2022    9:01 PM  In your present state of health, do you have any difficulty performing the following activities:  Hearing? 1 0  Comment Wears hearing aids   Vision? 0 0  Difficulty concentrating or making decisions? 0 0  Walking or climbing stairs? 0 0  Dressing or bathing? 0 0  Doing errands, shopping? 0 0  Preparing Food and eating ? N N  Using the Toilet? N N  In the past six months, have you accidently leaked urine? N N  Do you have problems with loss of bowel control? N N  Managing your Medications? N N  Managing your Finances? N N  Housekeeping or managing your Housekeeping? N N    Patient Care Team: Panosh, Standley Brooking, MD as PCP - General Johney Frame Greer Ee, MD as PCP - Cardiology (Cardiology) Elsie Saas, MD (Orthopedic Surgery) Unice Bailey, MD (Internal Medicine) Pyrtle, Lajuan Lines, MD as Consulting Physician (Gastroenterology) Pyrtle, Lajuan Lines, MD as Consulting Physician (Gastroenterology) Marica Otter, OD (Optometry) Freada Bergeron, MD as Consulting Physician (Cardiology) Viona Gilmore, [facility] (Inactive) as Pharmacist (Pharmacist)  Indicate any recent Medical Services you may have received from other than [facility] providers in the past year (date may be approximate).     Assessment:   This is a routine wellness examination for [place].  Hearing/Vision screen Hearing Screening - Comments:: Wears hearing aids Vision  Screening - Comments:: Wears rx glasses - up to date with routine eye exams with  Dr Sabra Heck  Dietary issues and exercise activities discussed: Exercise limited by: None identified   Goals Addressed               This Visit's Progress     Patient stated (pt-stated)        Continue to eat healthy and exercise.       Depression Screen    11/01/2022    9:54 AM 10/31/2021   11:37 AM 11/07/2020    9:14 AM 07/25/2020   10:43 AM 06/04/2019    8:34 AM 06/02/2018    8:45 AM 04/22/2018    8:40 AM  PHQ 2/9 Scores  PHQ - 2 Score 0 0 0 0 0 0 0    Fall Risk    11/01/2022    9:55 AM 10/28/2022    9:01 PM 10/31/2021   11:42 AM 10/29/2021   11:19 AM 11/07/2020    9:13 AM  Fall Risk   Falls in the past year? 0 0 0 0 0  Number falls in past yr: 0 0 0 0 0  Injury with Fall? 0 0 0 0 0  Risk for fall due to : No Fall Risks  No Fall Risks  No Fall Risks  Follow up Falls prevention discussed    Falls evaluation completed;Falls prevention discussed    FALL RISK PREVENTION PERTAINING TO THE HOME:  Any stairs in or around the home? Yes  If so, are there any without handrails? No  Home free of loose throw rugs in walkways, pet beds, electrical cords, etc? Yes  Adequate lighting in your home to reduce risk of falls? Yes   ASSISTIVE DEVICES UTILIZED TO PREVENT FALLS:  Life alert? No  Use of a  cane, walker or w/c? No  Grab bars in the bathroom? No  Shower chair or bench in shower? No  Elevated toilet seat or a handicapped toilet? Yes  TIMED UP AND GO:  Was the test performed? No . Audio Visit   Cognitive Function:        11/01/2022    9:57 AM 10/31/2021   11:43 AM  6CIT Screen  What Year? 0 points 0 points  What month? 0 points 0 points  What time? 0 points 0 points  Count back from 20 0 points 0 points  Months in reverse 0 points 0 points  Repeat phrase 0 points 0 points  Total Score 0 points 0 points    Immunizations Immunization History  Administered Date(s) Administered    Covid-19, Mrna,Vaccine(Spikevax)97yr and older 08/24/2022   Fluad Quad(high Dose 65+) 06/04/2019, 05/26/2020, 06/29/2021   Hep A / Hep B 12/28/2009, 02/01/2010   Influenza Split 06/17/2013   Influenza Whole 08/10/2007, 07/07/2008   Influenza, High Dose Seasonal PF 07/13/2014, 06/28/2015, 07/05/2016, 05/28/2017, 06/02/2018, 07/03/2022   Influenza-Unspecified 06/28/2015   PFIZER Comirnaty(Gray Top)Covid-19 Tri-Sucrose Vaccine 01/09/2021   PFIZER(Purple Top)SARS-COV-2 Vaccination 10/10/2019, 10/31/2019, 07/03/2020   Pfizer Covid-19 Vaccine Bivalent Booster 188yr& up 06/14/2021, 01/30/2022   Pneumococcal Conjugate-13 05/02/2014   Pneumococcal Polysaccharide-23 07/19/2008   Rsv, Bivalent, Protein Subunit Rsvpref,pf (AEvans Lance10/12/2021   Td 10/31/2002   Tdap 02/02/2013   Zoster Recombinat (Shingrix) 10/31/2016, 01/28/2018   Zoster, Live 08/10/2007    TDAP status: Up to date  Flu Vaccine status: Up to date  Pneumococcal vaccine status: Up to date  Covid-19 vaccine status: Completed vaccines  Qualifies for Shingles Vaccine? Yes   Zostavax completed Yes   Shingrix Completed?: Yes  Screening Tests Health Maintenance  Topic Date Due   COVID-19 Vaccine (8 - 2023-24 season) 11/17/2022 (Originally 10/19/2022)   DTaP/Tdap/Td (3 - Td or Tdap) 02/03/2023   Medicare Annual Wellness (AWV)  11/02/2023   Pneumonia Vaccine 6530Years old  Completed   INFLUENZA VACCINE  Completed   DEXA SCAN  Completed   Hepatitis C Screening  Completed   Zoster Vaccines- Shingrix  Completed   HPV VACCINES  Aged Out   COLONOSCOPY (Pts 45-4930yrnsurance coverage will need to be confirmed)  Discontinued    Health Maintenance  There are no preventive care reminders to display for this patient.   Colorectal cancer screening: No longer required.   Mammogram status: No longer required due to Age.  Bone Density status: Completed 08/30/21. Results reflect: Bone density results: OSTEOPOROSIS. Repeat every    years.  Lung Cancer Screening: (Low Dose CT Chest recommended if Age 100-51-80ars, 30 pack-year currently smoking OR have quit w/in 15years.) does not qualify.     Additional Screening:  Hepatitis C Screening: does qualify; Completed 12/11/21  Vision Screening: Recommended annual ophthalmology exams for early detection of glaucoma and other disorders of the eye. Is the patient up to date with their annual eye exam?  Yes  Who is the provider or what is the name of the office in which the patient attends annual eye exams? Dr MilSabra Heck pt is not established with a provider, would they like to be referred to a provider to establish care? No .   Dental Screening: Recommended annual dental exams for proper oral hygiene  Community Resource Referral / Chronic Care Management: CRR required this visit?  No   CCM required this visit?  No      Plan:  I have personally reviewed and noted the following in the patient's chart:   Medical and social history Use of alcohol, tobacco or illicit drugs  Current medications and supplements including opioid prescriptions. Patient is not currently taking opioid prescriptions. Functional ability and status Nutritional status Physical activity Advanced directives List of other physicians Hospitalizations, surgeries, and ER visits in previous 12 months Vitals Screenings to include cognitive, depression, and falls Referrals and appointments  In addition, I have reviewed and discussed with patient certain preventive protocols, quality metrics, and best practice recommendations. A written personalized care plan for preventive services as well as general preventive health recommendations were provided to patient.     Crystal Peaches, LPN   01/02/3645   Nurse Notes: None

## 2022-11-01 NOTE — Patient Instructions (Addendum)
Crystal Odom , Thank you for taking time to come for your Medicare Wellness Visit. I appreciate your ongoing commitment to your health goals. Please review the following plan we discussed and let me know if I can assist you in the future.   These are the goals we discussed:  Goals       Patient Stated      Will add on resistance work or strength training  Go slow and low and get help at first       Patient Stated      I will continue to walk 30 minutes daily       Patient stated (pt-stated)      Continue to eat healthy and exercise.      Reduce sodium intake      Will try herbs substitute  Will give further info on dash diet  Trying to be plant based         This is a list of the screening recommended for you and due dates:  Health Maintenance  Topic Date Due   COVID-19 Vaccine (8 - 2023-24 season) 11/17/2022*   DTaP/Tdap/Td vaccine (3 - Td or Tdap) 02/03/2023   Medicare Annual Wellness Visit  11/02/2023   Pneumonia Vaccine  Completed   Flu Shot  Completed   DEXA scan (bone density measurement)  Completed   Hepatitis C Screening: USPSTF Recommendation to screen - Ages 66-79 yo.  Completed   Zoster (Shingles) Vaccine  Completed   HPV Vaccine  Aged Out   Colon Cancer Screening  Discontinued  *Topic was postponed. The date shown is not the original due date.    Advanced directives: Please bring a copy of your health care power of attorney and living will to the office to be added to your chart at your convenience.   Conditions/risks identified: None  Next appointment: Follow up in one year for your annual wellness visit     Preventive Care 65 Years and Older, Female Preventive care refers to lifestyle choices and visits with your health care provider that can promote health and wellness. What does preventive care include? A yearly physical exam. This is also called an annual well check. Dental exams once or twice a year. Routine eye exams. Ask your health care  provider how often you should have your eyes checked. Personal lifestyle choices, including: Daily care of your teeth and gums. Regular physical activity. Eating a healthy diet. Avoiding tobacco and drug use. Limiting alcohol use. Practicing safe sex. Taking low-dose aspirin every day. Taking vitamin and mineral supplements as recommended by your health care provider. What happens during an annual well check? The services and screenings done by your health care provider during your annual well check will depend on your age, overall health, lifestyle risk factors, and family history of disease. Counseling  Your health care provider may ask you questions about your: Alcohol use. Tobacco use. Drug use. Emotional well-being. Home and relationship well-being. Sexual activity. Eating habits. History of falls. Memory and ability to understand (cognition). Work and work Statistician. Reproductive health. Screening  You may have the following tests or measurements: Height, weight, and BMI. Blood pressure. Lipid and cholesterol levels. These may be checked every 5 years, or more frequently if you are over 14 years old. Skin check. Lung cancer screening. You may have this screening every year starting at age 66 if you have a 30-pack-year history of smoking and currently smoke or have quit within the past 15 years. Fecal  occult blood test (FOBT) of the stool. You may have this test every year starting at age 44. Flexible sigmoidoscopy or colonoscopy. You may have a sigmoidoscopy every 5 years or a colonoscopy every 10 years starting at age 78. Hepatitis C blood test. Hepatitis B blood test. Sexually transmitted disease (STD) testing. Diabetes screening. This is done by checking your blood sugar (glucose) after you have not eaten for a while (fasting). You may have this done every 1-3 years. Bone density scan. This is done to screen for osteoporosis. You may have this done starting at age  53. Mammogram. This may be done every 1-2 years. Talk to your health care provider about how often you should have regular mammograms. Talk with your health care provider about your test results, treatment options, and if necessary, the need for more tests. Vaccines  Your health care provider may recommend certain vaccines, such as: Influenza vaccine. This is recommended every year. Tetanus, diphtheria, and acellular pertussis (Tdap, Td) vaccine. You may need a Td booster every 10 years. Zoster vaccine. You may need this after age 68. Pneumococcal 13-valent conjugate (PCV13) vaccine. One dose is recommended after age 87. Pneumococcal polysaccharide (PPSV23) vaccine. One dose is recommended after age 5. Talk to your health care provider about which screenings and vaccines you need and how often you need them. This information is not intended to replace advice given to you by your health care provider. Make sure you discuss any questions you have with your health care provider. Document Released: 10/13/2015 Document Revised: 06/05/2016 Document Reviewed: 07/18/2015 Elsevier Interactive Patient Education  2017 Home Prevention in the Home Falls can cause injuries. They can happen to people of all ages. There are many things you can do to make your home safe and to help prevent falls. What can I do on the outside of my home? Regularly fix the edges of walkways and driveways and fix any cracks. Remove anything that might make you trip as you walk through a door, such as a raised step or threshold. Trim any bushes or trees on the path to your home. Use bright outdoor lighting. Clear any walking paths of anything that might make someone trip, such as rocks or tools. Regularly check to see if handrails are loose or broken. Make sure that both sides of any steps have handrails. Any raised decks and porches should have guardrails on the edges. Have any leaves, snow, or ice cleared  regularly. Use sand or salt on walking paths during winter. Clean up any spills in your garage right away. This includes oil or grease spills. What can I do in the bathroom? Use night lights. Install grab bars by the toilet and in the tub and shower. Do not use towel bars as grab bars. Use non-skid mats or decals in the tub or shower. If you need to sit down in the shower, use a plastic, non-slip stool. Keep the floor dry. Clean up any water that spills on the floor as soon as it happens. Remove soap buildup in the tub or shower regularly. Attach bath mats securely with double-sided non-slip rug tape. Do not have throw rugs and other things on the floor that can make you trip. What can I do in the bedroom? Use night lights. Make sure that you have a light by your bed that is easy to reach. Do not use any sheets or blankets that are too big for your bed. They should not hang down onto the  floor. Have a firm chair that has side arms. You can use this for support while you get dressed. Do not have throw rugs and other things on the floor that can make you trip. What can I do in the kitchen? Clean up any spills right away. Avoid walking on wet floors. Keep items that you use a lot in easy-to-reach places. If you need to reach something above you, use a strong step stool that has a grab bar. Keep electrical cords out of the way. Do not use floor polish or wax that makes floors slippery. If you must use wax, use non-skid floor wax. Do not have throw rugs and other things on the floor that can make you trip. What can I do with my stairs? Do not leave any items on the stairs. Make sure that there are handrails on both sides of the stairs and use them. Fix handrails that are broken or loose. Make sure that handrails are as long as the stairways. Check any carpeting to make sure that it is firmly attached to the stairs. Fix any carpet that is loose or worn. Avoid having throw rugs at the top or  bottom of the stairs. If you do have throw rugs, attach them to the floor with carpet tape. Make sure that you have a light switch at the top of the stairs and the bottom of the stairs. If you do not have them, ask someone to add them for you. What else can I do to help prevent falls? Wear shoes that: Do not have high heels. Have rubber bottoms. Are comfortable and fit you well. Are closed at the toe. Do not wear sandals. If you use a stepladder: Make sure that it is fully opened. Do not climb a closed stepladder. Make sure that both sides of the stepladder are locked into place. Ask someone to hold it for you, if possible. Clearly mark and make sure that you can see: Any grab bars or handrails. First and last steps. Where the edge of each step is. Use tools that help you move around (mobility aids) if they are needed. These include: Canes. Walkers. Scooters. Crutches. Turn on the lights when you go into a dark area. Replace any light bulbs as soon as they burn out. Set up your furniture so you have a clear path. Avoid moving your furniture around. If any of your floors are uneven, fix them. If there are any pets around you, be aware of where they are. Review your medicines with your doctor. Some medicines can make you feel dizzy. This can increase your chance of falling. Ask your doctor what other things that you can do to help prevent falls. This information is not intended to replace advice given to you by your health care provider. Make sure you discuss any questions you have with your health care provider. Document Released: 07/13/2009 Document Revised: 02/22/2016 Document Reviewed: 10/21/2014 Elsevier Interactive Patient Education  2017 [employer].

## 2022-11-04 ENCOUNTER — Ambulatory Visit: Payer: PPO | Admitting: Internal Medicine

## 2022-11-04 NOTE — Progress Notes (Unsigned)
No chief complaint on file.   HPI: Patient  Crystal Odom  82 y.o. comes in today for Preventive Health Care visit    Thyroid CV  afib anticoag  and metoprolol per dr Johney Frame Hld lipids  crestor and zetia  Had awv  last pv was 11 22   Health Maintenance  Topic Date Due   COVID-19 Vaccine (8 - 2023-24 season) 11/17/2022 (Originally 10/19/2022)   DTaP/Tdap/Td (3 - Td or Tdap) 02/03/2023   Medicare Annual Wellness (AWV)  11/02/2023   Pneumonia Vaccine 100+ Years old  Completed   INFLUENZA VACCINE  Completed   DEXA SCAN  Completed   Hepatitis C Screening  Completed   Zoster Vaccines- Shingrix  Completed   HPV VACCINES  Aged Out   COLONOSCOPY (Pts 45-27yr Insurance coverage will need to be confirmed)  Discontinued   Health Maintenance Review LIFESTYLE:  Exercise:   Tobacco/ETS: Alcohol:  Sugar beverages: Sleep: Drug use: no HH of  Work:    ROS:  GEN/ HEENT: No fever, significant weight changes sweats headaches vision problems hearing changes, CV/ PULM; No chest pain shortness of breath cough, syncope,edema  change in exercise tolerance. GI /GU: No adominal pain, vomiting, change in bowel habits. No blood in the stool. No significant GU symptoms. SKIN/HEME: ,no acute skin rashes suspicious lesions or bleeding. No lymphadenopathy, nodules, masses.  NEURO/ PSYCH:  No neurologic signs such as weakness numbness. No depression anxiety. IMM/ Allergy: No unusual infections.  Allergy .   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 neutropenia   Arthritis    left knee   Diplopia 2002   eval neg   Hyperlipidemia    HDL over 100   Hypothyroidism    Kidney stones    in past   Leukopenia    with neg hemevaluation bm bx get wbc diff q 6 months   UTI (lower urinary tract infection)     Past Surgical History:  Procedure Laterality Date    BONE MARROW BIOPSY     low WBC   CATARACT EXTRACTION, BILATERAL     kidney stone removal     left arthroscopic  2005   left knee   LIVER BIOPSY     reported normal   TONSILLECTOMY     TUBAL LIGATION      Family History  Problem Relation Age of Onset   Other Father        major renal failure; SCCA in ear   Osteoarthritis Father    Heart disease Father    Heart disease Mother        768  Autoimmune disease Sister    Autoimmune disease Brother    Stroke Maternal Grandmother    Thyroid disease Other    Stroke Other    Osteoporosis Other     Social History   Socioeconomic History   Marital status: Married    Spouse name: Not on file   Number of children: Not on file   Years of education: Not on file   Highest education level: Bachelor's degree (e.g., BA, AB, BS)  Occupational History   Not on file  Tobacco Use   Smoking status: Never   Smokeless tobacco: Never  Vaping Use   Vaping Use: Never used  Substance and Sexual Activity   Alcohol use: Yes    Alcohol/week:  2.0 - 3.0 standard drinks of alcohol    Types: 2 - 3 Glasses of wine per week    Comment: 2 to 3 glasses a week    Drug use: No   Sexual activity: Not on file  Other Topics Concern   Not on file  Social History Narrative   Married   Retired  2008   Regular exercise-yes   HH of 2   No pets   Social etoh   [place] CABG 2012   Travel grandchildren    Social Determinants of Health   Financial Resource Strain: Low Risk  (11/01/2022)   Overall Financial Resource Strain (CARDIA)    Difficulty of Paying Living Expenses: Not hard at all  Food Insecurity: No Food Insecurity (11/01/2022)   Hunger Vital Sign    Worried About Running Out of Food in the Last Year: Never true    Ran Out of Food in the Last Year: Never true  Transportation Needs: No Transportation Needs (11/01/2022)   PRAPARE - Hydrologist (Medical): No    Lack of Transportation (Non-Medical): No   Physical Activity: Sufficiently Active (11/01/2022)   Exercise Vital Sign    Days of Exercise per Week: 7 days    Minutes of Exercise per Session: 40 min  Stress: No Stress Concern Present (11/01/2022)   [place]    Feeling of Stress : Not at all  Social Connections: [place] (11/01/2022)   Social Connection and Isolation Panel [NHANES]    Frequency of Communication with Friends and Family: More than three times a week    Frequency of Social Gatherings with Friends and Family: More than three times a week    Attends Religious Services: More than 4 times per year    Active Member of [employer] or Organizations: Yes    Attends Music therapist: More than 4 times per year    Marital Status: Married    Outpatient Medications Prior to Visit  Medication Sig Dispense Refill   apixaban (ELIQUIS) 5 MG TABS tablet Take 1 tablet (5 mg total) by mouth 2 (two) times daily. 60 tablet 5   ezetimibe (ZETIA) 10 MG tablet Take 1 tablet (10 mg total) by mouth daily. 90 tablet 3   levothyroxine (SYNTHROID) 88 MCG tablet TAKE 1 TABLET BY MOUTH EVERY DAY 90 tablet 0   metoprolol succinate (TOPROL XL) 25 MG 24 hr tablet Take 0.5 tablets (12.5 mg total) by mouth 2 (two) times daily. 90 tablet 3   rosuvastatin (CRESTOR) 10 MG tablet Take 1 tablet (10 mg total) by mouth daily. 90 tablet 3   No facility-administered medications prior to visit.     EXAM:  There were no vitals taken for this visit.  There is no height or weight on file to calculate BMI. Wt Readings from Last 3 Encounters:  11/01/22 126 lb (57.2 kg)  08/15/22 126 lb (57.2 kg)  12/17/21 131 lb (59.4 kg)    Physical Exam: Vital signs reviewed KDT:OIZT is a well-developed well-nourished alert cooperative    who appearsr stated age in no acute distress.  HEENT: normocephalic atraumatic , Eyes: PERRL EOM's full, conjunctiva clear, Nares: paten,t no deformity  discharge or tenderness., Ears: no deformity EAC's clear TMs with normal landmarks. Mouth: clear OP, no lesions, edema.  Moist mucous membranes. Dentition in adequate repair. NECK: supple without masses, thyromegaly or bruits. CHEST/PULM:  Clear to auscultation and percussion breath sounds equal no wheeze , rales or rhonchi. No chest wall deformities or tenderness. Breast: normal by inspection . No dimpling, discharge, masses, tenderness or discharge . CV: PMI is nondisplaced, S1 S2 no gallops, murmurs, rubs. Peripheral pulses are full without delay.No JVD .  ABDOMEN: Bowel sounds normal nontender  No guard or rebound, no hepato splenomegal no CVA tenderness.  No hernia. Extremtities:  No clubbing cyanosis or edema, no acute joint swelling or redness no focal atrophy NEURO:  Oriented x3, cranial nerves 3-12 appear to be intact, no obvious focal weakness,gait within normal limits no abnormal reflexes or asymmetrical SKIN: No acute rashes normal turgor, color, no bruising or petechiae. PSYCH: Oriented, good eye contact, no obvious depression anxiety, cognition and judgment appear normal. LN: no cervical axillary inguinal adenopathy  Lab Results  Component Value Date   WBC 3.5 12/11/2021   HGB 14.0 12/11/2021   HCT 40.6 12/11/2021   PLT 320 12/11/2021   GLUCOSE 86 10/18/2021   CHOL 195 08/05/2022   TRIG 60 08/05/2022   HDL 104 08/05/2022   LDLDIRECT 149.6 02/02/2013   LDLCALC 80 08/05/2022   ALT 44 (H) 08/05/2022   AST 40 08/05/2022   NA 143 10/18/2021   K 4.7 10/18/2021   CL 102 10/18/2021   CREATININE 0.68 10/18/2021   BUN 20 10/18/2021   CO2 24 10/18/2021   TSH 1.14 08/07/2021   INR 0.91 11/10/2009   HGBA1C 5.6 06/02/2018    BP Readings from Last 3 Encounters:  08/15/22 98/64  12/17/21 120/80  10/10/21 136/72    Lab plan  due    ASSESSMENT AND PLAN:  Discussed the following assessment and plan:    ICD-10-CM   1. Visit for preventive health examination  Z00.00      2. Medication management  Z79.899     3. Hypothyroidism, unspecified type  E03.9     4. Anticoagulant long-term use  Z79.01    afib     No follow-ups on file.  Patient Care Team: Daysy Santini, Standley Brooking, MD as PCP - General Johney Frame Greer Ee, MD as PCP - Cardiology (Cardiology) Elsie Saas, MD (Orthopedic Surgery) Unice Bailey, MD (Internal Medicine) Pyrtle, Lajuan Lines, MD as Consulting Physician (Gastroenterology) Pyrtle, Lajuan Lines, MD as Consulting Physician (Gastroenterology) Marica Otter, OD (Optometry) Freada Bergeron, MD as Consulting Physician (Cardiology) Viona Gilmore, [facility] (Inactive) as Pharmacist (Pharmacist) There are no Patient Instructions on file for this visit.  Standley Brooking. Airrion Otting M.D.

## 2022-11-05 ENCOUNTER — Ambulatory Visit (INDEPENDENT_AMBULATORY_CARE_PROVIDER_SITE_OTHER): Payer: PPO | Admitting: Internal Medicine

## 2022-11-05 ENCOUNTER — Encounter: Payer: Self-pay | Admitting: Internal Medicine

## 2022-11-05 VITALS — BP 144/84 | HR 66 | Temp 97.6°F | Ht 65.0 in | Wt 129.6 lb

## 2022-11-05 DIAGNOSIS — Z7901 Long term (current) use of anticoagulants: Secondary | ICD-10-CM | POA: Diagnosis not present

## 2022-11-05 DIAGNOSIS — Z79899 Other long term (current) drug therapy: Secondary | ICD-10-CM

## 2022-11-05 DIAGNOSIS — D72819 Decreased white blood cell count, unspecified: Secondary | ICD-10-CM

## 2022-11-05 DIAGNOSIS — Z0000 Encounter for general adult medical examination without abnormal findings: Secondary | ICD-10-CM | POA: Diagnosis not present

## 2022-11-05 DIAGNOSIS — E785 Hyperlipidemia, unspecified: Secondary | ICD-10-CM | POA: Diagnosis not present

## 2022-11-05 DIAGNOSIS — I480 Paroxysmal atrial fibrillation: Secondary | ICD-10-CM | POA: Diagnosis not present

## 2022-11-05 DIAGNOSIS — R7989 Other specified abnormal findings of blood chemistry: Secondary | ICD-10-CM

## 2022-11-05 DIAGNOSIS — E039 Hypothyroidism, unspecified: Secondary | ICD-10-CM | POA: Diagnosis not present

## 2022-11-05 NOTE — Patient Instructions (Signed)
Good to see  you today  Get fasting lab appt for full panel lab including  thyroid level.  Will share with Dr Johney Frame. Take blood pressure readings twice a day for3-5  days and then periodically .To ensure below 140/90   .Send in readings  to document.

## 2022-11-08 ENCOUNTER — Other Ambulatory Visit (INDEPENDENT_AMBULATORY_CARE_PROVIDER_SITE_OTHER): Payer: PPO

## 2022-11-08 DIAGNOSIS — Z7901 Long term (current) use of anticoagulants: Secondary | ICD-10-CM | POA: Diagnosis not present

## 2022-11-08 DIAGNOSIS — Z79899 Other long term (current) drug therapy: Secondary | ICD-10-CM | POA: Diagnosis not present

## 2022-11-08 DIAGNOSIS — I480 Paroxysmal atrial fibrillation: Secondary | ICD-10-CM

## 2022-11-08 DIAGNOSIS — E785 Hyperlipidemia, unspecified: Secondary | ICD-10-CM | POA: Diagnosis not present

## 2022-11-08 DIAGNOSIS — E039 Hypothyroidism, unspecified: Secondary | ICD-10-CM | POA: Diagnosis not present

## 2022-11-08 LAB — CBC WITH DIFFERENTIAL/PLATELET
Basophils Absolute: 0 10*3/uL (ref 0.0–0.1)
Basophils Relative: 1.2 % (ref 0.0–3.0)
Eosinophils Absolute: 0.1 10*3/uL (ref 0.0–0.7)
Eosinophils Relative: 2.9 % (ref 0.0–5.0)
HCT: 41.6 % (ref 36.0–46.0)
Hemoglobin: 14.1 g/dL (ref 12.0–15.0)
Lymphocytes Relative: 26.2 % (ref 12.0–46.0)
Lymphs Abs: 0.9 10*3/uL (ref 0.7–4.0)
MCHC: 34 g/dL (ref 30.0–36.0)
MCV: 96.3 fl (ref 78.0–100.0)
Monocytes Absolute: 0.5 10*3/uL (ref 0.1–1.0)
Monocytes Relative: 13.4 % — ABNORMAL HIGH (ref 3.0–12.0)
Neutro Abs: 2 10*3/uL (ref 1.4–7.7)
Neutrophils Relative %: 56.3 % (ref 43.0–77.0)
Platelets: 242 10*3/uL (ref 150.0–400.0)
RBC: 4.32 Mil/uL (ref 3.87–5.11)
RDW: 13.5 % (ref 11.5–15.5)
WBC: 3.6 10*3/uL — ABNORMAL LOW (ref 4.0–10.5)

## 2022-11-08 LAB — LIPID PANEL
Cholesterol: 180 mg/dL (ref 0–200)
HDL: 92.9 mg/dL (ref >39.00)
LDL Cholesterol: 78 mg/dL (ref 0–99)
NonHDL: 86.8
Total CHOL/HDL Ratio: 2
Triglycerides: 46 mg/dL (ref 0.0–149.0)
VLDL: 9.2 mg/dL (ref 0.0–40.0)

## 2022-11-08 LAB — VITAMIN D 25 HYDROXY (VIT D DEFICIENCY, FRACTURES): VITD: 40.51 ng/mL (ref 30.00–100.00)

## 2022-11-08 LAB — HEPATIC FUNCTION PANEL
ALT: 35 U/L (ref 0–35)
AST: 33 U/L (ref 0–37)
Albumin: 4 g/dL (ref 3.5–5.2)
Alkaline Phosphatase: 65 U/L (ref 39–117)
Bilirubin, Direct: 0.1 mg/dL (ref 0.0–0.3)
Total Bilirubin: 0.7 mg/dL (ref 0.2–1.2)
Total Protein: 7 g/dL (ref 6.0–8.3)

## 2022-11-08 LAB — TSH: TSH: 1.43 u[IU]/mL (ref 0.35–5.50)

## 2022-11-08 LAB — BASIC METABOLIC PANEL
BUN: 16 mg/dL (ref 6–23)
CO2: 30 mEq/L (ref 19–32)
Calcium: 9.6 mg/dL (ref 8.4–10.5)
Chloride: 102 mEq/L (ref 96–112)
Creatinine, Ser: 0.65 mg/dL (ref 0.40–1.20)
GFR: 83.74 mL/min (ref >60.00)
Glucose, Bld: 82 mg/dL (ref 70–99)
Potassium: 4.3 mEq/L (ref 3.5–5.1)
Sodium: 141 mEq/L (ref 135–145)

## 2022-11-10 NOTE — Progress Notes (Signed)
Results stable    or  in range at goal

## 2022-11-12 ENCOUNTER — Other Ambulatory Visit: Payer: Self-pay | Admitting: Internal Medicine

## 2022-11-12 DIAGNOSIS — Z1231 Encounter for screening mammogram for malignant neoplasm of breast: Secondary | ICD-10-CM

## 2022-11-16 ENCOUNTER — Encounter: Payer: Self-pay | Admitting: Internal Medicine

## 2022-11-18 NOTE — Telephone Encounter (Signed)
Thanks for the update  confirming  bp levels are acceptable  out of office . No other  advise

## 2022-11-29 ENCOUNTER — Encounter: Payer: Self-pay | Admitting: Cardiology

## 2022-11-29 MED ORDER — EZETIMIBE 10 MG PO TABS: 10.0000 mg | ORAL_TABLET | Freq: Every day | ORAL | 3 refills | Status: DC

## 2022-12-26 ENCOUNTER — Ambulatory Visit
Admission: RE | Admit: 2022-12-26 | Discharge: 2022-12-26 | Disposition: A | Discharge status: Home / Self Care | Payer: PPO | Source: Ambulatory Visit | Attending: Internal Medicine | Admitting: Internal Medicine

## 2022-12-26 DIAGNOSIS — Z1231 Encounter for screening mammogram for malignant neoplasm of breast: Secondary | ICD-10-CM

## 2023-01-22 ENCOUNTER — Other Ambulatory Visit: Payer: Self-pay

## 2023-01-22 DIAGNOSIS — I480 Paroxysmal atrial fibrillation: Secondary | ICD-10-CM

## 2023-01-22 MED ORDER — APIXABAN 5 MG PO TABS
5.0000 mg | ORAL_TABLET | Freq: Two times a day (BID) | ORAL | 5 refills | Status: AC
Start: 2023-01-22 — End: ?

## 2023-01-22 NOTE — Telephone Encounter (Signed)
Received faxed refill request for Eliquis.  Pt last saw Dr Shari Prows 08/15/22, last labs 11/08/22 Creat 0.65, age 82, weight 58.8kg, based on specified criteria pt is on appropriate dosage of Eliquis  BID for afib.  Will refill rx.

## 2023-01-28 ENCOUNTER — Other Ambulatory Visit: Payer: Self-pay | Admitting: Family

## 2023-01-29 ENCOUNTER — Encounter: Payer: Self-pay | Admitting: Internal Medicine

## 2023-01-29 ENCOUNTER — Other Ambulatory Visit: Payer: Self-pay | Admitting: Internal Medicine

## 2023-01-29 MED ORDER — LEVOTHYROXINE SODIUM 88 MCG PO TABS: 88.0000 ug | ORAL_TABLET | Freq: Every day | ORAL | 1 refills | Status: DC

## 2023-02-10 DIAGNOSIS — I350 Nonrheumatic aortic (valve) stenosis: Secondary | ICD-10-CM | POA: Diagnosis not present

## 2023-02-10 DIAGNOSIS — I10 Essential (primary) hypertension: Secondary | ICD-10-CM | POA: Diagnosis not present

## 2023-02-10 DIAGNOSIS — Z7901 Long term (current) use of anticoagulants: Secondary | ICD-10-CM | POA: Diagnosis not present

## 2023-02-10 DIAGNOSIS — E785 Hyperlipidemia, unspecified: Secondary | ICD-10-CM | POA: Diagnosis not present

## 2023-02-10 DIAGNOSIS — I480 Paroxysmal atrial fibrillation: Secondary | ICD-10-CM | POA: Diagnosis not present

## 2023-02-10 DIAGNOSIS — R42 Dizziness and giddiness: Secondary | ICD-10-CM | POA: Diagnosis not present

## 2023-02-10 LAB — CBC WITH DIFFERENTIAL/PLATELET
Basophils Absolute: 0.1 10*3/uL (ref 0.0–0.2)
Hematocrit: 41.1 % (ref 34.0–46.6)
Immature Grans (Abs): 0 10*3/uL (ref 0.0–0.1)
MCHC: 32.6 g/dL (ref 31.5–35.7)
Neutrophils: 62 %
RBC: 4.3 x10E6/uL (ref 3.77–5.28)

## 2023-02-10 LAB — COMPREHENSIVE METABOLIC PANEL

## 2023-02-10 LAB — LIPID PANEL

## 2023-02-11 LAB — LIPID PANEL
Chol/HDL Ratio: 1.9 ratio (ref 0.0–4.4)
Cholesterol, Total: 179 mg/dL (ref 100–199)
LDL Chol Calc (NIH): 74 mg/dL (ref 0–99)
Triglycerides: 55 mg/dL (ref 0–149)
VLDL Cholesterol Cal: 11 mg/dL (ref 5–40)

## 2023-02-11 LAB — COMPREHENSIVE METABOLIC PANEL
ALT: 31 IU/L (ref 0–32)
Albumin/Globulin Ratio: 1.4 (ref 1.2–2.2)
Albumin: 4 g/dL (ref 3.8–4.8)
Alkaline Phosphatase: 86 IU/L (ref 44–121)
BUN: 16 mg/dL (ref 8–27)
CO2: 24 mmol/L (ref 20–29)
Chloride: 103 mmol/L (ref 96–106)
Creatinine, Ser: 0.68 mg/dL (ref 0.57–1.00)
Potassium: 4.1 mmol/L (ref 3.5–5.2)
Sodium: 140 mmol/L (ref 134–144)
Total Protein: 6.9 g/dL (ref 6.0–8.5)

## 2023-02-11 LAB — HEMOGLOBIN A1C
Est. average glucose Bld gHb Est-mCnc: 111 mg/dL
Hgb A1c MFr Bld: 5.5 % (ref 4.8–5.6)

## 2023-02-11 LAB — CBC WITH DIFFERENTIAL/PLATELET
Basos: 1 %
EOS (ABSOLUTE): 0.1 10*3/uL (ref 0.0–0.4)
Eos: 2 %
Hemoglobin: 13.4 g/dL (ref 11.1–15.9)
Immature Granulocytes: 0 %
Lymphocytes Absolute: 1.1 10*3/uL (ref 0.7–3.1)
Lymphs: 23 %
MCH: 31.2 pg (ref 26.6–33.0)
MCV: 96 fL (ref 79–97)
Monocytes Absolute: 0.6 10*3/uL (ref 0.1–0.9)
Monocytes: 12 %
Neutrophils Absolute: 2.8 10*3/uL (ref 1.4–7.0)
Platelets: 264 10*3/uL (ref 150–450)
RDW: 12.6 % (ref 11.7–15.4)
WBC: 4.6 10*3/uL (ref 3.4–10.8)

## 2023-02-17 NOTE — Progress Notes (Unsigned)
Cardiology Office Note:    Date:  02/18/2023   ID:  Crystal Odom, DOB 12-Jul-1941, MRN [id number]  PCP:  Crystal Headings, MD  [facility] Cardiologist:  Meriam Sprague, MD  [facility] Electrophysiologist:  None   Referring MD: Crystal Headings, MD    History of Present Illness:    Crystal Odom is a 82 y.o. female with a hx of HTN, mild AS, HLD and pAfib who returns to clinic for follow-up  Per review of the record patient has Calcium score of 0 in 2017.  Most recent echocardiogram 06/11/2019 with normal LVEF greater than 65%, indeterminate diastolic parameters, no R WMA, RV normal size and function, subtle MVP with trivial MR, mild aortic sclerosis.  Evaluated October 2021 due to palpitations.  Cardiac monitor was placed which showed episodes of atrial fibrillation.  She was started on Eliquis at that time.   Was last seen in clinic on 07/2022 where she was doing well from a CV standpoint.  Today, the patient overall feels well. No chest pain, SOB, orthopnea or PND. No significant palpitations. Has been tolerating medications as prescribed. BP runs 110-120/60-70s. No bleeding issues on the apixaban.   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 neutropenia   Arthritis    left knee   Diplopia 2002   eval neg   Hyperlipidemia    HDL over 100   Hypothyroidism    Kidney stones    in past   Leukopenia    with neg hemevaluation bm bx get wbc diff q 6 months   UTI (lower urinary tract infection)     Past Surgical History:  Procedure Laterality Date   BONE MARROW BIOPSY     low WBC   CATARACT EXTRACTION, BILATERAL     kidney stone removal     left arthroscopic  2005   left knee   LIVER BIOPSY     reported normal   TONSILLECTOMY     TUBAL LIGATION      Current Medications: Current Meds  Medication Sig   apixaban (ELIQUIS) 5 MG TABS tablet Take 1 tablet (5 mg total) by mouth 2  (two) times daily.   ezetimibe (ZETIA) 10 MG tablet Take 1 tablet (10 mg total) by mouth daily.   levothyroxine (SYNTHROID) 88 MCG tablet Take 1 tablet (88 mcg total) by mouth daily.   metoprolol succinate (TOPROL XL) 25 MG 24 hr tablet Take 0.5 tablets (12.5 mg total) by mouth 2 (two) times daily.   rosuvastatin (CRESTOR) 10 MG tablet Take 1 tablet (10 mg total) by mouth daily.   VITAMIN D PO Take by mouth.     Allergies:   Penicillins   Social History   Socioeconomic History   Marital status: Married    Spouse name: Not on file   Number of children: Not on file   Years of education: Not on file   Highest education level: Bachelor's degree (e.g., BA, AB, BS)  Occupational History   Not on file  Tobacco Use   Smoking status: Never   Smokeless tobacco: Never  Vaping Use   Vaping Use: Never used  Substance and Sexual Activity   Alcohol use: Yes    Alcohol/week: 2.0 - 3.0 standard drinks of alcohol    Types: 2 - 3 Glasses of wine per week    Comment: 2 to 3 glasses a week    Drug use:  No   Sexual activity: Not on file  Other Topics Concern   Not on file  Social History Narrative   Married   Retired  2008   Regular exercise-yes   HH of 2   No pets   Social etoh   G2P2      Husband hac CABG 2012   Travel grandchildren    Social Determinants of Health   Financial Resource Strain: Low Risk  (11/01/2022)   Overall Financial Resource Strain (CARDIA)    Difficulty of Paying Living Expenses: Not hard at all  Food Insecurity: No Food Insecurity (11/01/2022)   Hunger Vital Sign    Worried About Running Out of Food in the Last Year: Never true    Ran Out of Food in the Last Year: Never true  Transportation Needs: No Transportation Needs (11/01/2022)   PRAPARE - Administrator, Civil Service (Medical): No    Lack of Transportation (Non-Medical): No  Physical Activity: Sufficiently Active (11/01/2022)   Exercise Vital Sign    Days of Exercise per Week: 7 days     Minutes of Exercise per Session: 40 min  Stress: No Stress Concern Present (11/01/2022)   [employer] of Occupational Health - Occupational Stress Questionnaire    Feeling of Stress : Not at all  Social Connections: Socially Integrated (11/01/2022)   Social Connection and Isolation Panel [NHANES]    Frequency of Communication with Friends and Family: More than three times a week    Frequency of Social Gatherings with Friends and Family: More than three times a week    Attends Religious Services: More than 4 times per year    Active Member of [employer] or Organizations: Yes    Attends Engineer, structural: More than 4 times per year    Marital Status: Married     Family History: The patient's family history includes Autoimmune disease in her brother and sister; Heart disease in her father and mother; Osteoarthritis in her father; Osteoporosis in an other family member; Other in her father; Stroke in her maternal grandmother and another family member; Thyroid disease in an other family member.  ROS:   Please see the history of present illness.    Review of Systems  Constitutional:  Negative for chills and fever.  HENT:  Negative for nosebleeds.   Eyes:  Negative for blurred vision.  Respiratory:  Negative for shortness of breath.   Cardiovascular:  Negative for chest pain, palpitations, orthopnea, claudication, leg swelling and PND.  Gastrointestinal:  Negative for blood in stool and melena.  Genitourinary:  Negative for dysuria and hematuria.  Skin:  Negative for itching and rash.  Neurological:  Negative for dizziness and loss of consciousness.  Endo/Heme/Allergies:  Negative for polydipsia.  Psychiatric/Behavioral:  Negative for substance abuse. The patient is not nervous/anxious.     EKGs/Labs/Other Studies Reviewed:    The following studies were reviewed today: 08/01/20 Cardiac Monitor: Predominant rhythm is sinus with average HR 62bpm. HR range 38bpm to 220bpm. There  were 59 episodes of SVT with fastest 179bpm lasting 7 beats. Longest episode lasted 15.6 secs with average HR 101bpm. There were several runs of Afib (<1% burden) with longest episode lasting and 10sec with average HR 178. Patient symptomatic at that time. Isolated PACs and PVCs were rare (<1% burden). No NSVT, VT or significant pauses.  TTE 06/11/19: IMPRESSIONS   1. The left ventricle has hyperdynamic systolic function, with an  ejection fraction of >65%. The cavity  size was normal. There is mild  eccentric left ventricular hypertrophy. Left ventricular diastolic Doppler  parameters are indeterminate. The E/e' is  9. No evidence of left ventricular regional wall motion abnormalities.   2. The right ventricle has normal systolic function. The cavity was  normal. There is no increase in right ventricular wall thickness. Right  ventricular systolic pressure is normal.   3. There is systolic bowing of the mitral leaflets, without true  prolapse.   4. The aortic valve is tricuspid. Mild thickening of the aortic valve.  Sclerosis without any evidence of stenosis of the aortic valve. Mild  stenosis of the aortic valve.   5. Focal calcification of noncoronary cusp.   6. The aorta is normal unless otherwise noted.   7. The aortic root, ascending aorta and aortic arch are normal in size  and structure.   EKG:  No new tracing today  Recent Labs: 11/08/2022: TSH 1.43 02/10/2023: ALT 31; BUN 16; Creatinine, Ser 0.68; Hemoglobin 13.4; Platelets 264; Potassium 4.1; Sodium 140  Recent Lipid Panel    Component Value Date/Time   CHOL 179 02/10/2023 0821   TRIG 55 02/10/2023 0821   HDL 94 02/10/2023 0821   CHOLHDL 1.9 02/10/2023 0821   CHOLHDL 2 11/08/2022 0851   VLDL 9.2 11/08/2022 0851   LDLCALC 74 02/10/2023 0821   LDLCALC 161 (H) 06/22/2020 0739   LDLDIRECT 149.6 02/02/2013 1003     Risk Assessment/Calculations:    CHA2DS2-VASc Score = 4  {This indicates a 4.8% annual risk of  stroke. The patient's score is based upon: CHF History: 0 HTN History: 1 Diabetes History: 0 Stroke History: 0 Vascular Disease History: 0 Age Score: 2 Gender Score: 1     Physical Exam:    VS:  BP 124/76   Pulse 66   Ht 5\' 5"  (1.651 m)   Wt 130 lb 6.4 oz (59.1 kg)   SpO2 98%   BMI 21.70 kg/m     Wt Readings from Last 3 Encounters:  02/18/23 130 lb 6.4 oz (59.1 kg)  11/05/22 129 lb 9.6 oz (58.8 kg)  11/01/22 126 lb (57.2 kg)     GEN:  Well nourished, well developed in no acute distress HEENT: Normal NECK: No JVD; No carotid bruits CARDIAC: RRR, 1/6 murmur. No rubs or gallops RESPIRATORY:  CTAB, no wheezes ABDOMEN: Soft, non-tender, non-distended MUSCULOSKELETAL:  No edema; No deformity  SKIN: Warm and dry NEUROLOGIC:  Alert and oriented x 3 PSYCHIATRIC:  Normal affect   ASSESSMENT:    1. Paroxysmal atrial fibrillation (HCC)   2. Chronic anticoagulation   3. Mild aortic stenosis   4. Hyperlipidemia with target LDL less than 100   5. Primary hypertension   6. Elevated LDL cholesterol level      PLAN:    In order of problems listed above:  #Paroxysmal Afib: #Acquired thrombophilia: CHADs-vasc 4. Zio patch placed for palpitations in 2021 revealed runs of Afib. Started on [facility] with apixaban and metop for rate control. Currently, symptoms are well controlled with minimal palpitations. Tolerating apixaban without issues.  -Continue apixaban 5mg  BID -Continue metoprolol 12.5mg  XL BID  #Orthostasis: #Lightheadedness: Resolved with stopping the lisinopril.  -Stopped lisinopril -Counseled about adequate fluid and salt intake -Compression therapy    #Hyperlipidemia: Ca score 0. LFTs rose with crestor 20mg  with no significant change in LDL now back on 10mg  daily and zetia.   -Continue Crestor 10mg  PO daily -Continue zetia 10mg  daily -LDL now 74   #HTN: Well  controlled. -Continue metop for rate control as above -Off lisinopril due to orthostasis  symptoms  #Borderline Mild AS: -Monitor with serial echoes   Medication Adjustments/Labs and Tests Ordered: Current medicines are reviewed at length with the patient today.  Concerns regarding medicines are outlined above.  No orders of the defined types were placed in this encounter.  No orders of the defined types were placed in this encounter.   Patient Instructions  Medication Instructions:   Your physician recommends that you continue on your current medications as directed. Please refer to the Current Medication list given to you today.  *If you need a refill on your cardiac medications before your next appointment, please call your pharmacy*    Follow-Up: At [facility], you and your health needs are our priority.  As part of our continuing mission to provide you with exceptional heart care, we have created designated Provider Care Teams.  These Care Teams include your primary Cardiologist (physician) and Advanced Practice Providers (APPs -  Physician Assistants and Nurse Practitioners) who all work together to provide you with the care you need, when you need it.  We recommend signing up for the patient portal called "MyChart".  Sign up information is provided on this After Visit Summary.  MyChart is used to connect with patients for Virtual Visits (Telemedicine).  Patients are able to view lab/test results, encounter notes, upcoming appointments, etc.  Non-urgent messages can be sent to your provider as well.   To learn more about what you can do with MyChart, go to [email or web address].    Your next appointment:   6 month(s)  Provider:   Jari Favre, PA-C, Ronie Spies, PA-C, Robin Searing, NP, Jacolyn Reedy, PA-C, Eligha Bridegroom, NP, or Tereso Newcomer, PA-C                Signed, Meriam Sprague, MD  02/18/2023 3:30 PM    [facility]

## 2023-02-18 ENCOUNTER — Ambulatory Visit: Payer: PPO | Attending: Cardiology | Admitting: Cardiology

## 2023-02-18 ENCOUNTER — Encounter: Payer: Self-pay | Admitting: Cardiology

## 2023-02-18 VITALS — BP 124/76 | HR 66 | Ht 65.0 in | Wt 130.4 lb

## 2023-02-18 DIAGNOSIS — I350 Nonrheumatic aortic (valve) stenosis: Secondary | ICD-10-CM | POA: Diagnosis not present

## 2023-02-18 DIAGNOSIS — E7800 Pure hypercholesterolemia, unspecified: Secondary | ICD-10-CM | POA: Diagnosis not present

## 2023-02-18 DIAGNOSIS — Z7901 Long term (current) use of anticoagulants: Secondary | ICD-10-CM | POA: Diagnosis not present

## 2023-02-18 DIAGNOSIS — I10 Essential (primary) hypertension: Secondary | ICD-10-CM

## 2023-02-18 DIAGNOSIS — I480 Paroxysmal atrial fibrillation: Secondary | ICD-10-CM

## 2023-02-18 DIAGNOSIS — E785 Hyperlipidemia, unspecified: Secondary | ICD-10-CM | POA: Diagnosis not present

## 2023-02-18 NOTE — Patient Instructions (Signed)
Medication Instructions:   Your physician recommends that you continue on your current medications as directed. Please refer to the Current Medication list given to you today.  *If you need a refill on your cardiac medications before your next appointment, please call your pharmacy*   Follow-Up: At [facility], you and your health needs are our priority.  As part of our continuing mission to provide you with exceptional heart care, we have created designated Provider Care Teams.  These Care Teams include your primary Cardiologist (physician) and Advanced Practice Providers (APPs -  Physician Assistants and Nurse Practitioners) who all work together to provide you with the care you need, when you need it.  We recommend signing up for the patient portal called "MyChart".  Sign up information is provided on this After Visit Summary.  MyChart is used to connect with patients for Virtual Visits (Telemedicine).  Patients are able to view lab/test results, encounter notes, upcoming appointments, etc.  Non-urgent messages can be sent to your provider as well.   To learn more about what you can do with MyChart, go to [email or web address].    Your next appointment:   6 month(s)  Provider:   Tessa Conte, PA-C, Dayna Dunn, PA-C, Ernest Dick, NP, Michele Lenze, PA-C, Michelle Swinyer, NP, or Scott Weaver, PA-C           

## 2023-03-31 ENCOUNTER — Encounter: Payer: Self-pay | Admitting: Internal Medicine

## 2023-04-01 NOTE — Telephone Encounter (Signed)
Please advise 

## 2023-04-02 NOTE — Telephone Encounter (Signed)
Risk of infection is low if attached less than  -72 hours . And unengorged   I suggest  no other   intervention but close observation for fever rashes  or  any symptoms  that  you can't identify   And of course checking for ticks on body daily in this season.

## 2023-04-10 DIAGNOSIS — M1711 Unilateral primary osteoarthritis, right knee: Secondary | ICD-10-CM | POA: Diagnosis not present

## 2023-06-09 ENCOUNTER — Encounter: Payer: Self-pay | Admitting: Internal Medicine

## 2023-06-17 DIAGNOSIS — M25561 Pain in right knee: Secondary | ICD-10-CM | POA: Diagnosis not present

## 2023-06-17 DIAGNOSIS — M25562 Pain in left knee: Secondary | ICD-10-CM | POA: Diagnosis not present

## 2023-07-02 ENCOUNTER — Other Ambulatory Visit: Payer: Self-pay

## 2023-07-02 DIAGNOSIS — I480 Paroxysmal atrial fibrillation: Secondary | ICD-10-CM

## 2023-07-02 MED ORDER — APIXABAN 5 MG PO TABS
5.0000 mg | ORAL_TABLET | Freq: Two times a day (BID) | ORAL | 5 refills | Status: DC
Start: 2023-07-02 — End: 2023-09-02

## 2023-07-02 NOTE — Telephone Encounter (Signed)
Prescription refill request for Eliquis received. Indication:afib Last office visit:5/24 Scr:0.68  5/24 Age: 82 Weight:59.1  kg  Prescription refilled

## 2023-07-03 ENCOUNTER — Other Ambulatory Visit: Payer: Self-pay | Admitting: Internal Medicine

## 2023-07-09 DIAGNOSIS — L812 Freckles: Secondary | ICD-10-CM | POA: Diagnosis not present

## 2023-07-09 DIAGNOSIS — D2371 Other benign neoplasm of skin of right lower limb, including hip: Secondary | ICD-10-CM | POA: Diagnosis not present

## 2023-07-09 DIAGNOSIS — D225 Melanocytic nevi of trunk: Secondary | ICD-10-CM | POA: Diagnosis not present

## 2023-07-09 DIAGNOSIS — L821 Other seborrheic keratosis: Secondary | ICD-10-CM | POA: Diagnosis not present

## 2023-07-09 DIAGNOSIS — D1801 Hemangioma of skin and subcutaneous tissue: Secondary | ICD-10-CM | POA: Diagnosis not present

## 2023-07-10 DIAGNOSIS — M25562 Pain in left knee: Secondary | ICD-10-CM | POA: Diagnosis not present

## 2023-07-29 DIAGNOSIS — M25562 Pain in left knee: Secondary | ICD-10-CM | POA: Diagnosis not present

## 2023-08-06 ENCOUNTER — Other Ambulatory Visit: Payer: Self-pay

## 2023-08-06 ENCOUNTER — Encounter: Payer: Self-pay | Admitting: Internal Medicine

## 2023-08-06 DIAGNOSIS — Z79899 Other long term (current) drug therapy: Secondary | ICD-10-CM

## 2023-08-06 DIAGNOSIS — E7800 Pure hypercholesterolemia, unspecified: Secondary | ICD-10-CM

## 2023-08-06 DIAGNOSIS — I10 Essential (primary) hypertension: Secondary | ICD-10-CM

## 2023-08-06 DIAGNOSIS — I480 Paroxysmal atrial fibrillation: Secondary | ICD-10-CM

## 2023-08-06 DIAGNOSIS — R945 Abnormal results of liver function studies: Secondary | ICD-10-CM

## 2023-08-06 DIAGNOSIS — E785 Hyperlipidemia, unspecified: Secondary | ICD-10-CM

## 2023-08-06 DIAGNOSIS — Z7901 Long term (current) use of anticoagulants: Secondary | ICD-10-CM

## 2023-08-06 NOTE — Telephone Encounter (Signed)
1  OK to use voltaren 2  WOuld recomm she have :   lipomed, lpa, apoB, TSH, A1C, CMET and CBC

## 2023-08-21 ENCOUNTER — Ambulatory Visit: Payer: PPO | Admitting: Nurse Practitioner

## 2023-08-21 DIAGNOSIS — Z79899 Other long term (current) drug therapy: Secondary | ICD-10-CM | POA: Diagnosis not present

## 2023-08-21 DIAGNOSIS — R945 Abnormal results of liver function studies: Secondary | ICD-10-CM | POA: Diagnosis not present

## 2023-08-21 DIAGNOSIS — Z7901 Long term (current) use of anticoagulants: Secondary | ICD-10-CM | POA: Diagnosis not present

## 2023-08-21 DIAGNOSIS — I480 Paroxysmal atrial fibrillation: Secondary | ICD-10-CM | POA: Diagnosis not present

## 2023-08-21 DIAGNOSIS — E785 Hyperlipidemia, unspecified: Secondary | ICD-10-CM | POA: Diagnosis not present

## 2023-08-21 DIAGNOSIS — I10 Essential (primary) hypertension: Secondary | ICD-10-CM | POA: Diagnosis not present

## 2023-08-21 DIAGNOSIS — E7800 Pure hypercholesterolemia, unspecified: Secondary | ICD-10-CM | POA: Diagnosis not present

## 2023-08-22 LAB — CBC
Hematocrit: 43.7 % (ref 34.0–46.6)
Hemoglobin: 14.5 g/dL (ref 11.1–15.9)
MCH: 33 pg (ref 26.6–33.0)
MCHC: 33.2 g/dL (ref 31.5–35.7)
MCV: 100 fL — ABNORMAL HIGH (ref 79–97)
Platelets: 246 10*3/uL (ref 150–450)
RBC: 4.39 x10E6/uL (ref 3.77–5.28)
RDW: 13.4 % (ref 11.7–15.4)
WBC: 3.7 10*3/uL (ref 3.4–10.8)

## 2023-08-22 LAB — COMPREHENSIVE METABOLIC PANEL
ALT: 39 [IU]/L — ABNORMAL HIGH (ref 0–32)
AST: 36 [IU]/L (ref 0–40)
Albumin: 4.1 g/dL (ref 3.8–4.8)
Alkaline Phosphatase: 86 [IU]/L (ref 44–121)
BUN/Creatinine Ratio: 22 (ref 12–28)
BUN: 14 mg/dL (ref 8–27)
Bilirubin Total: 0.3 mg/dL (ref 0.0–1.2)
CO2: 24 mmol/L (ref 20–29)
Calcium: 9.4 mg/dL (ref 8.7–10.3)
Chloride: 104 mmol/L (ref 96–106)
Creatinine, Ser: 0.64 mg/dL (ref 0.57–1.00)
Globulin, Total: 2.7 g/dL (ref 1.5–4.5)
Glucose: 82 mg/dL (ref 70–99)
Potassium: 4 mmol/L (ref 3.5–5.2)
Sodium: 143 mmol/L (ref 134–144)
Total Protein: 6.8 g/dL (ref 6.0–8.5)
eGFR: 89 mL/min/{1.73_m2} (ref >59)

## 2023-08-22 LAB — TSH: TSH: 1.58 u[IU]/mL (ref 0.450–4.500)

## 2023-08-22 LAB — LIPOPROTEIN A (LPA): Lipoprotein (a): 336.9 nmol/L — ABNORMAL HIGH (ref <75.0)

## 2023-08-22 LAB — NMR, LIPOPROFILE
Cholesterol, Total: 195 mg/dL (ref 100–199)
HDL Particle Number: 42.1 umol/L (ref >=30.5)
HDL-C: 107 mg/dL (ref >39)
LDL Particle Number: 570 nmol/L (ref <1000)
LDL Size: 21.2 nm (ref >20.5)
LDL-C (NIH Calc): 78 mg/dL (ref 0–99)
LP-IR Score: <25 (ref <=45)
Small LDL Particle Number: <90 nmol/L (ref <=527)
Triglycerides: 49 mg/dL (ref 0–149)

## 2023-08-22 LAB — HEMOGLOBIN A1C
Est. average glucose Bld gHb Est-mCnc: 120 mg/dL
Hgb A1c MFr Bld: 5.8 % — ABNORMAL HIGH (ref 4.8–5.6)

## 2023-08-22 LAB — APOLIPOPROTEIN B: Apolipoprotein B: 78 mg/dL (ref <90)

## 2023-09-01 NOTE — Progress Notes (Unsigned)
Cardiology Office Note:    Date:  09/02/2023   ID:  Crystal Odom, DOB [birth year], MRN [id number]  PCP:  Madelin Headings, MD  [facility] Cardiologist:  Meriam Sprague, MD (Inactive)  [facility] Electrophysiologist:  None   Referring MD: Madelin Headings, MD    History of Present Illness:    Crystal Odom is a 82 y.o. female with a hx of HTN, mild AS, HLD and pAfib who returns to clinic for follow-up  Per review of the record patient has Calcium score of 0 in 2017.  Most recent echocardiogram 06/11/2019 with normal LVEF greater than 65%, indeterminate diastolic parameters, no R WMA, RV normal size and function, subtle MVP with trivial MR, mild aortic sclerosis.  Evaluated October 2021 due to palpitations.  Cardiac monitor was placed which showed episodes of atrial fibrillation.  She was started on Eliquis at that time.   Was last seen in clinic on 07/2022 where she was doing well from a CV standpoint.  Today, the patient overall feels well. No chest pain, SOB, orthopnea or PND. No significant palpitations. Has been tolerating medications as prescribed. BP runs 110-120/60-70s. No bleeding issues on the apixaban.  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 neutropenia   Arthritis    left knee   Diplopia 2002   eval neg   Hyperlipidemia    HDL over 100   Hypothyroidism    Kidney stones    in past   Leukopenia    with neg hemevaluation bm bx get wbc diff q 6 months   UTI (lower urinary tract infection)     Past Surgical History:  Procedure Laterality Date   BONE MARROW BIOPSY     low WBC   CATARACT EXTRACTION, BILATERAL     kidney stone removal     left arthroscopic  2005   left knee   LIVER BIOPSY      reported normal   TONSILLECTOMY     TUBAL LIGATION      Current Medications: Current Meds  Medication Sig   apixaban (ELIQUIS) 5 MG TABS tablet Take 1 tablet (5 mg total) by mouth 2 (two) times daily.   ezetimibe (ZETIA) 10 MG tablet Take 1 tablet (10 mg total) by mouth daily.   levothyroxine (SYNTHROID) 88 MCG tablet TAKE 1 TABLET BY MOUTH EVERY DAY   metoprolol succinate (TOPROL XL) 25 MG 24 hr tablet Take 0.5 tablets (12.5 mg total) by mouth 2 (two) times daily.   rosuvastatin (CRESTOR) 10 MG tablet Take 1 tablet (10 mg total) by mouth daily.   VITAMIN D PO Take by mouth.     Allergies:   Penicillins   Social History   Socioeconomic History   Marital status: Married    Spouse name: Not on file   Number of children: Not on file   Years of education: Not on file   Highest education level: Bachelor's degree (e.g., BA, AB, BS)  Occupational History   Not on file  Tobacco Use   Smoking status: Never   Smokeless tobacco: Never  Vaping Use   Vaping status: Never Used  Substance and Sexual Activity  Alcohol use: Yes    Alcohol/week: 2.0 - 3.0 standard drinks of alcohol    Types: 2 - 3 Glasses of wine per week    Comment: 2 to 3 glasses a week    Drug use: No   Sexual activity: Not on file  Other Topics Concern   Not on file  Social History Narrative   Married   Retired  2008   Regular exercise-yes   HH of 2   No pets   Social etoh   G2P2      Husband hac CABG 2012   Travel grandchildren    Social Determinants of Health   Financial Resource Strain: Low Risk  (11/01/2022)   Overall Financial Resource Strain (CARDIA)    Difficulty of Paying Living Expenses: Not hard at all  Food Insecurity: No Food Insecurity (11/01/2022)   Hunger Vital Sign    Worried About Running Out of Food in the Last Year: Never true    Ran Out of Food in the Last Year: Never true  Transportation Needs: No Transportation Needs (11/01/2022)   PRAPARE - Administrator, Civil Service  (Medical): No    Lack of Transportation (Non-Medical): No  Physical Activity: Sufficiently Active (11/01/2022)   Exercise Vital Sign    Days of Exercise per Week: 7 days    Minutes of Exercise per Session: 40 min  Stress: No Stress Concern Present (11/01/2022)   [employer] of Occupational Health - Occupational Stress Questionnaire    Feeling of Stress : Not at all  Social Connections: Socially Integrated (11/01/2022)   Social Connection and Isolation Panel [NHANES]    Frequency of Communication with Friends and Family: More than three times a week    Frequency of Social Gatherings with Friends and Family: More than three times a week    Attends Religious Services: More than 4 times per year    Active Member of [employer] or Organizations: Yes    Attends Engineer, structural: More than 4 times per year    Marital Status: Married     Family History: The patient's family history includes Autoimmune disease in her brother and sister; Heart disease in her father and mother; Osteoarthritis in her father; Osteoporosis in an other family member; Other in her father; Stroke in her maternal grandmother and another family member; Thyroid disease in an other family member.  ROS:   Please see the history of present illness.      EKGs/Labs/Other Studies Reviewed:    The following studies were reviewed today: 08/01/20 Cardiac Monitor: Predominant rhythm is sinus with average HR 62bpm. HR range 38bpm to 220bpm. There were 59 episodes of SVT with fastest 179bpm lasting 7 beats. Longest episode lasted 15.6 secs with average HR 101bpm. There were several runs of Afib (<1% burden) with longest episode lasting and 10sec with average HR 178. Patient symptomatic at that time. Isolated PACs and PVCs were rare (<1% burden). No NSVT, VT or significant pauses.  TTE 06/11/19: IMPRESSIONS   1. The left ventricle has hyperdynamic systolic function, with an  ejection fraction of >65%. The cavity  size was normal. There is mild  eccentric left ventricular hypertrophy. Left ventricular diastolic Doppler  parameters are indeterminate. The E/e' is  9. No evidence of left ventricular regional wall motion abnormalities.   2. The right ventricle has normal systolic function. The cavity was  normal. There is no increase in right ventricular wall thickness. Right  ventricular systolic pressure is  normal.   3. There is systolic bowing of the mitral leaflets, without true  prolapse.   4. The aortic valve is tricuspid. Mild thickening of the aortic valve.  Sclerosis without any evidence of stenosis of the aortic valve. Mild  stenosis of the aortic valve.   5. Focal calcification of noncoronary cusp.   6. The aorta is normal unless otherwise noted.   7. The aortic root, ascending aorta and aortic arch are normal in size  and structure.   EKG:  NSR     Recent Labs: 08/21/2023: ALT 39; BUN 14; Creatinine, Ser 0.64; Hemoglobin 14.5; Platelets 246; Potassium 4.0; Sodium 143; TSH 1.580  Recent Lipid Panel    Component Value Date/Time   CHOL 179 02/10/2023 0821   TRIG 55 02/10/2023 0821   HDL 94 02/10/2023 0821   CHOLHDL 1.9 02/10/2023 0821   CHOLHDL 2 11/08/2022 0851   VLDL 9.2 11/08/2022 0851   LDLCALC 74 02/10/2023 0821   LDLCALC 161 (H) 06/22/2020 0739   LDLDIRECT 149.6 02/02/2013 1003         Physical Exam:    VS:  BP (!) 138/90   Pulse 66   Ht 5\' 5"  (1.651 m)   Wt 122 lb 12.8 oz (55.7 kg)   SpO2 99%   BMI 20.43 kg/m     Wt Readings from Last 3 Encounters:  09/02/23 122 lb 12.8 oz (55.7 kg)  02/18/23 130 lb 6.4 oz (59.1 kg)  11/05/22 129 lb 9.6 oz (58.8 kg)     GEN:  Well nourished, well developed in no acute distress HEENT: Normal NECK: No JVD; No carotid bruits CARDIAC: RRR,No murmurs    RESPIRATORY:  CTAB ABDOMEN: Soft, non-tender, non-distended MUSCULOSKELETAL:  No edema; No deformity  SKIN: Warm and dry NEUROLOGIC:  Alert and oriented x 3 PSYCHIATRIC:   Normal affect     PLAN:    In order of problems listed above:  #Paroxysmal Afib: #Acquired thrombophilia: CHADs-vasc 4. Zio patch placed for palpitations in 2021 revealed runs of Afib. Started on [facility] with apixaban and metop for rate control. Currently, symptoms remain well controlled with minimal palpitations.  -Continue apixaban 5mg  BID -Continue metoprolol 12.5mg  XL BID  #Orthostasis: #Lightheadedness: Resolved with stopping the lisinopril. \ Pt denies      #Hyperlipidemia: Ca score 0.Minimal plaquing of aorta though along with high Lpa     LFTs rose with crestor 20mg  with no significant change in LDL now back on 10mg  daily and zetia.   -Continue Crestor 10mg  PO daily -Continue zetia 10mg  daily -LDL now 78  HDL 49   Follow        #HTN: BP a little high the past few days      Follow   If above 140/ call   #Borderline Mild AS: Reviewed echo with pt     Repeat scheduled for next week     Medication Adjustments/Labs and Tests Ordered: Current medicines are reviewed at length with the patient today.  Concerns regarding medicines are outlined above.  Orders Placed This Encounter  Procedures   EKG 12-Lead   No orders of the defined types were placed in this encounter.   Patient Instructions  Medication Instructions:   *If you need a refill on your cardiac medications before your next appointment, please call your pharmacy*   Lab Work:  If you have labs (blood work) drawn today and your tests are completely normal, you will receive your results only by: MyChart Message (if you have  MyChart) OR A paper copy in the mail If you have any lab test that is abnormal or we need to change your treatment, we will call you to review the results.   Testing/Procedures:    Follow-Up: At [facility], you and your health needs are our priority.  As part of our continuing mission to provide you with exceptional heart care, we have created designated Provider Care  Teams.  These Care Teams include your primary Cardiologist (physician) and Advanced Practice Providers (APPs -  Physician Assistants and Nurse Practitioners) who all work together to provide you with the care you need, when you need it.  We recommend signing up for the patient portal called "MyChart".  Sign up information is provided on this After Visit Summary.  MyChart is used to connect with patients for Virtual Visits (Telemedicine).  Patients are able to view lab/test results, encounter notes, upcoming appointments, etc.  Non-urgent messages can be sent to your provider as well.   To learn more about what you can do with MyChart, go to [email or web address].    FOLLOW UP: OCTOBER 2025  Provider:   DR Dietrich Pates     Other Instructions      Signed, Dietrich Pates, MD  09/02/2023 9:04 AM    [facility]

## 2023-09-02 ENCOUNTER — Other Ambulatory Visit: Payer: Self-pay

## 2023-09-02 ENCOUNTER — Ambulatory Visit: Payer: PPO | Attending: Nurse Practitioner | Admitting: Internal Medicine

## 2023-09-02 ENCOUNTER — Encounter: Payer: Self-pay | Admitting: Internal Medicine

## 2023-09-02 VITALS — BP 138/90 | HR 66 | Ht 65.0 in | Wt 122.8 lb

## 2023-09-02 DIAGNOSIS — I480 Paroxysmal atrial fibrillation: Secondary | ICD-10-CM

## 2023-09-02 DIAGNOSIS — Z7901 Long term (current) use of anticoagulants: Secondary | ICD-10-CM

## 2023-09-02 MED ORDER — APIXABAN 5 MG PO TABS: 5.0000 mg | ORAL_TABLET | Freq: Two times a day (BID) | ORAL | 3 refills | Status: DC

## 2023-09-02 MED ORDER — APIXABAN 5 MG PO TABS: 5.0000 mg | ORAL_TABLET | Freq: Two times a day (BID) | ORAL | 5 refills | Status: DC

## 2023-09-02 MED ORDER — METOPROLOL SUCCINATE ER 25 MG PO TB24: 12.5000 mg | ORAL_TABLET | Freq: Two times a day (BID) | ORAL | 3 refills | Status: DC

## 2023-09-02 MED ORDER — ROSUVASTATIN CALCIUM 10 MG PO TABS: 10.0000 mg | ORAL_TABLET | Freq: Every day | ORAL | 3 refills | Status: DC

## 2023-09-02 MED ORDER — EZETIMIBE 10 MG PO TABS: 10.0000 mg | ORAL_TABLET | Freq: Every day | ORAL | 3 refills | Status: DC

## 2023-09-02 NOTE — Patient Instructions (Signed)
Medication Instructions:   *If you need a refill on your cardiac medications before your next appointment, please call your pharmacy*   Lab Work:  If you have labs (blood work) drawn today and your tests are completely normal, you will receive your results only by: MyChart Message (if you have MyChart) OR A paper copy in the mail If you have any lab test that is abnormal or we need to change your treatment, we will call you to review the results.   Testing/Procedures:    Follow-Up: At [facility], you and your health needs are our priority.  As part of our continuing mission to provide you with exceptional heart care, we have created designated Provider Care Teams.  These Care Teams include your primary Cardiologist (physician) and Advanced Practice Providers (APPs -  Physician Assistants and Nurse Practitioners) who all work together to provide you with the care you need, when you need it.  We recommend signing up for the patient portal called "MyChart".  Sign up information is provided on this After Visit Summary.  MyChart is used to connect with patients for Virtual Visits (Telemedicine).  Patients are able to view lab/test results, encounter notes, upcoming appointments, etc.  Non-urgent messages can be sent to your provider as well.   To learn more about what you can do with MyChart, go to [email or web address].    FOLLOW UP: OCTOBER 2025  Provider:   DR Dietrich Pates     Other Instructions

## 2023-09-10 ENCOUNTER — Encounter: Payer: Self-pay | Admitting: Internal Medicine

## 2023-09-10 ENCOUNTER — Ambulatory Visit (HOSPITAL_COMMUNITY): Payer: PPO | Attending: Cardiology

## 2023-09-10 DIAGNOSIS — I77819 Aortic ectasia, unspecified site: Secondary | ICD-10-CM

## 2023-09-10 DIAGNOSIS — I350 Nonrheumatic aortic (valve) stenosis: Secondary | ICD-10-CM | POA: Diagnosis not present

## 2023-09-10 LAB — ECHOCARDIOGRAM COMPLETE
AR max vel: 1.39 cm2
AV Area VTI: 1.38 cm2
AV Area mean vel: 1.48 cm2
AV Mean grad: 10 mm[Hg]
AV Peak grad: 14.5 mm[Hg]
Ao pk vel: 1.91 m/s
Area-P 1/2: 3.79 cm2
MV M vel: 5.5 m/s
MV Peak grad: 121 mm[Hg]
Radius: 0.6 cm
S' Lateral: 2.4 cm

## 2023-09-15 ENCOUNTER — Encounter: Payer: Self-pay | Admitting: Internal Medicine

## 2023-09-18 DIAGNOSIS — M25561 Pain in right knee: Secondary | ICD-10-CM | POA: Diagnosis not present

## 2023-10-10 ENCOUNTER — Other Ambulatory Visit: Payer: Self-pay

## 2023-10-10 ENCOUNTER — Encounter: Payer: Self-pay | Admitting: Internal Medicine

## 2023-10-10 ENCOUNTER — Ambulatory Visit (HOSPITAL_COMMUNITY)
Admission: EM | Admit: 2023-10-10 | Discharge: 2023-10-10 | Disposition: A | Discharge status: Home / Self Care | Payer: PPO | Attending: Emergency Medicine | Admitting: Emergency Medicine

## 2023-10-10 ENCOUNTER — Observation Stay (HOSPITAL_COMMUNITY)
Admission: EM | Admit: 2023-10-10 | Discharge: 2023-10-11 | Disposition: A | Discharge status: Home / Self Care | Payer: PPO | Attending: Cardiology | Admitting: Cardiology

## 2023-10-10 ENCOUNTER — Emergency Department (HOSPITAL_COMMUNITY): Payer: PPO

## 2023-10-10 ENCOUNTER — Encounter (HOSPITAL_COMMUNITY): Payer: Self-pay

## 2023-10-10 ENCOUNTER — Encounter (HOSPITAL_COMMUNITY): Payer: Self-pay | Admitting: *Deleted

## 2023-10-10 DIAGNOSIS — R7989 Other specified abnormal findings of blood chemistry: Secondary | ICD-10-CM | POA: Diagnosis not present

## 2023-10-10 DIAGNOSIS — I2489 Other forms of acute ischemic heart disease: Secondary | ICD-10-CM | POA: Insufficient documentation

## 2023-10-10 DIAGNOSIS — R002 Palpitations: Secondary | ICD-10-CM | POA: Diagnosis present

## 2023-10-10 DIAGNOSIS — E785 Hyperlipidemia, unspecified: Secondary | ICD-10-CM | POA: Diagnosis not present

## 2023-10-10 DIAGNOSIS — I480 Paroxysmal atrial fibrillation: Secondary | ICD-10-CM | POA: Diagnosis not present

## 2023-10-10 DIAGNOSIS — E039 Hypothyroidism, unspecified: Secondary | ICD-10-CM | POA: Insufficient documentation

## 2023-10-10 DIAGNOSIS — R42 Dizziness and giddiness: Secondary | ICD-10-CM

## 2023-10-10 DIAGNOSIS — I4891 Unspecified atrial fibrillation: Secondary | ICD-10-CM

## 2023-10-10 DIAGNOSIS — R531 Weakness: Secondary | ICD-10-CM | POA: Diagnosis not present

## 2023-10-10 DIAGNOSIS — Z7901 Long term (current) use of anticoagulants: Secondary | ICD-10-CM | POA: Insufficient documentation

## 2023-10-10 DIAGNOSIS — K529 Noninfective gastroenteritis and colitis, unspecified: Secondary | ICD-10-CM

## 2023-10-10 DIAGNOSIS — I10 Essential (primary) hypertension: Secondary | ICD-10-CM | POA: Insufficient documentation

## 2023-10-10 DIAGNOSIS — J111 Influenza due to unidentified influenza virus with other respiratory manifestations: Secondary | ICD-10-CM | POA: Diagnosis not present

## 2023-10-10 DIAGNOSIS — R778 Other specified abnormalities of plasma proteins: Secondary | ICD-10-CM | POA: Diagnosis not present

## 2023-10-10 HISTORY — DX: Unspecified atrial fibrillation: I48.91

## 2023-10-10 HISTORY — DX: Noninfective gastroenteritis and colitis, unspecified: K52.9

## 2023-10-10 LAB — CBC
HCT: 46.6 % — ABNORMAL HIGH (ref 36.0–46.0)
Hemoglobin: 15.8 g/dL — ABNORMAL HIGH (ref 12.0–15.0)
MCH: 32.9 pg (ref 26.0–34.0)
MCHC: 33.9 g/dL (ref 30.0–36.0)
MCV: 97.1 fL (ref 80.0–100.0)
Platelets: 250 10*3/uL (ref 150–400)
RBC: 4.8 MIL/uL (ref 3.87–5.11)
RDW: 14.1 % (ref 11.5–15.5)
WBC: 4.1 10*3/uL (ref 4.0–10.5)
nRBC: 0 % (ref 0.0–0.2)

## 2023-10-10 LAB — BASIC METABOLIC PANEL
Anion gap: 12 (ref 5–15)
BUN: 22 mg/dL (ref 8–23)
CO2: 23 mmol/L (ref 22–32)
Calcium: 9.1 mg/dL (ref 8.9–10.3)
Chloride: 98 mmol/L (ref 98–111)
Creatinine, Ser: 0.92 mg/dL (ref 0.44–1.00)
GFR, Estimated: >60 mL/min (ref >60)
Glucose, Bld: 104 mg/dL — ABNORMAL HIGH (ref 70–99)
Potassium: 3.7 mmol/L (ref 3.5–5.1)
Sodium: 133 mmol/L — ABNORMAL LOW (ref 135–145)

## 2023-10-10 LAB — TROPONIN I (HIGH SENSITIVITY)
Troponin I (High Sensitivity): 298 ng/L (ref <18)
Troponin I (High Sensitivity): 343 ng/L (ref <18)

## 2023-10-10 LAB — MAGNESIUM: Magnesium: 1.9 mg/dL (ref 1.7–2.4)

## 2023-10-10 MED ORDER — METOPROLOL TARTRATE 5 MG/5ML IV SOLN
5.0000 mg | Freq: Once | INTRAVENOUS | Status: AC
  Administered 2023-10-10: 5 mg via INTRAVENOUS
  Filled 2023-10-10: qty 5

## 2023-10-10 MED ORDER — METOPROLOL TARTRATE 5 MG/5ML IV SOLN
INTRAVENOUS | Status: AC
  Administered 2023-10-10: 5 mg via INTRAVENOUS
  Filled 2023-10-10: qty 5

## 2023-10-10 MED ORDER — PROPOFOL 10 MG/ML IV BOLUS
INTRAVENOUS | Status: AC | PRN
  Administered 2023-10-10: 30 mg via INTRAVENOUS

## 2023-10-10 MED ORDER — APIXABAN 5 MG PO TABS: 5.0000 mg | ORAL_TABLET | Freq: Two times a day (BID) | ORAL | Status: DC

## 2023-10-10 MED ORDER — LEVOTHYROXINE SODIUM 88 MCG PO TABS
88.0000 ug | ORAL_TABLET | Freq: Every day | ORAL | Status: DC
Start: 2023-10-11 — End: 2023-10-11
  Administered 2023-10-11: 88 ug via ORAL
  Filled 2023-10-10: qty 1

## 2023-10-10 MED ORDER — SODIUM CHLORIDE 0.9 % IV BOLUS
1000.0000 mL | Freq: Once | INTRAVENOUS | Status: AC
  Administered 2023-10-10: 1000 mL via INTRAVENOUS

## 2023-10-10 MED ORDER — METOPROLOL SUCCINATE ER 25 MG PO TB24
12.5000 mg | ORAL_TABLET | Freq: Two times a day (BID) | ORAL | Status: DC
  Administered 2023-10-10 – 2023-10-11 (×2): 12.5 mg via ORAL
  Filled 2023-10-10 (×2): qty 1

## 2023-10-10 MED ORDER — APIXABAN 2.5 MG PO TABS
2.5000 mg | ORAL_TABLET | Freq: Two times a day (BID) | ORAL | Status: DC
  Administered 2023-10-10 – 2023-10-11 (×2): 2.5 mg via ORAL
  Filled 2023-10-10 (×2): qty 1

## 2023-10-10 MED ORDER — ONDANSETRON HCL 4 MG/2ML IJ SOLN: 4.0000 mg | Freq: Four times a day (QID) | INTRAMUSCULAR | Status: DC | PRN

## 2023-10-10 MED ORDER — PROPOFOL 10 MG/ML IV BOLUS
0.5000 mg/kg | Freq: Once | INTRAVENOUS | Status: DC
  Filled 2023-10-10: qty 20

## 2023-10-10 MED ORDER — ROSUVASTATIN CALCIUM 5 MG PO TABS
10.0000 mg | ORAL_TABLET | Freq: Every day | ORAL | Status: DC
  Administered 2023-10-10 – 2023-10-11 (×2): 10 mg via ORAL
  Filled 2023-10-10 (×2): qty 2

## 2023-10-10 MED ORDER — ACETAMINOPHEN 325 MG PO TABS: 650.0000 mg | ORAL_TABLET | ORAL | Status: DC | PRN

## 2023-10-10 MED ORDER — EZETIMIBE 10 MG PO TABS
10.0000 mg | ORAL_TABLET | Freq: Every day | ORAL | Status: DC
  Administered 2023-10-10 – 2023-10-11 (×2): 10 mg via ORAL
  Filled 2023-10-10 (×2): qty 1

## 2023-10-10 NOTE — Sedation Documentation (Signed)
 Pt shocked with 150J by Dr Jacinto Halim

## 2023-10-10 NOTE — ED Triage Notes (Signed)
 The pt has not been feeling well for 3-4 days  she has a history of af intermittently she feels like she may be dehydrated  she has just had the flu

## 2023-10-10 NOTE — ED Notes (Signed)
 Patient transported to x-ray. ?

## 2023-10-10 NOTE — ED Notes (Signed)
 Patient is being discharged from the Urgent Care and sent to the Emergency Department via personal opperated vehicle . Per [facility] PA, patient is in need of higher level of care due to AFIB with dizziness. Patient is aware and verbalizes understanding of plan of care.  Vitals:   10/10/23 1318  BP: 116/73  Pulse: (!) 148  Resp: 18  Temp: 97.8 F (36.6 C)  SpO2: 98%

## 2023-10-10 NOTE — ED Provider Notes (Signed)
 [place] EMERGENCY DEPARTMENT AT [facility] Provider Note   CSN: [id number] Arrival date & time: 10/10/23  1406     History  Chief Complaint  Patient presents with   Tachycardia    Crystal Odom is a 83 y.o. female.  HPI   Patient has a history of hypothyroidism hyperlipidemia neutropenia leukopenia and atrial fibrillation.  Patient states for the last couple of days she has been having a bad GI illness.  Patient's had multiple episodes of vomiting and diarrhea.  Those symptoms have resolved today but now her husband is ill.  Patient however states she used her Crist mobile device because she started to feel lightheaded and dehydrated and it read that her heart rate was very tachycardic.  She called the cardiologist office and was directed to go to an urgent care to have her heart rate checked.  While there patient was noted to have atrial fibrillation versus atrial flutter with tachycardia.  Home Medications Prior to Admission medications   Medication Sig Start Date End Date Taking? Authorizing Provider  apixaban  (ELIQUIS ) 5 MG TABS tablet Take 1 tablet (5 mg total) by mouth 2 (two) times daily. 09/02/23   Okey Vina GAILS, MD  ezetimibe  (ZETIA ) 10 MG tablet Take 1 tablet (10 mg total) by mouth daily. 09/02/23   Okey Vina GAILS, MD  levothyroxine  (SYNTHROID ) 88 MCG tablet TAKE 1 TABLET BY MOUTH EVERY DAY 07/03/23   Webb, Padonda B, FNP  metoprolol  succinate (TOPROL  XL) 25 MG 24 hr tablet Take 0.5 tablets (12.5 mg total) by mouth 2 (two) times daily. 09/02/23   Okey Vina GAILS, MD  rosuvastatin  (CRESTOR ) 10 MG tablet Take 1 tablet (10 mg total) by mouth daily. 09/02/23   Okey Vina GAILS, MD  VITAMIN D  PO Take by mouth.    [provider]      Allergies    Penicillins    Review of Systems   Review of Systems  Physical Exam Updated Vital Signs BP 119/70   Pulse 89   Temp 98 F (36.7 C) (Oral)   Resp 15   Ht 1.537 m (5' 0.5)   Wt 55.7 kg   SpO2 100%   BMI  23.59 kg/m  Physical Exam Vitals and nursing note reviewed.  Constitutional:      General: She is not in acute distress.    Appearance: She is well-developed.  HENT:     Head: Normocephalic and atraumatic.     Right Ear: External ear normal.     Left Ear: External ear normal.  Eyes:     General: No scleral icterus.       Right eye: No discharge.        Left eye: No discharge.     Conjunctiva/sclera: Conjunctivae normal.  Neck:     Trachea: No tracheal deviation.  Cardiovascular:     Rate and Rhythm: Tachycardia present. Rhythm irregular.  Pulmonary:     Effort: Pulmonary effort is normal. No respiratory distress.     Breath sounds: Normal breath sounds. No stridor. No wheezing or rales.  Abdominal:     General: Bowel sounds are normal. There is no distension.     Palpations: Abdomen is soft.     Tenderness: There is no abdominal tenderness. There is no guarding or rebound.  Musculoskeletal:        General: No tenderness or deformity.     Cervical back: Neck supple.  Skin:    General: Skin is warm and dry.  Findings: No rash.  Neurological:     General: No focal deficit present.     Mental Status: She is alert.     Cranial Nerves: No cranial nerve deficit, dysarthria or facial asymmetry.     Sensory: No sensory deficit.     Motor: No abnormal muscle tone or seizure activity.     Coordination: Coordination normal.  Psychiatric:        Mood and Affect: Mood normal.     ED Results / Procedures / Treatments   Labs (all labs ordered are listed, but only abnormal results are displayed) Labs Reviewed  BASIC METABOLIC PANEL - Abnormal; Notable for the following components:      Result Value   Sodium 133 (*)    Glucose, Bld 104 (*)    All other components within normal limits  CBC - Abnormal; Notable for the following components:   Hemoglobin 15.8 (*)    HCT 46.6 (*)    All other components within normal limits  TROPONIN I (HIGH SENSITIVITY) - Abnormal; Notable for  the following components:   Troponin I (High Sensitivity) 298 (*)    All other components within normal limits  BASIC METABOLIC PANEL  LIPID PANEL  MAGNESIUM   TROPONIN I (HIGH SENSITIVITY)    EKG EKG Interpretation Date/Time:  Friday October 10 2023 15:19:19 EST Ventricular Rate:  140 PR Interval:    QRS Duration:  84 QT Interval:  306 QTC Calculation: 467 R Axis:   67  Text Interpretation: Atrial flutter with variable A-V block Marked ST abnormality, possible anterolateral subendocardial injury Abnormal ECG rate fast since dec 3 20204, a flutter is new Confirmed by Randol Simmonds [phone]) on 10/10/2023 3:31:06 PM  Radiology DG Chest 2 View Result Date: 10/10/2023 CLINICAL DATA:  Weakness.  Flu. EXAM: CHEST - 2 VIEW COMPARISON:  None Available. FINDINGS: Bilateral lung fields are clear. Bilateral costophrenic angles are clear. Normal cardio-mediastinal silhouette. No acute osseous abnormalities. The soft tissues are within normal limits. IMPRESSION: No active cardiopulmonary disease. Electronically Signed   By: Ree Molt M.D.   On: 10/10/2023 16:03    Procedures .Critical Care  Performed by: Randol Simmonds, MD Authorized by: Randol Simmonds, MD   Critical care provider statement:    Critical care time (minutes):  30   Critical care was time spent personally by me on the following activities:  Development of treatment plan with patient or surrogate, discussions with consultants, evaluation of patient's response to treatment, examination of patient, ordering and review of laboratory studies, ordering and review of radiographic studies, ordering and performing treatments and interventions, pulse oximetry, re-evaluation of patient's condition and review of old charts .Sedation  Date/Time: 10/10/2023 6:22 PM  Performed by: Randol Simmonds, MD Authorized by: Randol Simmonds, MD   Consent:    Consent obtained:  Verbal   Consent given by:  Patient   Risks discussed:  Allergic reaction, dysrhythmia,  vomiting, respiratory compromise necessitating ventilatory assistance and intubation, prolonged sedation necessitating reversal and prolonged hypoxia resulting in organ damage   Alternatives discussed:  Analgesia without sedation Universal protocol:    Immediately prior to procedure, a time out was called: yes   Indications:    Procedure performed:  Cardioversion   Procedure necessitating sedation performed by:  Different physician Pre-sedation assessment:    Time since last food or drink:  5   ASA classification: class 1 - normal, healthy patient     Mouth opening:  2 finger widths   Thyromental distance:  3  finger widths   Mallampati score:  II - soft palate, uvula, fauces visible   Neck mobility: normal     Pre-sedation assessments completed and reviewed: airway patency, cardiovascular function, hydration status, mental status, nausea/vomiting, pain level, respiratory function and temperature   A pre-sedation assessment was completed prior to the start of the procedure Immediate pre-procedure details:    Reassessment: Patient reassessed immediately prior to procedure     Reviewed: vital signs     Verified: bag valve mask available, emergency equipment available, intubation equipment available, IV patency confirmed, oxygen available and reversal medications available   Procedure details (see MAR for exact dosages):    Preoxygenation:  Nasal cannula   Sedation:  Propofol    Intended level of sedation: deep   Intra-procedure monitoring:  Blood pressure monitoring, cardiac monitor, continuous pulse oximetry, continuous capnometry, frequent LOC assessments and frequent vital sign checks   Intra-procedure events: none     Total Provider sedation time (minutes):  10 Post-procedure details:   A post-sedation assessment was completed following the completion of the procedure.   Attendance: Constant attendance by certified staff until patient recovered     Recovery: Patient returned to  pre-procedure baseline     Post-sedation assessments completed and reviewed: airway patency, cardiovascular function, hydration status, mental status, nausea/vomiting, pain level, respiratory function and temperature     Patient is stable for discharge or admission: yes     Procedure completion:  Tolerated well, no immediate complications     Medications Ordered in ED Medications  propofol  (DIPRIVAN ) 10 mg/mL bolus/IV push 27.9 mg (has no administration in time range)  propofol  (DIPRIVAN ) 10 mg/mL bolus/IV push (30 mg Intravenous Given 10/10/23 1810)  acetaminophen  (TYLENOL ) tablet 650 mg (has no administration in time range)  ondansetron  (ZOFRAN ) injection 4 mg (has no administration in time range)  sodium chloride  0.9 % bolus 1,000 mL (0 mLs Intravenous Stopped 10/10/23 1725)  metoprolol  tartrate (LOPRESSOR ) injection 5 mg (5 mg Intravenous Given 10/10/23 1815)    ED Course/ Medical Decision Making/ A&P Clinical Course as of 10/10/23 1823  Fri Oct 10, 2023  1659 CBC and metabolic panel are unremarkable.  Patient's troponin is elevated to 98 [JK]  1700 Chest x-ray without acute findings [JK]  1706 Heart rate has improved down into the 90s.  Patient denies any chest pain at this time [JK]  1727 Case discussed with Dr Michele.  Medicine will admit.   Will recheck ekg [JK]    Clinical Course User Index [JK] Randol Simmonds, MD                                 Medical Decision Making Amount and/or Complexity of Data Reviewed Labs: ordered. Radiology: ordered.  Risk Prescription drug management. Decision regarding hospitalization.   Patient presented to the ED for evaluation of a rapid heart rate.  Patient has history of atrial fibrillation.  In the ED noted to have heart rate in the 140s.  Patient has been given IV metoprolol  with improvement in her heart rate.  Patient recently had a GI bug but there is no signs of severe dehydration or electrolyte abnormality.  Troponins are elevated.  It  is possible this is related to underlying ischemia versus demand ischemia from her tachycardia.  Will consult with cardiology  Pt seen with Dr Michele and [facility] cardiology.  Pt cardioverted in the ED.  Will admit to cardiology service for monitoring, further  evaluation        Final Clinical Impression(s) / ED Diagnoses Final diagnoses:  Atrial fibrillation with RVR (HCC)  Elevated troponin    Rx / DC Orders ED Discharge Orders          Ordered    Amb referral to AFIB Clinic        10/10/23 TRENNA Randol Simmonds, MD 10/10/23 1824

## 2023-10-10 NOTE — H&P (Signed)
 CARDIOLOGY ADMIT NOTE     Patient ID: Crystal Odom MRN: [id number] DOB/AGE: [birth year] 83 y.o.  Admit date: 10/10/2023.   Primary Physician:  Okey Vina GAILS, MD  Patient ID: Crystal Odom, female    DOB: January 10, 1941, 83 y.o.   MRN: [id number]  Chief Complaint  Patient presents with   Tachycardia   HPI:    Crystal Odom  is a 83 y.o. Caucasian female patient was very active, with primary hypertension, mild apical ischemia, mild aortic stenosis, paroxysmal atrial fibrillation had been having nausea, vomiting and diarrhea for the last 2 days, however woke up this morning feeling rapid palpitations.  In view of this, not feeling well, presented to urgent care as her home monitoring revealed EKG with RVR.  In the urgent care A-fib with RVR was confirmed and she was made to come to the emergency room.  Except for palpitations, she has no other complaints.  Denies chest pain or dyspnea.  States that her diarrhea is improving and she has not had any more nausea or vomiting this afternoon.  No fever or chills.  Past Medical History:  Diagnosis Date   A-fib (HCC)    Abnormal LFTs    nl liver bx positive antismooth muscle antibodies and neutropenia   Arthritis    left knee   Diplopia 09/30/2000   eval neg   Hyperlipidemia    HDL over 100   Hypothyroidism    Kidney stones    in past   Leukopenia    with neg hemevaluation bm bx get wbc diff q 6 months   UTI (lower urinary tract infection)    Past Surgical History:  Procedure Laterality Date   BONE MARROW BIOPSY     low WBC   CATARACT EXTRACTION, BILATERAL     kidney stone removal     left arthroscopic  2005   left knee   LIVER BIOPSY     reported normal   TONSILLECTOMY     TUBAL LIGATION     Social History   Tobacco Use   Smoking status: Never   Smokeless tobacco: Never  Substance Use Topics   Alcohol use: Yes    Alcohol/week: 2.0 - 3.0 standard drinks of alcohol    Types: 2 - 3 Glasses of wine per week     Comment: 2 to 3 glasses a week    Marital status: Married  ROS  Review of Systems  Cardiovascular:  Positive for palpitations. Negative for chest pain, dyspnea on exertion and leg swelling.  Gastrointestinal:  Positive for diarrhea.   Objective      10/10/2023    5:00 PM 10/10/2023    4:53 PM 10/10/2023    4:52 PM  Vitals with BMI  Systolic 118    Diastolic 69    Pulse 96 87 106    Physical Exam Neck:     Vascular: No carotid bruit or JVD.  Cardiovascular:     Rate and Rhythm: Tachycardia present. Rhythm irregular.     Pulses: Normal pulses and intact distal pulses.     Heart sounds: No murmur heard. Pulmonary:     Effort: Pulmonary effort is normal.     Breath sounds: Normal breath sounds.  Abdominal:     General: Bowel sounds are normal.     Palpations: Abdomen is soft.  Musculoskeletal:     Right lower leg: No edema.     Left lower leg: No edema.  Skin:    Capillary Refill: Capillary refill takes  less than 2 seconds.    Laboratory examination:   Recent Labs    02/10/23 0821 08/21/23 0812 10/10/23 1532  NA 140 143 133*  K 4.1 4.0 3.7  CL 103 104 98  CO2 24 24 23   GLUCOSE 91 82 104*  BUN 16 14 22   CREATININE 0.68 0.64 0.92  CALCIUM  9.2 9.4 9.1  GFRNONAA  --   --  >60   estimated creatinine clearance is 36 mL/min (by C-G formula based on SCr of 0.92 mg/dL).     Latest Ref Rng & Units 10/10/2023    3:32 PM 08/21/2023    8:12 AM 02/10/2023    8:21 AM  CMP  Glucose 70 - 99 mg/dL 895  82  91   BUN 8 - 23 mg/dL 22  14  16    Creatinine 0.44 - 1.00 mg/dL 9.07  9.35  9.31   Sodium 135 - 145 mmol/L 133  143  140   Potassium 3.5 - 5.1 mmol/L 3.7  4.0  4.1   Chloride 98 - 111 mmol/L 98  104  103   CO2 22 - 32 mmol/L 23  24  24    Calcium  8.9 - 10.3 mg/dL 9.1  9.4  9.2   Total Protein 6.0 - 8.5 g/dL  6.8  6.9   Total Bilirubin 0.0 - 1.2 mg/dL  0.3  0.6   Alkaline Phos 44 - 121 IU/L  86  86   AST 0 - 40 IU/L  36  33   ALT 0 - 32 IU/L  39  31        Latest Ref Rng & Units 10/10/2023    3:32 PM 08/21/2023    8:12 AM 02/10/2023    8:21 AM  CBC  WBC 4.0 - 10.5 K/uL 4.1  3.7  4.6   Hemoglobin 12.0 - 15.0 g/dL 84.1  85.4  86.5   Hematocrit 36.0 - 46.0 % 46.6  43.7  41.1   Platelets 150 - 400 K/uL 250  246  264    Lipid Panel     Component Value Date/Time   CHOL 179 02/10/2023 0821   TRIG 55 02/10/2023 0821   HDL 94 02/10/2023 0821   CHOLHDL 1.9 02/10/2023 0821   CHOLHDL 2 11/08/2022 0851   VLDL 9.2 11/08/2022 0851   LDLCALC 74 02/10/2023 0821   LDLCALC 161 (H) 06/22/2020 0739   LDLDIRECT 149.6 02/02/2013 1003   HEMOGLOBIN A1C Lab Results  Component Value Date   HGBA1C 5.8 (H) 08/21/2023   TSH Recent Labs    11/08/22 0851 08/21/23 0812  TSH 1.43 1.580   BNP (last 3 results) No results for input(s): BNP in the last 8760 hours. Cardiac Panel (last 3 results) Recent Labs    10/10/23 1532  TROPONINIHS 298*    Medications and allergies   Allergies  Allergen Reactions   Penicillins     hives    Prior to Admission medications   Medication Sig Start Date End Date Taking? Authorizing Provider  apixaban  (ELIQUIS ) 5 MG TABS tablet Take 1 tablet (5 mg total) by mouth 2 (two) times daily. 09/02/23   Okey Vina GAILS, MD  ezetimibe  (ZETIA ) 10 MG tablet Take 1 tablet (10 mg total) by mouth daily. 09/02/23   Okey Vina GAILS, MD  levothyroxine  (SYNTHROID ) 88 MCG tablet TAKE 1 TABLET BY MOUTH EVERY DAY 07/03/23   Webb, Padonda B, FNP  metoprolol  succinate (TOPROL  XL) 25 MG 24 hr tablet Take 0.5 tablets (12.5 mg  total) by mouth 2 (two) times daily. 09/02/23   Okey Vina GAILS, MD  rosuvastatin  (CRESTOR ) 10 MG tablet Take 1 tablet (10 mg total) by mouth daily. 09/02/23   Okey Vina GAILS, MD  VITAMIN D  PO Take by mouth.    [provider]    Current Outpatient Medications  Medication Instructions   apixaban  (ELIQUIS ) 5 mg, Oral, 2 times daily   ezetimibe  (ZETIA ) 10 mg, Oral, Daily   levothyroxine  (SYNTHROID ) 88 mcg, Oral, Daily    metoprolol  succinate (TOPROL  XL) 12.5 mg, Oral, 2 times daily   rosuvastatin  (CRESTOR ) 10 mg, Oral, Daily   VITAMIN D  PO Take by mouth.   No intake/output data recorded. Total I/O In: 1000 [IV Piggyback:1000] Out: -    Radiology:  DG Chest 2 View Result Date: 10/10/2023 CLINICAL DATA:  Weakness.  Flu. EXAM: CHEST - 2 VIEW COMPARISON:  None Available. FINDINGS: Bilateral lung fields are clear. Bilateral costophrenic angles are clear. Normal cardio-mediastinal silhouette. No acute osseous abnormalities. The soft tissues are within normal limits. IMPRESSION: No active cardiopulmonary disease. Electronically Signed   By: Ree Molt M.D.   On: 10/10/2023 16:03    Cardiac Studies:   EKG 10/10/2023: A-fib with RVR, inferolateral ST segment depression suggestive of ischemia versus rate related ST-T changes.  Assessment   1.  Paroxysmal atrial fibrillation 2.  Hypercholesterolemia 3.  Primary hypertension 4.  Acute gastroenteritis 5.  Positive serum troponin, probably type II MI  Recommendations:   Patient has no chest pain or dyspnea, no clinical evidence of heart failure, suspect her elevated troponin is type II MI.  She is on anticoagulation appropriately and has not missed any doses send last oral intake was at 11 AM this morning.  Best option is to proceed with direct-current cardioversion to maintain sinus rhythm, she will need admission for observation overnight in view of elevated troponins and also to exclude significant dehydration as she was having nausea and vomiting and diarrhea for the last 2 days which is now improved.  Will also manage her electrolytes while in the hospital.  She is on appropriately treated with rosuvastatin  and Crestor  for hypercholesterolemia, will check levels.  Blood pressure is normal on metoprolol , will continue the same.  Schedule for Direct current cardioversion. I have discussed regarding risks benefits rate control vs rhythm control with the  patient. Patient understands cardiac arrest and need for CPR, aspiration pneumonia, but not limited to these. Patient is willing.     Crystal Bergamo, MD, [facility] 10/10/2023, 5:51 PM Office: [phone] Fax: [phone] Pager: [phone]

## 2023-10-10 NOTE — CV Procedure (Addendum)
 Direct current cardioversion 10/10/2023 6:17 PM  Indication symptomatic A. Fibrillation.  Procedure: Using 30 mg of IV Propofol  for achieving deep sedation, synchronized direct current cardioversion performed. Patient was delivered with 150 joules of electricity X 1 with success to NSR. Patient tolerated the procedure well. No immediate complication noted.   Patient evaluated postprocedure, alert and oriented x 3, remains asymptomatic.  Will admit the patient for observation, follow-up on the troponin, will be discharged home in the mornings remained stable.  Gordy Bergamo, MD, [facility] 10/10/2023, 6:17 PM [facility] [address] [place], [place] [address] Phone: [phone]. Fax:  [phone]

## 2023-10-10 NOTE — ED Triage Notes (Signed)
 According to Patient she is lightheaded, maybe in AFIB x today. Patient states she is better now than she was earlier, her bp was 92/68 and hr 150. Patient has history of asymptomatic afib. States her cardiac monitor at home flagged that she may be in afib today.   No chest pain, SOB. Patient does feel like her heart is racing.    Her husband and the Patient did have a gi virus and could not keep in fluids down Wednesday. Diarrhea and emesis stopped yesterday.

## 2023-10-10 NOTE — ED Provider Notes (Signed)
 [facility]    CSN: [id number] Arrival date & time: 10/10/23  1255     History   Chief Complaint Chief Complaint  Patient presents with   Dizziness   Tachycardia    HPI Crystal Odom is a 83 y.o. female.  Presents at request of her cardiologist to have EKG performed This morning felt lightheaded, and checked her home heart monitor which was reading afib Reports checking her BP and systolic was 80s-90s Currently she feels lightheaded and heart racing She is not having chest pain or shortness of breath  Currently getting over GI virus. Emesis and diarrhea stopped yesterday. She has not been orally hydrating No fevers  Past Medical History:  Diagnosis Date   A-fib (HCC)    Abnormal LFTs    nl liver bx positive antismooth muscle antibodies and neutropenia   Arthritis    left knee   Diplopia 09/30/2000   eval neg   Hyperlipidemia    HDL over 100   Hypothyroidism    Kidney stones    in past   Leukopenia    with neg hemevaluation bm bx get wbc diff q 6 months   UTI (lower urinary tract infection)     Patient Active Problem List   Diagnosis Date Noted   Incidental pulmonary nodule, > 3mm and < 8mm 06/27/2016   Agatston coronary artery calcium  score less than 100  0 06/27/2016   Aortic valve calcification 06/27/2016   Medicare annual wellness visit, subsequent 05/02/2014   Personal history of kidney stones 01/09/2012   Abnormal LFTs    Leukopenia    Family history of autoimmune disorder 02/06/2011   UNSPECIFIED ARTHOPATHY, HAND 10/03/2009   Chest pain 12/05/2008   LIVER FUNCTION TESTS, ABNORMAL 10/31/2008   NEUTROPENIA UNSPECIFIED 08/10/2007   Disorder of bone and cartilage 08/10/2007   Hypothyroidism 06/16/2007   Hyperlipidemia with target LDL less than 100 06/16/2007    Past Surgical History:  Procedure Laterality Date   BONE MARROW BIOPSY     low WBC   CATARACT EXTRACTION, BILATERAL     kidney stone removal     left arthroscopic  2005    left knee   LIVER BIOPSY     reported normal   TONSILLECTOMY     TUBAL LIGATION      OB History   No obstetric history on file.      Home Medications    Prior to Admission medications   Medication Sig Start Date End Date Taking? Authorizing Provider  apixaban  (ELIQUIS ) 5 MG TABS tablet Take 1 tablet (5 mg total) by mouth 2 (two) times daily. 09/02/23  Yes Okey Vina GAILS, MD  ezetimibe  (ZETIA ) 10 MG tablet Take 1 tablet (10 mg total) by mouth daily. 09/02/23  Yes Okey Vina GAILS, MD  levothyroxine  (SYNTHROID ) 88 MCG tablet TAKE 1 TABLET BY MOUTH EVERY DAY 07/03/23  Yes Webb, Padonda B, FNP  metoprolol  succinate (TOPROL  XL) 25 MG 24 hr tablet Take 0.5 tablets (12.5 mg total) by mouth 2 (two) times daily. 09/02/23  Yes Okey Vina GAILS, MD  rosuvastatin  (CRESTOR ) 10 MG tablet Take 1 tablet (10 mg total) by mouth daily. 09/02/23  Yes Okey Vina GAILS, MD  VITAMIN D  PO Take by mouth.   Yes [provider]    Family History Family History  Problem Relation Age of Onset   Other Father        major renal failure; SCCA in ear   Osteoarthritis Father  Heart disease Father    Heart disease Mother        73   Autoimmune disease Sister    Autoimmune disease Brother    Stroke Maternal Grandmother    Thyroid  disease Other    Stroke Other    Osteoporosis Other     Social History Social History   Tobacco Use   Smoking status: Never   Smokeless tobacco: Never  Vaping Use   Vaping status: Never Used  Substance Use Topics   Alcohol use: Yes    Alcohol/week: 2.0 - 3.0 standard drinks of alcohol    Types: 2 - 3 Glasses of wine per week    Comment: 2 to 3 glasses a week    Drug use: No     Allergies   Penicillins   Review of Systems Review of Systems Per HPI  Physical Exam Triage Vital Signs ED Triage Vitals  Encounter Vitals Group     BP      Systolic BP Percentile      Diastolic BP Percentile      Pulse      Resp      Temp      Temp src      SpO2      Weight       Height      Head Circumference      Peak Flow      Pain Score      Pain Loc      Pain Education      Exclude from Growth Chart    No data found.  Updated Vital Signs BP 116/73 (BP Location: Left Arm)   Pulse (!) 148   Temp 97.8 F (36.6 C) (Oral)   Resp 18   Ht 5' 4.5 (1.638 m)   Wt 122 lb 12.7 oz (55.7 kg)   SpO2 98%   BMI 20.75 kg/m    Physical Exam Vitals and nursing note reviewed.  Constitutional:      General: She is not in acute distress.    Appearance: She is not ill-appearing.     Comments: Appears younger than stated age  HENT:     Mouth/Throat:     Mouth: Mucous membranes are moist.     Pharynx: Oropharynx is clear.  Eyes:     Conjunctiva/sclera: Conjunctivae normal.  Cardiovascular:     Rate and Rhythm: Tachycardia present. Rhythm irregularly irregular.  Pulmonary:     Effort: Pulmonary effort is normal.     Breath sounds: Normal breath sounds.  Skin:    General: Skin is warm and dry.  Neurological:     Mental Status: She is alert and oriented to person, place, and time.     UC Treatments / Results  Labs (all labs ordered are listed, but only abnormal results are displayed) Labs Reviewed - No data to display  EKG  Radiology No results found.  Procedures Procedures   Medications Ordered in UC Medications - No data to display  Initial Impression / Assessment and Plan / UC Course  I have reviewed the triage vital signs and the nursing notes.  Pertinent labs & imaging results that were available during my care of the patient were reviewed by me and considered in my medical decision making (see chart for details).  EKG atrial fibrillation, ventricular rate 144 BPM Second EKG reading with possible SVT Based on physical exam palpation and auscultation suspect a-fib with RVR. With patient symptomatic I have advised she be  evaluated in the emergency department. Neighbor will transport her via POV  Final Clinical Impressions(s) / UC  Diagnoses   Final diagnoses:  Atrial fibrillation with RVR (HCC)  Lightheadedness   Discharge Instructions   None    ED Prescriptions   None    PDMP not reviewed this encounter.   Khaleel Beckom, [place], [place] 10/10/23 1409

## 2023-10-10 NOTE — ED Notes (Signed)
 Pt assisted to [facility]

## 2023-10-11 DIAGNOSIS — I4891 Unspecified atrial fibrillation: Secondary | ICD-10-CM | POA: Diagnosis not present

## 2023-10-11 LAB — BASIC METABOLIC PANEL
Anion gap: 7 (ref 5–15)
BUN: 18 mg/dL (ref 8–23)
CO2: 24 mmol/L (ref 22–32)
Calcium: 8.6 mg/dL — ABNORMAL LOW (ref 8.9–10.3)
Chloride: 105 mmol/L (ref 98–111)
Creatinine, Ser: 0.79 mg/dL (ref 0.44–1.00)
GFR, Estimated: >60 mL/min (ref >60)
Glucose, Bld: 98 mg/dL (ref 70–99)
Potassium: 3.7 mmol/L (ref 3.5–5.1)
Sodium: 136 mmol/L (ref 135–145)

## 2023-10-11 LAB — LIPID PANEL
Cholesterol: 134 mg/dL (ref 0–200)
HDL: 57 mg/dL (ref >40)
LDL Cholesterol: 54 mg/dL (ref 0–99)
Total CHOL/HDL Ratio: 2.4 {ratio}
Triglycerides: 117 mg/dL (ref <150)
VLDL: 23 mg/dL (ref 0–40)

## 2023-10-11 LAB — MRSA NEXT GEN BY PCR, NASAL: MRSA by PCR Next Gen: NOT DETECTED

## 2023-10-11 LAB — TROPONIN I (HIGH SENSITIVITY): Troponin I (High Sensitivity): 223 ng/L (ref <18)

## 2023-10-11 MED ORDER — LEVOTHYROXINE SODIUM 88 MCG PO TABS: 88.0000 ug | ORAL_TABLET | Freq: Every morning | ORAL | Status: DC

## 2023-10-11 MED ORDER — POTASSIUM CHLORIDE CRYS ER 20 MEQ PO TBCR
40.0000 meq | EXTENDED_RELEASE_TABLET | Freq: Once | ORAL | Status: AC
  Administered 2023-10-11: 40 meq via ORAL
  Filled 2023-10-11: qty 2

## 2023-10-11 MED ORDER — ROSUVASTATIN CALCIUM 10 MG PO TABS: 10.0000 mg | ORAL_TABLET | Freq: Every day | ORAL | Status: DC

## 2023-10-11 MED ORDER — MAGNESIUM SULFATE 2 GM/50ML IV SOLN
2.0000 g | Freq: Once | INTRAVENOUS | Status: AC
Start: 2023-10-11 — End: 2023-10-11
  Administered 2023-10-11: 2 g via INTRAVENOUS
  Filled 2023-10-11: qty 50

## 2023-10-11 MED ORDER — EZETIMIBE 10 MG PO TABS: 10.0000 mg | ORAL_TABLET | Freq: Every day | ORAL | Status: DC

## 2023-10-11 NOTE — Progress Notes (Signed)
 Rounding Note    Patient Name: Crystal Odom Date of Encounter: 10/11/2023  [facility] Cardiologist: Tenny Craw  Subjective   PT feeling much better   No CP  No SOB  No dizzines  Inpatient Medications    Scheduled Meds:  apixaban  2.5 mg Oral BID   ezetimibe  10 mg Oral Daily   levothyroxine  88 mcg Oral Daily   metoprolol succinate  12.5 mg Oral BID   potassium chloride  40 mEq Oral Once   rosuvastatin  10 mg Oral Daily   Continuous Infusions:  magnesium sulfate bolus IVPB     PRN Meds: acetaminophen, ondansetron (ZOFRAN) IV   Vital Signs    Vitals:   10/11/23 0000 10/11/23 0400 10/11/23 0807 10/11/23 1124  BP: 109/63 112/61 123/75 132/67  Pulse: 92 84 88 61  Resp:  16 18 18   Temp:  98 F (36.7 C) 98 F (36.7 C) 98 F (36.7 C)  TempSrc:  Oral Oral Oral  SpO2: 94% 94% 97% 97%  Weight:      Height:        Intake/Output Summary (Last 24 hours) at 10/11/2023 1209 Last data filed at 10/11/2023 0800 Gross per 24 hour  Intake 1477 ml  Output --  Net 1477 ml      10/10/2023    7:04 PM 10/10/2023    3:23 PM 10/10/2023    1:18 PM  Last 3 Weights  Weight (lbs) 121 lb 12.8 oz 122 lb 12.7 oz 122 lb 12.7 oz  Weight (kg) 55.248 kg 55.7 kg 55.7 kg      Telemetry    SR - Personally Reviewed  ECG    No new  - Personally Reviewed  Physical Exam   GEN: No acute distress.   Neck: No JVD Cardiac: RRR, no murmur Respiratory: Clear to auscultation GI: Soft, nontender Ext   No edema  Labs    High Sensitivity Troponin:   Recent Labs  Lab 10/10/23 1532 10/10/23 1927  TROPONINIHS 298* 343*     Chemistry Recent Labs  Lab 10/10/23 1532 10/10/23 1927 10/11/23 0211  NA 133*  --  136  K 3.7  --  3.7  CL 98  --  105  CO2 23  --  24  GLUCOSE 104*  --  98  BUN 22  --  18  CREATININE 0.92  --  0.79  CALCIUM 9.1  --  8.6*  MG  --  1.9  --   GFRNONAA >60  --  >60  ANIONGAP 12  --  7    Lipids  Recent Labs  Lab 10/11/23 0211  CHOL 134   TRIG 117  HDL 57  LDLCALC 54  CHOLHDL 2.4    Hematology Recent Labs  Lab 10/10/23 1532  WBC 4.1  RBC 4.80  HGB 15.8*  HCT 46.6*  MCV 97.1  MCH 32.9  MCHC 33.9  RDW 14.1  PLT 250   Thyroid No results for input(s): "TSH", "FREET4" in the last 168 hours.  BNPNo results for input(s): "BNP", "PROBNP" in the last 168 hours.  DDimer No results for input(s): "DDIMER" in the last 168 hours.   Radiology    DG Chest 2 View Result Date: 10/10/2023 CLINICAL DATA:  Weakness.  Flu. EXAM: CHEST - 2 VIEW COMPARISON:  None Available. FINDINGS: Bilateral lung fields are clear. Bilateral costophrenic angles are clear. Normal cardio-mediastinal silhouette. No acute osseous abnormalities. The soft tissues are within normal limits. IMPRESSION: No active  cardiopulmonary disease. Electronically Signed   By: Jules Schick M.D.   On: 10/10/2023 16:03    Cardiac Studies   Echo  Dec 2024   1. Left ventricular ejection fraction, by estimation, is 60 to 65%. The  left ventricle has normal function. The left ventricle has no regional  wall motion abnormalities. Left ventricular diastolic parameters are  consistent with Grade I diastolic  dysfunction (impaired relaxation).   2. Right ventricular systolic function is normal. The right ventricular  size is normal. There is normal pulmonary artery systolic pressure. The  estimated right ventricular systolic pressure is 31.7 mmHg.   3. Left atrial size was mildly dilated.   4. The mitral valve is normal in structure. Mild mitral valve  regurgitation. No evidence of mitral stenosis.   5. The aortic valve is tricuspid. There is moderate calcification of the  aortic valve. Aortic valve regurgitation is not visualized. Mild aortic  valve stenosis. Aortic valve mean gradient measures 10.0 mmHg.   6. The inferior vena cava is normal in size with greater than 50%  respiratory variability, suggesting right atrial pressure of 3 mmHg.   Patient Profile      83 y.o. female hx of PAF  Admitted with afib with RVR  Assessment & Plan    1 PAF  Pt hs intermittent atrial fib  This episode started as pt was recovering from a severe viral GI disorder     I think this was the trigger    She is s/p cardioversion   Feeling much better    For now keep on same meds   2  Elevated troponin 298, 343  Third trop ordered  Most likely due to demand in setting of afib with RVR    3  HTN  BP is controlled     4  HL  PT on Crestor and Zetia  LDL 78      Will have pt ambulatd with assistance   If does OK and trop not severely increased will d/c home today    For questions or updates, please contact [facility] Please consult [email or web address] for contact info under        Signed, Dietrich Pates, MD  10/11/2023, 12:09 PM

## 2023-10-11 NOTE — Discharge Summary (Signed)
 Discharge Summary    Patient ID: Atiya Odom MRN: [id number]; DOB: 1941/08/11  Admit date: 10/10/2023 Discharge date: 10/11/2023  PCP:  Charlett Apolinar POUR, MD   [facility] Providers Cardiologist:  Vina Gull, MD        Discharge Diagnoses    Principal Problem:   Atrial fibrillation with RVR [facility]) Active Problems:   Demand ischemia [facility])   Acute gastroenteritis  Diagnostic Studies/Procedures    Direct Current Cardioversion 10/10/2023 _____________   History of Present Illness     Crystal Odom is a 83 y.o. female with a history of paroxysmal atrial fibrillation, hypertension, hyperlipidemia, mild aortic stenosis who presented to the hospital with rapid palpitations in the setting of acute GI illness. He was found to be in AFib with RVR. Her hsTroponin was mildly elevated. She underwent urgent DCCV to restore NSR. She was admitted overnight for continued surveillance of cardiac enzymes.   Hospital Course     Consultants: None    She remained in NSR overnight. This episode of AFib was felt to be secondary to recent acute GI illness. Her follow up hsTroponins remained flat (298 >> 343 >> 223. This was felt to all be from demand ischemia. She ambulated with nursing and was feeling well. Dr. Gull saw the patient today and felt that she is stable and ready for DC to home. Of note, the patient is 83 years old and weight 55 kg. She should be on Eliquis  2.5 mg twice daily based upon weight and age. She was taking 5 mg at home. The dose was adjusted here, but given her recent DCCV, will instruct her to remain on 5 mg until follow up. We will change her to Eliquis  2.5 mg at follow up (1 month after DCCV).     Did the patient have an acute coronary syndrome (MI, NSTEMI, STEMI, etc) this admission?:  No.   The elevated Troponin was due to the acute medical illness (demand ischemia).  _____________  Discharge Vitals Blood pressure 132/67, pulse 61, temperature 98 F  (36.7 C), temperature source Oral, resp. rate 18, height 5' 4 (1.626 m), weight 55.2 kg, SpO2 97%.  Filed Weights   10/10/23 1523 10/10/23 1904  Weight: 55.7 kg 55.2 kg    Labs & Radiologic Studies    CBC Recent Labs    10/10/23 1532  WBC 4.1  HGB 15.8*  HCT 46.6*  MCV 97.1  PLT 250   Basic Metabolic Panel Recent Labs    98/89/74 1532 10/10/23 1927 10/11/23 0211  NA 133*  --  136  K 3.7  --  3.7  CL 98  --  105  CO2 23  --  24  GLUCOSE 104*  --  98  BUN 22  --  18  CREATININE 0.92  --  0.79  CALCIUM  9.1  --  8.6*  MG  --  1.9  --    High Sensitivity Troponin:   Recent Labs  Lab 10/10/23 1532 10/10/23 1927 10/11/23 1308  TROPONINIHS 298* 343* 223*    Fasting Lipid Panel Recent Labs    10/11/23 0211  CHOL 134  HDL 57  LDLCALC 54  TRIG 117  CHOLHDL 2.4   _____________  DG Chest 2 View Result Date: 10/10/2023 CLINICAL DATA:  Weakness.  Flu. EXAM: CHEST - 2 VIEW COMPARISON:  None Available. FINDINGS: Bilateral lung fields are clear. Bilateral costophrenic angles are clear. Normal cardio-mediastinal silhouette. No acute osseous abnormalities. The soft tissues are within normal limits.  IMPRESSION: No active cardiopulmonary disease. Electronically Signed   By: Ree Molt M.D.   On: 10/10/2023 16:03   Disposition   Pt is being discharged home today in good condition.  Follow-up Plans & Appointments     Follow-up Information     Lelon Glendia DASEN, PA-C Follow up on 11/04/2023.   Specialties: Cardiology, Physician Assistant Why: Post hospital follow up with PA for Dr. Okey at 1:55 PM. Please arrive 15 mins early. Contact information: [address] [place] [address] [phone]                Discharge Instructions     Diet - low sodium heart healthy   Complete by: As directed    Driving Restrictions   Complete by: As directed    No driving for 3 days   Increase activity slowly   Complete by: As directed    No  wound care   Complete by: As directed         Discharge Medications   Allergies as of 10/11/2023       Reactions   Penicillins    hives        Medication List     TAKE these medications    acetaminophen  500 MG tablet Commonly known as: TYLENOL  Take 1,000 mg by mouth every 6 (six) hours as needed for moderate pain (pain score 4-6) or mild pain (pain score 1-3).   apixaban  5 MG Tabs tablet Commonly known as: Eliquis  Take 1 tablet (5 mg total) by mouth 2 (two) times daily.   ezetimibe  10 MG tablet Commonly known as: ZETIA  Take 1 tablet (10 mg total) by mouth at bedtime.   levothyroxine  88 MCG tablet Commonly known as: SYNTHROID  Take 1 tablet (88 mcg total) by mouth in the morning.   metoprolol  succinate 25 MG 24 hr tablet Commonly known as: Toprol  XL Take 0.5 tablets (12.5 mg total) by mouth 2 (two) times daily.   rosuvastatin  10 MG tablet Commonly known as: CRESTOR  Take 1 tablet (10 mg total) by mouth at bedtime.   VITAMIN D  PO Take 1 tablet by mouth at bedtime.           Outstanding Labs/Studies   Duration of Discharge Encounter: APP Time: 39 minutes   Signed, Glendia Lelon, PA-C 10/11/2023, 4:00 PM

## 2023-10-11 NOTE — H&P (View-Only) (Signed)
Rounding Note    Patient Name: Crystal Odom Date of Encounter: 10/11/2023  [facility] Cardiologist: Tenny Craw  Subjective   PT feeling much better   No CP  No SOB  No dizzines  Inpatient Medications    Scheduled Meds:  apixaban  2.5 mg Oral BID   ezetimibe  10 mg Oral Daily   levothyroxine  88 mcg Oral Daily   metoprolol succinate  12.5 mg Oral BID   potassium chloride  40 mEq Oral Once   rosuvastatin  10 mg Oral Daily   Continuous Infusions:  magnesium sulfate bolus IVPB     PRN Meds: acetaminophen, ondansetron (ZOFRAN) IV   Vital Signs    Vitals:   10/11/23 0000 10/11/23 0400 10/11/23 0807 10/11/23 1124  BP: 109/63 112/61 123/75 132/67  Pulse: 92 84 88 61  Resp:  16 18 18   Temp:  98 F (36.7 C) 98 F (36.7 C) 98 F (36.7 C)  TempSrc:  Oral Oral Oral  SpO2: 94% 94% 97% 97%  Weight:      Height:        Intake/Output Summary (Last 24 hours) at 10/11/2023 1209 Last data filed at 10/11/2023 0800 Gross per 24 hour  Intake 1477 ml  Output --  Net 1477 ml      10/10/2023    7:04 PM 10/10/2023    3:23 PM 10/10/2023    1:18 PM  Last 3 Weights  Weight (lbs) 121 lb 12.8 oz 122 lb 12.7 oz 122 lb 12.7 oz  Weight (kg) 55.248 kg 55.7 kg 55.7 kg      Telemetry    SR - Personally Reviewed  ECG    No new  - Personally Reviewed  Physical Exam   GEN: No acute distress.   Neck: No JVD Cardiac: RRR, no murmur Respiratory: Clear to auscultation GI: Soft, nontender Ext   No edema  Labs    High Sensitivity Troponin:   Recent Labs  Lab 10/10/23 1532 10/10/23 1927  TROPONINIHS 298* 343*     Chemistry Recent Labs  Lab 10/10/23 1532 10/10/23 1927 10/11/23 0211  NA 133*  --  136  K 3.7  --  3.7  CL 98  --  105  CO2 23  --  24  GLUCOSE 104*  --  98  BUN 22  --  18  CREATININE 0.92  --  0.79  CALCIUM 9.1  --  8.6*  MG  --  1.9  --   GFRNONAA >60  --  >60  ANIONGAP 12  --  7    Lipids  Recent Labs  Lab 10/11/23 0211  CHOL 134   TRIG 117  HDL 57  LDLCALC 54  CHOLHDL 2.4    Hematology Recent Labs  Lab 10/10/23 1532  WBC 4.1  RBC 4.80  HGB 15.8*  HCT 46.6*  MCV 97.1  MCH 32.9  MCHC 33.9  RDW 14.1  PLT 250   Thyroid No results for input(s): "TSH", "FREET4" in the last 168 hours.  BNPNo results for input(s): "BNP", "PROBNP" in the last 168 hours.  DDimer No results for input(s): "DDIMER" in the last 168 hours.   Radiology    DG Chest 2 View Result Date: 10/10/2023 CLINICAL DATA:  Weakness.  Flu. EXAM: CHEST - 2 VIEW COMPARISON:  None Available. FINDINGS: Bilateral lung fields are clear. Bilateral costophrenic angles are clear. Normal cardio-mediastinal silhouette. No acute osseous abnormalities. The soft tissues are within normal limits. IMPRESSION: No active  cardiopulmonary disease. Electronically Signed   By: Jules Schick M.D.   On: 10/10/2023 16:03    Cardiac Studies   Echo  Dec 2024   1. Left ventricular ejection fraction, by estimation, is 60 to 65%. The  left ventricle has normal function. The left ventricle has no regional  wall motion abnormalities. Left ventricular diastolic parameters are  consistent with Grade I diastolic  dysfunction (impaired relaxation).   2. Right ventricular systolic function is normal. The right ventricular  size is normal. There is normal pulmonary artery systolic pressure. The  estimated right ventricular systolic pressure is 31.7 mmHg.   3. Left atrial size was mildly dilated.   4. The mitral valve is normal in structure. Mild mitral valve  regurgitation. No evidence of mitral stenosis.   5. The aortic valve is tricuspid. There is moderate calcification of the  aortic valve. Aortic valve regurgitation is not visualized. Mild aortic  valve stenosis. Aortic valve mean gradient measures 10.0 mmHg.   6. The inferior vena cava is normal in size with greater than 50%  respiratory variability, suggesting right atrial pressure of 3 mmHg.   Patient Profile      83 y.o. female hx of PAF  Admitted with afib with RVR  Assessment & Plan    1 PAF  Pt hs intermittent atrial fib  This episode started as pt was recovering from a severe viral GI disorder     I think this was the trigger    She is s/p cardioversion   Feeling much better    For now keep on same meds   2  Elevated troponin 298, 343  Third trop ordered  Most likely due to demand in setting of afib with RVR    3  HTN  BP is controlled     4  HL  PT on Crestor and Zetia  LDL 78      Will have pt ambulatd with assistance   If does OK and trop not severely increased will d/c home today    For questions or updates, please contact [facility] Please consult [email or web address] for contact info under        Signed, Dietrich Pates, MD  10/11/2023, 12:09 PM

## 2023-10-11 NOTE — Care Management Obs Status (Signed)
 MEDICARE OBSERVATION STATUS NOTIFICATION   Patient Details  Name: Crystal Odom MRN: [id number] Date of Birth: Apr 11, 1941   Medicare Observation Status Notification Given:  Yes Spoke with patient by phone. Explained form to patient. Unable to obtain patient signature due to off site management. A copy of form given to floor nurse to give to patient at bedside.   Robynn Eileen Hoose, RN 10/11/2023, 3:07 PM

## 2023-10-23 ENCOUNTER — Encounter: Payer: Self-pay | Admitting: Internal Medicine

## 2023-10-23 ENCOUNTER — Ambulatory Visit: Payer: PPO | Attending: Internal Medicine | Admitting: Internal Medicine

## 2023-10-23 ENCOUNTER — Telehealth: Payer: Self-pay | Admitting: Internal Medicine

## 2023-10-23 VITALS — BP 124/80 | HR 138 | Ht 64.0 in | Wt 121.2 lb

## 2023-10-23 DIAGNOSIS — R42 Dizziness and giddiness: Secondary | ICD-10-CM

## 2023-10-23 DIAGNOSIS — I480 Paroxysmal atrial fibrillation: Secondary | ICD-10-CM

## 2023-10-23 MED ORDER — AMIODARONE HCL 200 MG PO TABS: 400.0000 mg | ORAL_TABLET | Freq: Two times a day (BID) | ORAL | 1 refills | Status: DC

## 2023-10-23 NOTE — Telephone Encounter (Signed)
Patient c/o Palpitations:  STAT if patient reporting lightheadedness, shortness of breath, or chest pain  How long have you had palpitations/irregular HR/ Afib? Are you having the symptoms now?   Yes - rapid  Are you currently experiencing lightheadedness, SOB or CP?   A little lightheaded  Do you have a history of afib (atrial fibrillation) or irregular heart rhythm?   Yes  Have you checked your BP or HR? (document readings if available):  HR - 138-140   BP 97/76  Are you experiencing any other symptoms?    No  Patient stated she had rapid heart rate about 2 weeks ago and was in ER for dehydration.  Patient stated this episode started this morning.  Patient noted she took her morning dose of metoprolol about 30 minutes ago.

## 2023-10-23 NOTE — Telephone Encounter (Signed)
Called patient back about message. Patient complained of being in A. FIB and having lightheadedness. Patient stated she is not dehydrated at this time, that she has been eating and drinking plenty. Patient stated that her Crystal Odom shows she is in A. FIB with HR 138. Patient's BP is 97/76. Discussed with Dr. Lynnette Caffey, DOD. He suggested patient could see him at his next available slot. Made patient an appointment with DOD.

## 2023-10-23 NOTE — Progress Notes (Signed)
Cardiology Office Note:   Date:  10/23/2023  ID:  Crystal Odom, DOB 02-17-1941, MRN [id number] PCP:  Madelin Headings, MD  [facility] Providers Dr. Rennis Petty the day cardiologist:  Alverda Skeans, MD Primary cardiologist: Dietrich Pates, MD Referring MD: Madelin Headings, MD  Chief Complaint/Reason for Referral: Dr. of the day visit for atrial fibrillation with a rapid ventricular rate. ASSESSMENT:    1. PAF (paroxysmal atrial fibrillation) (HCC)   2. Dizziness     PLAN:   In order of problems listed above: Paroxysmal atrial fibrillation: The patient failed cardioversion earlier this month.  Will start amiodarone 400 mg twice daily and continue her current dose of Toprol.  She has missed no doses of her Eliquis recently.  Will refer for cardioversion next week.  She has follow-up the week thereafter. Dizziness: She has had no frank syncope and no severe dizziness.  Monitor for now.  I have asked her to call our office if she develops any worrisome symptoms.        Informed Consent   Shared Decision Making/Informed Consent The risks (stroke, cardiac arrhythmias rarely resulting in the need for a temporary or permanent pacemaker, skin irritation or burns and complications associated with conscious sedation including aspiration, arrhythmia, respiratory failure and death), benefits (restoration of normal sinus rhythm) and alternatives of a direct current cardioversion were explained in detail to Crystal Odom and she agrees to proceed.        Dispo:  No follow-ups on file.      Medication Adjustments/Labs and Tests Ordered: Current medicines are reviewed at length with the patient today.  Concerns regarding medicines are outlined above.  The following changes have been made:     Labs/tests ordered: Orders Placed This Encounter  Procedures   EKG 12-Lead    Medication Changes: Meds ordered this encounter  Medications   amiodarone (PACERONE) 200 MG tablet    Sig: Take 2  tablets (400 mg total) by mouth 2 (two) times daily.    Dispense:  360 tablet    Refill:  1    Current medicines are reviewed at length with the patient today.  The patient does not have concerns regarding medicines.  I spent 33 minutes reviewing all clinical data during and prior to this visit including all relevant imaging studies, laboratories, clinical information from other health systems and prior notes from both Cardiology and other specialties, interviewing the patient, conducting a complete physical examination, and coordinating care in order to formulate a comprehensive and personalized evaluation and treatment plan.   History of Present Illness:      FOCUSED PROBLEM LIST:   Paroxysmal atrial fibrillation CV 2 score of 3 On Eliquis CV 1/10 Hyperlipidemia Aortic stenosis MG 10 mmHg, EF 60 to 65% TTE December 2024 Hypothyroidism  The patient is an 83 year old female with the above listed medical problems cared for by Dr. Tenny Craw.  She contacted the office today with increasing palpitations and lightheadedness.  She is seen on an expedited basis for Dr. of the day evaluation.  The patient had presented to the emergency department after contracting norovirus.  She had palpitations and EKG demonstrated atrial fibrillation with a rapid ventricular rate.  She underwent cardioversion to normal sinus rhythm.  She was discharged home.  She is doing well up until this morning when she noticed a high heart rate.  She was symptomatic with this.  She had some lightheadedness but no chest pain.  For this reason she came in today  to be seen.  Her EKG demonstrates atrial fibrillation with a rapid ventricular rate.  She denies any frank syncope.  She has had some mild lightheadedness.  She has had no chest pain breathing difficulties.  Occasionally she does feel the palpitations.          Current Medications: Current Meds  Medication Sig   acetaminophen (TYLENOL) 500 MG tablet Take 1,000 mg by  mouth every 6 (six) hours as needed for moderate pain (pain score 4-6) or mild pain (pain score 1-3).   amiodarone (PACERONE) 200 MG tablet Take 2 tablets (400 mg total) by mouth 2 (two) times daily.   apixaban (ELIQUIS) 5 MG TABS tablet Take 1 tablet (5 mg total) by mouth 2 (two) times daily.   ezetimibe (ZETIA) 10 MG tablet Take 1 tablet (10 mg total) by mouth at bedtime.   levothyroxine (SYNTHROID) 88 MCG tablet Take 1 tablet (88 mcg total) by mouth in the morning.   metoprolol succinate (TOPROL XL) 25 MG 24 hr tablet Take 0.5 tablets (12.5 mg total) by mouth 2 (two) times daily.   rosuvastatin (CRESTOR) 10 MG tablet Take 1 tablet (10 mg total) by mouth at bedtime.   VITAMIN D PO Take 1 tablet by mouth at bedtime.     Review of Systems:   Please see the history of present illness.    All other systems reviewed and are negative.     EKGs/Labs/Other Test Reviewed:   EKG:    EKG Interpretation Date/Time:  Thursday October 23 2023 10:07:25 EST Ventricular Rate:  138 PR Interval:    QRS Duration:  84 QT Interval:  302 QTC Calculation: 457 R Axis:   87  Text Interpretation: Atrial fibrillation with rapid ventricular response Marked ST abnormality, possible inferior subendocardial injury When compared with ECG of 10-Oct-2023 18:14, PREVIOUS ECG IS PRESENT Confirmed by Alverda Skeans ([id number]) on 10/23/2023 10:34:40 AM         Risk Assessment/Calculations:    CHA2DS2-VASc Score = 3   This indicates a 3.2% annual risk of stroke. The patient's score is based upon: CHF History: 0 HTN History: 0 Diabetes History: 0 Stroke History: 0 Vascular Disease History: 0 Age Score: 2 Gender Score: 1         Physical Exam:   VS:  BP 124/80   Pulse (!) 138   Ht 5\' 4"  (1.626 m)   Wt 121 lb 3.2 oz (55 kg)   SpO2 98%   BMI 20.80 kg/m        Wt Readings from Last 3 Encounters:  10/23/23 121 lb 3.2 oz (55 kg)  10/10/23 121 lb 12.8 oz (55.2 kg)  10/10/23 122 lb 12.7 oz (55.7 kg)       GENERAL:  No apparent distress, AOx3 HEENT:  No carotid bruits, +2 carotid impulses, no scleral icterus CAR: Irregular RR tachycardic no murmurs, no gallops, rubs, or thrills RES:  Clear to auscultation bilaterally ABD:  Soft, nontender, nondistended, positive bowel sounds x 4 VASC:  +2 radial pulses, +2 carotid pulses NEURO:  CN 2-12 grossly intact; motor and sensory grossly intact PSYCH:  No active depression or anxiety EXT:  No edema, ecchymosis, or cyanosis  Signed, Orbie Pyo, MD  10/23/2023 11:22 AM    [facility] [address] [place], [place], [place]  [address] Phone: [phone]; Fax: [phone]   Note:  This document was prepared using Dragon voice recognition software and may include unintentional dictation errors.

## 2023-10-23 NOTE — Patient Instructions (Signed)
Medication Instructions:  Your physician has recommended you make the following change in your medication:   1) START amiodarone 400 mg twice daily  *If you need a refill on your cardiac medications before your next appointment, please call your pharmacy*  Lab Work: None ordered today.  Testing/Procedures: None ordered today.  Follow-Up: At [facility], you and your health needs are our priority.  As part of our continuing mission to provide you with exceptional heart care, we have created designated Provider Care Teams.  These Care Teams include your primary Cardiologist (physician) and Advanced Practice Providers (APPs -  Physician Assistants and Nurse Practitioners) who all work together to provide you with the care you need, when you need it.  Your next appointment:   2 week(s)  The format for your next appointment:   In Person  Provider:   Tereso Newcomer, PA-C   Other Instructions     Dear Crystal Odom  You are scheduled for a Cardioversion on Friday, January 31 with Dr. [name].  Please arrive at the [facility] (Main Entrance A) at [facility]: [address] [place], [place] [address] at 8:15 AM (This time is 1.5 hour(s) before your procedure to ensure your preparation).   Free valet parking service is available. You will check in at ADMITTING.   *Please Note: You will receive a call the day before your procedure to confirm the appointment time. That time may have changed from the original time based on the schedule for that day.*   DIET:  Nothing to eat or drink after midnight except a sip of water with medications (see medication instructions below)  MEDICATION INSTRUCTIONS: !!IF ANY NEW MEDICATIONS ARE STARTED AFTER TODAY, PLEASE NOTIFY YOUR PROVIDER AS SOON AS POSSIBLE!!  FYI: Medications such as Semaglutide (Ozempic, [place]), Tirzepatide (Mounjaro, Zepbound), Dulaglutide (Trulicity), etc ("GLP1 agonists") AND Canagliflozin (Invokana),  Dapagliflozin (Farxiga), Empagliflozin (Jardiance), Ertugliflozin (Steglatro), Bexagliflozin [employer]) or any combination with one of these drugs such as Invokamet (Canagliflozin/Metformin), Synjardy (Empagliflozin/Metformin), etc ("SGLT2 inhibitors") must be held around the time of a procedure. This is not a comprehensive list of all of these drugs. Please review all of your medications and talk to your provider if you take any one of these. If you are not sure, ask your provider.   Continue taking your anticoagulant (blood thinner): Apixaban (Eliquis).  You will need to continue this after your procedure until you are told by your provider that it is safe to stop.    LABS: Your labs will be done at the hospital prior to your procedure - you will need to arrive 1 and 1/2 hours prior to your procedure.  FYI:  For your safety, and to allow [place] to monitor your vital signs accurately during the surgery/procedure we request: If you have artificial nails, gel coating, SNS etc, please have those removed prior to your surgery/procedure. Not having the nail coverings /polish removed may result in cancellation or delay of your surgery/procedure.  Your support person will be asked to wait in the waiting room during your procedure.  It is OK to have someone drop you off and come back when you are ready to be discharged.  You cannot drive after the procedure and will need someone to drive you home.  Bring your insurance cards.  *Special Note: Every effort is made to have your procedure done on time. Occasionally there are emergencies that occur at the hospital that may cause delays. Please be patient if a delay  does occur.

## 2023-10-27 ENCOUNTER — Encounter: Payer: Self-pay | Admitting: Internal Medicine

## 2023-10-28 NOTE — Telephone Encounter (Signed)
Stop the toprol XL Come in for EKG  and BP (nurses check)  When is cardioversion?

## 2023-10-28 NOTE — Telephone Encounter (Signed)
Go to 200 mg BID of amiodarone

## 2023-10-29 MED ORDER — AMIODARONE HCL 200 MG PO TABS: 200.0000 mg | ORAL_TABLET | Freq: Two times a day (BID) | ORAL | Status: DC

## 2023-10-29 NOTE — Addendum Note (Signed)
Addended by: Bertram Millard on: 10/29/2023 08:53 AM   Modules accepted: Orders

## 2023-10-29 NOTE — Telephone Encounter (Signed)
I called to check on the pt and she says she is feeling much better today as compared to yesterday... her HR is reading 50 on her [place]... BP 128/65.   She declined an appt for a nurse visit tomorrow since her daughter is coming from [place] and she is a Designer, fashion/clothing MD... she will check her BP and check her pulse manually... especially tomorrow will be the first day she she stops the Toprol and tonight will have a decreased dose of Amio.   She will continue to monitor and let us know if anything worsens.

## 2023-10-29 NOTE — Telephone Encounter (Signed)
Pt advised Dr Tenny Craw' recommendations and has her planned Cardioversion this Friday... she has already taken her meds today but will start the changes tonight... she cannot come in today for ECG so I will call her later to see how she is doing but for now she says she feels well... we will plan for ECG and BP the morning of her Cardioversion but if no improvement and any worsening changes she will change her plans and I will bring her in for ECG and BP as advised by DR Tenny Craw.

## 2023-10-30 NOTE — OR Nursing (Signed)
Called patient with pre-procedure instructions for tomorrow.   Patient informed of:   Time to arrive for procedure. 0815 Remain NPO past midnight.  Must have a ride home and a responsible adult to remain with them for 24 hours post procedure.  Confirmed blood thinner. Confirmed no breaks in taking blood thinner for 3+ weeks prior to procedure. Confirmed patient stopped all GLP-1s and GLP-2s for at least one week before procedure.

## 2023-10-31 ENCOUNTER — Encounter (HOSPITAL_COMMUNITY): Payer: Self-pay | Admitting: Anesthesiology

## 2023-10-31 ENCOUNTER — Encounter (HOSPITAL_COMMUNITY)
Admission: RE | Disposition: A | Discharge status: Home / Self Care | Payer: Self-pay | Source: Home / Self Care | Attending: Cardiology

## 2023-10-31 ENCOUNTER — Other Ambulatory Visit: Payer: Self-pay

## 2023-10-31 ENCOUNTER — Encounter: Payer: Self-pay | Admitting: Internal Medicine

## 2023-10-31 ENCOUNTER — Ambulatory Visit (HOSPITAL_COMMUNITY)
Admission: RE | Admit: 2023-10-31 | Discharge: 2023-10-31 | Disposition: A | Discharge status: Home / Self Care | Payer: PPO | Attending: Cardiology | Admitting: Cardiology

## 2023-10-31 DIAGNOSIS — Z79899 Other long term (current) drug therapy: Secondary | ICD-10-CM | POA: Insufficient documentation

## 2023-10-31 DIAGNOSIS — E785 Hyperlipidemia, unspecified: Secondary | ICD-10-CM | POA: Diagnosis not present

## 2023-10-31 DIAGNOSIS — I4891 Unspecified atrial fibrillation: Secondary | ICD-10-CM

## 2023-10-31 DIAGNOSIS — Z539 Procedure and treatment not carried out, unspecified reason: Secondary | ICD-10-CM | POA: Insufficient documentation

## 2023-10-31 DIAGNOSIS — I480 Paroxysmal atrial fibrillation: Secondary | ICD-10-CM | POA: Insufficient documentation

## 2023-10-31 DIAGNOSIS — I10 Essential (primary) hypertension: Secondary | ICD-10-CM | POA: Insufficient documentation

## 2023-10-31 DIAGNOSIS — I4819 Other persistent atrial fibrillation: Secondary | ICD-10-CM

## 2023-10-31 SURGERY — CARDIOVERSION (CATH LAB)
Anesthesia: Monitor Anesthesia Care

## 2023-10-31 NOTE — Interval H&P Note (Signed)
History and Physical Interval Note:  10/31/2023 9:00 AM  Crystal Odom  has presented today for surgery, with the diagnosis of AFIB.  The various methods of treatment have been discussed with the patient and family. After consideration of risks, benefits and other options for treatment, the patient has consented to  Procedure(s): CARDIOVERSION (N/A) as a surgical intervention.  The patient's history has been reviewed, patient examined, no change in status, stable for surgery.  I have reviewed the patient's chart and labs.  Questions were answered to the patient's satisfaction.     [name]

## 2023-10-31 NOTE — Progress Notes (Signed)
Patient presented today for cardioversion and was found to be in normal sinus rhythm.  Cardioversion canceled.  Will let Dr. Tenny Craw now

## 2023-11-03 DIAGNOSIS — I10 Essential (primary) hypertension: Secondary | ICD-10-CM | POA: Insufficient documentation

## 2023-11-03 DIAGNOSIS — I350 Nonrheumatic aortic (valve) stenosis: Secondary | ICD-10-CM | POA: Insufficient documentation

## 2023-11-03 NOTE — Progress Notes (Signed)
 Cardiology Office Note:    Date:  11/04/2023  ID:  Crystal Odom, DOB 1941-05-18, MRN [id number] PCP: Charlett Apolinar POUR, MD  [facility] Providers Cardiologist:  Vina Gull, MD       Patient Profile:      Paroxysmal atrial fibrillation TTE 09/10/23: EF 60-65, no RWMA, Gr 1 DD, NL RVSF, NL PASP, RVSP 31.7, mild LAE, mild MR, mild AS, mean 10, RAP 3  Admx w AF w RVR in setting of GI illness - s/p DCCV Amiodarone  Rx started 10/2023 Mild aortic stenosis  Hypertension  Hyperlipidemia   AAA US  11/13/16: no AAA Carotid US  11/13/16: Bilat ICA 1-39 CAC score 06/11/16: 0        Crystal Odom is a 83 y.o. female who returns for post hospital follow up. She was admitted in 10/2023 with atrial fibrillation w RVR in the setting of gastroenteritis. She underwent DCCV. Her hsTroponins were elevated c/w demand ischemia. She had recurrent AF w RVR and was seen by Dr. Wendel in clinic on 10/23/23. She was started on Amiodarone  and set up for repeat DCCV. She was in NSR when she presented to the hospital on 10/31/23.   Discussed the use of AI scribe software for clinical note transcription with the patient, who gave verbal consent to proceed.  History of Present Illness   She is here with her husband. She has a [employer] device to monitor her heart rhythm and notes frequent episodes of atrial fibrillation over the past week, with heart rates ranging from 42 to 67 bpm. Despite these fluctuations, she has not experienced rapid atrial fibrillation since starting amiodarone . No chest pain or shortness of breath. She has not had lightheadedness, near syncope or syncope. Her blood pressure has been consistently in the 140s, with occasional readings as high as 156/54. She previously took lisinopril  but discontinued it due to suspected side effects. She is currently not on any blood pressure medication after stopping metoprolol .     Review of Systems  Gastrointestinal:  Negative for  hematochezia and melena.  Genitourinary:  Negative for hematuria.  -See HPI    Studies Reviewed:   EKG Interpretation Date/Time:  Tuesday November 04 2023 14:06:37 EST Ventricular Rate:  60 PR Interval:    QRS Duration:  92 QT Interval:  454 QTC Calculation: 454 R Axis:   -3  Text Interpretation: Normal sinus rhythm Low voltage QRS Nonspecific ST and T wave abnormality No significant change since last tracing dated 10/31/23 Confirmed by Lelon Hamilton [phone]) on 11/04/2023 2:09:35 PM           Risk Assessment/Calculations:    CHA2DS2-VASc Score = 3   This indicates a 3.2% annual risk of stroke. The patient's score is based upon: CHF History: 0 HTN History: 0 Diabetes History: 0 Stroke History: 0 Vascular Disease History: 0 Age Score: 2 Gender Score: 1           Physical Exam:   VS:  BP (!) 150/78   Pulse 70   Ht 5' 4 (1.626 m)   Wt 122 lb 12.8 oz (55.7 kg)   SpO2 97%   BMI 21.08 kg/m    Wt Readings from Last 3 Encounters:  11/04/23 122 lb 12.8 oz (55.7 kg)  10/23/23 121 lb 3.2 oz (55 kg)  10/10/23 121 lb 12.8 oz (55.2 kg)    Constitutional:      Appearance: Healthy appearance. Not in distress.  Neck:     Vascular: JVD normal.  Pulmonary:     Breath sounds: Normal breath sounds. No wheezing. No rales.  Cardiovascular:     Normal rate. Regular rhythm.     Murmurs: There is a grade 1/6 systolic murmur at the URSB.  Edema:    Peripheral edema absent.  Abdominal:     Palpations: Abdomen is soft.      Assessment and Plan:   Assessment & Plan Paroxysmal atrial fibrillation [facility]) She was admitted in early Jan 2025 with AF w RVR in the setting of Norovirus and underwent DCCV. She had a mild elevation in Troponin that was flat and c/w demand ischemia. She then had recurrent AF w RVR and was placed on Amiodarone  with plans to repeat DCCV. However, she went back into NSR and the DCCV was canceled. She has had possible AFib recorded on her [facility] device. I  reviewed those strips today from her phone. Some of the recordings show a HR in the 40s. There is a lot of artifact and it is difficult to know if this is slow AFib or junctional rhythm with ventricular escape beats or possibly Mobitz 1. The rhythm is slow and irregular but there often appears to be p waves. Her daughter is a family physician and the pt shared with me a note from her daughter. This raised the concern of long term Amiodarone  and she would like to avoid this. Since she has structural heart disease with aortic stenosis, I do not think she is a candidate for Class Ic drugs. She could take Dofetilide but would need adequate time for Amiodarone  washout. She seems like a reasonable candidate for PVI ablation and I think it would be reasonable to refer her to EP to get their opinion. Of note, based on her age and weight, she should be on Eliquis  2.5 mg twice daily. We did not change her in the hospital b/c she had just had DCCV. She was essentially chemically cardioverted recently with Amiodarone . I would like to hold off on reducing her dose until seen in follow up to get her 4 weeks out from starting Amiodarone .  -Decrease Amiodarone  to 200 mg once daily  -Continue Eliquis  5 mg twice daily for now. Will eventually need to reduce this to 2.5 mg twice daily based on age, weight. -Zio XT x 14 days to better characterize her rhythm, assess AFib burden -Refer to EP to consider ablation Nonrheumatic aortic valve stenosis Mild by Echocardiogram in 08/2023. Plan repeat TTE in 2-3 years. Essential hypertension BP uncontrolled. She was on metoprolol . Based upon her HR, I would not restart this.  -Start Lisinopril  5 mg once daily -BMET 4 weeks       Dispo:  Refer to EP. Return in about 6 months (around 05/03/2024) for Routine Follow Up, w/ Dr. Okey.  Signed, Glendia Ferrier, PA-C

## 2023-11-04 ENCOUNTER — Encounter: Payer: Self-pay | Admitting: Physician Assistant

## 2023-11-04 ENCOUNTER — Ambulatory Visit: Payer: PPO | Attending: Physician Assistant | Admitting: Physician Assistant

## 2023-11-04 ENCOUNTER — Ambulatory Visit (INDEPENDENT_AMBULATORY_CARE_PROVIDER_SITE_OTHER): Payer: PPO

## 2023-11-04 ENCOUNTER — Other Ambulatory Visit: Payer: Self-pay | Admitting: *Deleted

## 2023-11-04 VITALS — BP 150/78 | HR 70 | Ht 64.0 in | Wt 122.8 lb

## 2023-11-04 DIAGNOSIS — I350 Nonrheumatic aortic (valve) stenosis: Secondary | ICD-10-CM | POA: Diagnosis not present

## 2023-11-04 DIAGNOSIS — I480 Paroxysmal atrial fibrillation: Secondary | ICD-10-CM | POA: Diagnosis not present

## 2023-11-04 DIAGNOSIS — I10 Essential (primary) hypertension: Secondary | ICD-10-CM

## 2023-11-04 MED ORDER — LISINOPRIL 5 MG PO TABS: 5.0000 mg | ORAL_TABLET | Freq: Every day | ORAL | 3 refills | Status: AC

## 2023-11-04 MED ORDER — AMIODARONE HCL 200 MG PO TABS: 200.0000 mg | ORAL_TABLET | Freq: Every day | ORAL | 3 refills | Status: DC

## 2023-11-04 MED ORDER — LISINOPRIL 5 MG PO TABS: 5.0000 mg | ORAL_TABLET | Freq: Every day | ORAL | 3 refills | Status: DC

## 2023-11-04 NOTE — Telephone Encounter (Signed)
Please confirm amiodarone dose that she is currently taking

## 2023-11-04 NOTE — Progress Notes (Unsigned)
Enrolled for [employer] to mail a ZIO XT long term holter monitor to the patients address on file.   Dr. Ross to read. 

## 2023-11-04 NOTE — Assessment & Plan Note (Addendum)
 She was admitted in early Jan 2025 with AF w RVR in the setting of Norovirus and underwent DCCV. She had a mild elevation in Troponin that was flat and c/w demand ischemia. She then had recurrent AF w RVR and was placed on Amiodarone  with plans to repeat DCCV. However, she went back into NSR and the DCCV was canceled. She has had possible AFib recorded on her [facility] device. I reviewed those strips today from her phone. Some of the recordings show a HR in the 40s. There is a lot of artifact and it is difficult to know if this is slow AFib or junctional rhythm with ventricular escape beats or possibly Mobitz 1. The rhythm is slow and irregular but there often appears to be p waves. Her daughter is a family physician and the pt shared with me a note from her daughter. This raised the concern of long term Amiodarone  and she would like to avoid this. Since she has structural heart disease with aortic stenosis, I do not think she is a candidate for Class Ic drugs. She could take Dofetilide but would need adequate time for Amiodarone  washout. She seems like a reasonable candidate for PVI ablation and I think it would be reasonable to refer her to EP to get their opinion. Of note, based on her age and weight, she should be on Eliquis  2.5 mg twice daily. We did not change her in the hospital b/c she had just had DCCV. She was essentially chemically cardioverted recently with Amiodarone . I would like to hold off on reducing her dose until seen in follow up to get her 4 weeks out from starting Amiodarone .  -Decrease Amiodarone  to 200 mg once daily  -Continue Eliquis  5 mg twice daily for now. Will eventually need to reduce this to 2.5 mg twice daily based on age, weight. -Zio XT x 14 days to better characterize her rhythm, assess AFib burden -Refer to EP to consider ablation

## 2023-11-04 NOTE — Assessment & Plan Note (Signed)
BP uncontrolled. She was on metoprolol. Based upon her HR, I would not restart this.  -Start Lisinopril 5 mg once daily -BMET 4 weeks

## 2023-11-04 NOTE — Patient Instructions (Addendum)
 Medication Instructions:  Your physician has recommended you make the following change in your medication:   REDUCE Amiodarone  to 200 mg taking only 1 daily  START Lisinopril  5 mg taking 1 daily  *If you need a refill on your cardiac medications before your next appointment, please call your pharmacy*   Lab Work: 4 WEEKS GO TO A [facility] NEAR YOU FOR:  BMET  If you have labs (blood work) drawn today and your tests are completely normal, you will receive your results only by: MyChart Message (if you have MyChart) OR A paper copy in the mail If you have any lab test that is abnormal or we need to change your treatment, we will call you to review the results.   Testing/Procedures: Crystal HEWS- Long Term Monitor Instructions  Your physician has requested you wear a ZIO patch monitor for 14 days.  This is a single patch monitor. [employer] supplies one patch monitor per enrollment. Additional stickers are not available. Please do not apply patch if you will be having a Nuclear Stress Test,  Echocardiogram, Cardiac CT, MRI, or Chest Xray during the period you would be wearing the  monitor. The patch cannot be worn during these tests. You cannot remove and re-apply the  ZIO XT patch monitor.  Your ZIO patch monitor will be mailed 3 day USPS to your address on file. It may take 3-5 days  to receive your monitor after you have been enrolled.  Once you have received your monitor, please review the enclosed instructions. Your monitor  has already been registered assigning a specific monitor serial # to you.  Billing and Patient Assistance Program Information  We have supplied [employer] with any of your insurance information on file for billing purposes. [employer] offers a sliding scale Patient Assistance Program for patients that do not have  insurance, or whose insurance does not completely cover the cost of the ZIO monitor.  You must apply for the Patient Assistance Program to qualify for this  discounted rate.  To apply, please call [employer] at [phone], select option 4, select option 2, ask to apply for  Patient Assistance Program. Meredeth will ask your household income, and how many people  are in your household. They will quote your out-of-pocket cost based on that information.  [employer] will also be able to set up a 4-month, interest-free payment plan if needed.  Applying the monitor   Shave hair from upper left chest.  Hold abrader disc by orange tab. Rub abrader in 40 strokes over the upper left chest as  indicated in your monitor instructions.  Clean area with 4 enclosed alcohol pads. Let dry.  Apply patch as indicated in monitor instructions. Patch will be placed under collarbone on left  side of chest with arrow pointing upward.  Rub patch adhesive wings for 2 minutes. Remove white label marked 1. Remove the white  label marked 2. Rub patch adhesive wings for 2 additional minutes.  While looking in a mirror, press and release button in center of patch. A small green light will  flash 3-4 times. This will be your only indicator that the monitor has been turned on.  Do not shower for the first 24 hours. You may shower after the first 24 hours.  Press the button if you feel a symptom. You will hear a small click. Record Date, Time and  Symptom in the Patient Logbook.  When you are ready to remove the patch, follow instructions on the last 2 pages  of Patient  Logbook. Stick patch monitor onto the last page of Patient Logbook.  Place Patient Logbook in the blue and white box. Use locking tab on box and tape box closed  securely. The blue and white box has prepaid postage on it. Please place it in the mailbox as  soon as possible. Your physician should have your test results approximately 7 days after the  monitor has been mailed back to [facility].  Call [facility] Customer Care at [phone] if you have questions regarding  your ZIO XT patch monitor. Call  them immediately if you see an orange light blinking on your  monitor.  If your monitor falls off in less than 4 days, contact our Monitor department at [phone].  If your monitor becomes loose or falls off after 4 days call [employer] at [phone] for  suggestions on securing your monitor   You have been referred to  SEE ELECTROPHYSIOLOGY FOR A-FIB Follow-Up: At [facility], you and your health needs are our priority.  As part of our continuing mission to provide you with exceptional heart care, we have created designated Provider Care Teams.  These Care Teams include your primary Cardiologist (physician) and Advanced Practice Providers (APPs -  Physician Assistants and Nurse Practitioners) who all work together to provide you with the care you need, when you need it.  We recommend signing up for the patient portal called MyChart.  Sign up information is provided on this After Visit Summary.  MyChart is used to connect with patients for Virtual Visits (Telemedicine).  Patients are able to view lab/test results, encounter notes, upcoming appointments, etc.  Non-urgent messages can be sent to your provider as well.   To learn more about what you can do with MyChart, go to [email or web address].    Your next appointment:   6 month(s)  Provider:   Vina Gull, MD  or Glendia Ferrier, PA-C         Other Instructions   1st Floor: - Lobby - Registration  - Pharmacy  - Lab - Cafe  2nd Floor: - PV Lab - Diagnostic Testing (echo, CT, nuclear med)  3rd Floor: - Vacant  4th Floor: - TCTS (cardiothoracic surgery) - AFib Clinic - Structural Heart Clinic - Vascular Surgery  - Vascular Ultrasound  5th Floor: - HeartCare Cardiology (general and EP) - Clinical Pharmacy for coumadin, hypertension, lipid, weight-loss medications, and med management appointments    Valet parking services will be available as well.

## 2023-11-04 NOTE — Assessment & Plan Note (Signed)
Mild by Echocardiogram in 08/2023. Plan repeat TTE in 2-3 years.

## 2023-11-05 NOTE — Progress Notes (Signed)
 Chief Complaint  Patient presents with   Annual Exam    HPI: Patient  Crystal Odom  83 y.o. comes in today for Preventive Health Care visit  And med updated evaluation  Hx of PAF and had  triggered norovirus  tiggered .  And CV  also  after severe case of norovirus. Thyroid  autoimmune  Non rheum AS  HTBP  off  metoprolol   and jsut started lisinopril  5 mg  Close fu with Cards  soon Health Maintenance  Topic Date Due   Medicare Annual Wellness (AWV)  11/02/2023   COVID-19 Vaccine (9 - 2024-25 season) 11/22/2023 (Originally 07/25/2023)   DTaP/Tdap/Td (3 - Td or Tdap) 07/05/2024 (Originally 02/03/2023)   Pneumonia Vaccine 12+ Years old  Completed   INFLUENZA VACCINE  Completed   DEXA SCAN  Completed   Zoster Vaccines- Shingrix  Completed   HPV VACCINES  Aged Out   Colonoscopy  Discontinued   Hepatitis C Screening  Discontinued   Health Maintenance Review LIFESTYLE:  Exercise:  [place] it [place] for now Tobacco/ETS:n Alcohol: ocass Sugar beverages:n Sleep:ok Drug use: no HH of 2    ROS:  GEN/ HEENT: No fever, significant weight changes sweats headaches vision problems hearing changes, CV/ PULM; No chest pain shortness of breath cough, syncope,edema  change in exercise tolerance. GI /GU: No adominal pain, vomiting, change in bowel habits. No blood in the stool. No significant GU symptoms. SKIN/HEME: ,no acute skin rashes suspicious lesions or bleeding. No lymphadenopathy, nodules, masses.  NEURO/ PSYCH:  No neurologic signs such as weakness numbness. No depression anxiety. IMM/ Allergy: No unusual infections.  Allergy .   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 and neutropenia   Arthritis    left knee   Diplopia 09/30/2000   eval neg   Hyperlipidemia    HDL over 100   Hypothyroidism    Kidney stones    in past    Leukopenia    with neg hemevaluation bm bx get wbc diff q 6 months   UTI (lower urinary tract infection)     Past Surgical History:  Procedure Laterality Date   BONE MARROW BIOPSY     low WBC   CATARACT EXTRACTION, BILATERAL     kidney stone removal     left arthroscopic  2005   left knee   LIVER BIOPSY     reported normal   TONSILLECTOMY     TUBAL LIGATION      Family History  Problem Relation Age of Onset   Other Father        major renal failure; SCCA in ear   Osteoarthritis Father    Heart disease Father    Heart disease Mother        71   Autoimmune disease Sister    Autoimmune disease Brother    Stroke Maternal Grandmother    Thyroid  disease Other    Stroke Other    Osteoporosis Other     Social History   Socioeconomic History   Marital status: Married    Spouse name: Not on file   Number of children: Not on file   Years of education: Not on file   Highest education level: Bachelor's degree (e.g., BA, AB, BS)  Occupational History   Not on file  Tobacco Use  Smoking status: Never   Smokeless tobacco: Never  Vaping Use   Vaping status: Never Used  Substance and Sexual Activity   Alcohol use: Yes    Alcohol/week: 2.0 - 3.0 standard drinks of alcohol    Types: 2 - 3 Glasses of wine per week    Comment: 2 to 3 glasses a week    Drug use: No   Sexual activity: Yes  Other Topics Concern   Not on file  Social History Narrative   Married   Retired  2008   Regular exercise-yes   HH of 2   No pets   Social etoh   G2P2      Husband hac CABG 2012   Travel grandchildren    Social Drivers of Corporate Investment Banker Strain: Low Risk  (11/05/2023)   Overall Financial Resource Strain (CARDIA)    Difficulty of Paying Living Expenses: Not hard at all  Food Insecurity: No Food Insecurity (11/05/2023)   Hunger Vital Sign    Worried About Running Out of Food in the Last Year: Never true    Ran Out of Food in the Last Year: Never true  Transportation  Needs: No Transportation Needs (11/05/2023)   PRAPARE - Administrator, Civil Service (Medical): No    Lack of Transportation (Non-Medical): No  Physical Activity: Sufficiently Active (11/05/2023)   Exercise Vital Sign    Days of Exercise per Week: 5 days    Minutes of Exercise per Session: 30 min  Stress: No Stress Concern Present (11/05/2023)   [employer] of Occupational Health - Occupational Stress Questionnaire    Feeling of Stress : Not at all  Social Connections: Socially Integrated (11/05/2023)   Social Connection and Isolation Panel [NHANES]    Frequency of Communication with Friends and Family: More than three times a week    Frequency of Social Gatherings with Friends and Family: Once a week    Attends Religious Services: More than 4 times per year    Active Member of [employer] or Organizations: Yes    Attends Engineer, Structural: More than 4 times per year    Marital Status: Married    Outpatient Medications Prior to Visit  Medication Sig Dispense Refill   acetaminophen  (TYLENOL ) 500 MG tablet Take 1,000 mg by mouth every 6 (six) hours as needed for moderate pain (pain score 4-6) or mild pain (pain score 1-3).     amiodarone  (PACERONE ) 200 MG tablet Take 1 tablet (200 mg total) by mouth daily. 90 tablet 3   apixaban  (ELIQUIS ) 5 MG TABS tablet Take 1 tablet (5 mg total) by mouth 2 (two) times daily. 180 tablet 3   cholecalciferol (VITAMIN D3) 25 MCG (1000 UNIT) tablet Take 1,000 Units by mouth daily.     ezetimibe  (ZETIA ) 10 MG tablet Take 1 tablet (10 mg total) by mouth at bedtime.     levothyroxine  (SYNTHROID ) 88 MCG tablet Take 1 tablet (88 mcg total) by mouth in the morning.     lisinopril  (ZESTRIL ) 5 MG tablet Take 1 tablet (5 mg total) by mouth daily. 90 tablet 3   rosuvastatin  (CRESTOR ) 10 MG tablet Take 1 tablet (10 mg total) by mouth at bedtime.     No facility-administered medications prior to visit.     EXAM:  BP 126/84 (BP Location: Right  Arm, Patient Position: Sitting, Cuff Size: Normal)   Pulse (!) 57   Temp 97.7 F (36.5 C) (Oral)   Ht 5'  3.9 (1.623 m)   Wt 121 lb (54.9 kg)   SpO2 98%   BMI 20.83 kg/m   Body mass index is 20.83 kg/m. Wt Readings from Last 3 Encounters:  11/06/23 121 lb (54.9 kg)  11/04/23 122 lb 12.8 oz (55.7 kg)  10/23/23 121 lb 3.2 oz (55 kg)    Physical Exam: Vital signs reviewed HZW:Uypd is a well-developed well-nourished alert cooperative    who appearsr stated age in no acute distress.  HEENT: normocephalic atraumatic , Eyes: PERRL EOM's full, conjunctiva clear, Nares: paten,t no deformity discharge or tenderness., Ears: no deformity EAC's clear TMs with normal landmarks. Mouth: clear OP, no lesions, edema.  Moist mucous membranes. Dentition in adequate repair. NECK: supple without masses, thyromegaly or bruits. CHEST/PULM:  Clear to auscultation and percussion breath sounds equal no wheeze , rales or rhonchi. No chest wall deformities or tenderness. Breast: normal by inspection . No dimpling, discharge, masses, tenderness or discharge . CV: PMI is nondisplaced, S1 S2 no gallops, murmurs, rubs. Peripheral pulses are prsent without delay.No JVD .  ABDOMEN: Bowel sounds normal nontender  No guard or rebound, no hepato splenomegal no CVA tenderness.   Extremtities:  No clubbing cyanosis or edema, no acute joint swelling or redness no focal atrophy NEURO:  Oriented x3, cranial nerves 3-12 appear to be intact, no obvious focal weakness,gait within normal limits no abnormal reflexes or asymmetrical SKIN: No acute rashes normal turgor, color, no bruising or petechiae. PSYCH: Oriented, good eye contact, no obvious depression anxiety, cognition and judgment appear normal. LN: no cervical axillary  adenopathy  Lab Results  Component Value Date   WBC 3.8 (L) 11/06/2023   HGB 14.3 11/06/2023   HCT 43.1 11/06/2023   PLT 277.0 11/06/2023   GLUCOSE 95 11/06/2023   CHOL 134 10/11/2023   TRIG 117  10/11/2023   HDL 57 10/11/2023   LDLDIRECT 149.6 02/02/2013   LDLCALC 54 10/11/2023   ALT 37 (H) 11/06/2023   AST 29 11/06/2023   NA 139 11/06/2023   K 3.9 11/06/2023   CL 103 11/06/2023   CREATININE 0.73 11/06/2023   BUN 18 11/06/2023   CO2 28 11/06/2023   TSH 2.99 11/06/2023   INR 0.91 11/10/2009   HGBA1C 5.8 (H) 08/21/2023    BP Readings from Last 3 Encounters:  11/06/23 126/84  11/04/23 (!) 150/78  10/31/23 (!) 157/79    Lab plan update  reviewed with patient   ASSESSMENT AND PLAN:  Discussed the following assessment and plan:    ICD-10-CM   1. Visit for preventive health examination  Z00.00     2. Medication management  Z79.899 TSH    Basic Metabolic Panel    CBC with Differential/Platelet    T4, free    Hepatic function panel    3. Hypothyroidism, unspecified type  E03.9 TSH    Basic Metabolic Panel    CBC with Differential/Platelet    T4, free    Hepatic function panel    4. Anticoagulant long-term use  Z79.01 TSH    Basic Metabolic Panel    CBC with Differential/Platelet    T4, free    Hepatic function panel    5. Intermittent atrial fibrillation (HCC)  I48.0 TSH    Basic Metabolic Panel    CBC with Differential/Platelet    T4, free    Hepatic function panel    6. Abnormal LFTs  R79.89 TSH    Basic Metabolic Panel    CBC with Differential/Platelet  T4, free    Hepatic function panel    7. Estrogen deficiency  E28.39 DG Bone Density    Update dexa plan Update labs  Cards fu  Fu depending or year as cards to fu cv related at this time Return in about 1 year (around 11/05/2024) for depending on results.  Patient Care Team: Julion Gatt, Apolinar POUR, MD as PCP - General (Internal Medicine) Okey Vina GAILS, MD as PCP - Cardiology (Cardiology) Jane Charleston, MD (Orthopedic Surgery) Lenon Faden, MD (Internal Medicine) Pyrtle, Gordy HERO, MD as Consulting Physician (Gastroenterology) Pyrtle, Gordy HERO, MD as Consulting Physician  (Gastroenterology) Cleotilde Sewer, OD (Optometry) Hobart Powell BRAVO, MD (Inactive) as Consulting Physician (Cardiology) Liane Sharyne MATSU, [facility] (Inactive) as Pharmacist (Pharmacist) Patient Instructions  Good to see  you today . Rechecking thyroid  and chem and cbc.  Will order DEXA scan   get appt for this and continue vit D   Plan fu depending results   Javell Blackburn K. Jinx Gilden M.D.

## 2023-11-06 ENCOUNTER — Encounter: Payer: Self-pay | Admitting: Internal Medicine

## 2023-11-06 ENCOUNTER — Ambulatory Visit (INDEPENDENT_AMBULATORY_CARE_PROVIDER_SITE_OTHER): Payer: PPO | Admitting: Internal Medicine

## 2023-11-06 VITALS — BP 126/84 | HR 57 | Temp 97.7°F | Ht 63.9 in | Wt 121.0 lb

## 2023-11-06 DIAGNOSIS — E2839 Other primary ovarian failure: Secondary | ICD-10-CM

## 2023-11-06 DIAGNOSIS — Z0000 Encounter for general adult medical examination without abnormal findings: Secondary | ICD-10-CM | POA: Diagnosis not present

## 2023-11-06 DIAGNOSIS — Z79899 Other long term (current) drug therapy: Secondary | ICD-10-CM

## 2023-11-06 DIAGNOSIS — I480 Paroxysmal atrial fibrillation: Secondary | ICD-10-CM | POA: Diagnosis not present

## 2023-11-06 DIAGNOSIS — E039 Hypothyroidism, unspecified: Secondary | ICD-10-CM

## 2023-11-06 DIAGNOSIS — Z7901 Long term (current) use of anticoagulants: Secondary | ICD-10-CM | POA: Diagnosis not present

## 2023-11-06 DIAGNOSIS — R7989 Other specified abnormal findings of blood chemistry: Secondary | ICD-10-CM | POA: Diagnosis not present

## 2023-11-06 LAB — HEPATIC FUNCTION PANEL
ALT: 37 U/L — ABNORMAL HIGH (ref 0–35)
AST: 29 U/L (ref 0–37)
Albumin: 4.1 g/dL (ref 3.5–5.2)
Alkaline Phosphatase: 58 U/L (ref 39–117)
Bilirubin, Direct: 0.1 mg/dL (ref 0.0–0.3)
Total Bilirubin: 0.7 mg/dL (ref 0.2–1.2)
Total Protein: 7 g/dL (ref 6.0–8.3)

## 2023-11-06 LAB — CBC WITH DIFFERENTIAL/PLATELET
Basophils Absolute: 0 10*3/uL (ref 0.0–0.1)
Basophils Relative: 0.6 % (ref 0.0–3.0)
Eosinophils Absolute: 0.1 10*3/uL (ref 0.0–0.7)
Eosinophils Relative: 2 % (ref 0.0–5.0)
HCT: 43.1 % (ref 36.0–46.0)
Hemoglobin: 14.3 g/dL (ref 12.0–15.0)
Lymphocytes Relative: 23.8 % (ref 12.0–46.0)
Lymphs Abs: 0.9 10*3/uL (ref 0.7–4.0)
MCHC: 33.3 g/dL (ref 30.0–36.0)
MCV: 99.3 fL (ref 78.0–100.0)
Monocytes Absolute: 0.4 10*3/uL (ref 0.1–1.0)
Monocytes Relative: 11.4 % (ref 3.0–12.0)
Neutro Abs: 2.3 10*3/uL (ref 1.4–7.7)
Neutrophils Relative %: 62.2 % (ref 43.0–77.0)
Platelets: 277 10*3/uL (ref 150.0–400.0)
RBC: 4.34 Mil/uL (ref 3.87–5.11)
RDW: 14.6 % (ref 11.5–15.5)
WBC: 3.8 10*3/uL — ABNORMAL LOW (ref 4.0–10.5)

## 2023-11-06 LAB — BASIC METABOLIC PANEL
BUN: 18 mg/dL (ref 6–23)
CO2: 28 meq/L (ref 19–32)
Calcium: 9.2 mg/dL (ref 8.4–10.5)
Chloride: 103 meq/L (ref 96–112)
Creatinine, Ser: 0.73 mg/dL (ref 0.40–1.20)
GFR: 77.67 mL/min (ref >60.00)
Glucose, Bld: 95 mg/dL (ref 70–99)
Potassium: 3.9 meq/L (ref 3.5–5.1)
Sodium: 139 meq/L (ref 135–145)

## 2023-11-06 LAB — TSH: TSH: 2.99 u[IU]/mL (ref 0.35–5.50)

## 2023-11-06 LAB — T4, FREE: Free T4: 1.74 ng/dL — ABNORMAL HIGH (ref 0.60–1.60)

## 2023-11-06 NOTE — Patient Instructions (Signed)
 Good to see  you today . Rechecking thyroid  and chem and cbc.  Will order DEXA scan   get appt for this and continue vit D   Plan fu depending results

## 2023-11-09 ENCOUNTER — Encounter: Payer: Self-pay | Admitting: Internal Medicine

## 2023-11-09 NOTE — Progress Notes (Signed)
 Results stable  the elevated t4 may be related to amiodarone    tsh is normal  and favorable range  Should be followed . And have cardiology involved :sending  result to scott weaver  the last cards team member you saw. Suggest fu tsh free t4 in a month or as per cards

## 2023-11-10 ENCOUNTER — Other Ambulatory Visit: Payer: Self-pay | Admitting: *Deleted

## 2023-11-10 ENCOUNTER — Encounter: Payer: Self-pay | Admitting: Internal Medicine

## 2023-11-10 ENCOUNTER — Telehealth: Payer: Self-pay | Admitting: *Deleted

## 2023-11-10 DIAGNOSIS — Z79899 Other long term (current) drug therapy: Secondary | ICD-10-CM

## 2023-11-10 NOTE — Telephone Encounter (Signed)
-----   Message from [place] sent at 11/10/2023  8:13 AM EST ----- Amiodarone  decreased at last visit. Please repeat TSH, Free T4 in 3-4 weeks. [name], PA-C    11/10/2023 8:13 AM

## 2023-11-14 NOTE — Telephone Encounter (Signed)
Orders were placed by pt's cardiology team. Pt is aware. Please other encounter.

## 2023-11-19 ENCOUNTER — Encounter: Payer: Self-pay | Admitting: Internal Medicine

## 2023-11-26 NOTE — Telephone Encounter (Signed)
 Please call pt and ask: Is she short of breath? Has she gained weight? If so how much? Is it hard to breath if she is flat on her back? Is she dizzy, feels like she will pass out or has she passed out? Is she experiencing any bleeding (black stools, blood stools, bloody urine, etc)? Tereso Newcomer, PA-C    11/26/2023 5:10 PM

## 2023-11-27 NOTE — Telephone Encounter (Signed)
 Spoke with pt and reviewed symptom list below.  Pt states she is having NO symptoms at this time and that she actually feels "pretty good."  She will be flying back to [facility] this evening and has compression stockings to wear on the plane.  She states she is awaiting monitor report and will plan to follow up as scheduled unless symptoms change or she hears differently from the provider.  Pt tahnked Charity fundraiser for the call.

## 2023-12-01 ENCOUNTER — Ambulatory Visit (INDEPENDENT_AMBULATORY_CARE_PROVIDER_SITE_OTHER)
Admission: RE | Admit: 2023-12-01 | Discharge: 2023-12-01 | Disposition: A | Discharge status: Home / Self Care | Payer: PPO | Source: Ambulatory Visit | Attending: Internal Medicine | Admitting: Internal Medicine

## 2023-12-01 DIAGNOSIS — E2839 Other primary ovarian failure: Secondary | ICD-10-CM

## 2023-12-02 DIAGNOSIS — I350 Nonrheumatic aortic (valve) stenosis: Secondary | ICD-10-CM | POA: Diagnosis not present

## 2023-12-02 DIAGNOSIS — I480 Paroxysmal atrial fibrillation: Secondary | ICD-10-CM | POA: Diagnosis not present

## 2023-12-02 DIAGNOSIS — I10 Essential (primary) hypertension: Secondary | ICD-10-CM | POA: Diagnosis not present

## 2023-12-02 DIAGNOSIS — Z79899 Other long term (current) drug therapy: Secondary | ICD-10-CM | POA: Diagnosis not present

## 2023-12-03 ENCOUNTER — Other Ambulatory Visit: Payer: Self-pay | Admitting: *Deleted

## 2023-12-03 ENCOUNTER — Telehealth: Payer: Self-pay | Admitting: *Deleted

## 2023-12-03 ENCOUNTER — Telehealth: Payer: Self-pay | Admitting: Physician Assistant

## 2023-12-03 DIAGNOSIS — Z79899 Other long term (current) drug therapy: Secondary | ICD-10-CM

## 2023-12-03 LAB — BASIC METABOLIC PANEL
BUN/Creatinine Ratio: 18 (ref 12–28)
BUN: 14 mg/dL (ref 8–27)
CO2: 23 mmol/L (ref 20–29)
Calcium: 9.3 mg/dL (ref 8.7–10.3)
Chloride: 107 mmol/L — ABNORMAL HIGH (ref 96–106)
Creatinine, Ser: 0.78 mg/dL (ref 0.57–1.00)
Glucose: 82 mg/dL (ref 70–99)
Potassium: 4.3 mmol/L (ref 3.5–5.2)
Sodium: 144 mmol/L (ref 134–144)
eGFR: 77 mL/min/{1.73_m2} (ref >59)

## 2023-12-03 LAB — TSH: TSH: 3.85 u[IU]/mL (ref 0.450–4.500)

## 2023-12-03 LAB — T4, FREE: Free T4: 2.36 ng/dL — ABNORMAL HIGH (ref 0.82–1.77)

## 2023-12-03 NOTE — Telephone Encounter (Signed)
 Follow Up:     Crystal Odom from [facility] is calling with abnormal results.

## 2023-12-03 NOTE — Telephone Encounter (Signed)
 Received final monitor report for this pt. The provider, Tereso Newcomer, PA was notified.

## 2023-12-03 NOTE — Telephone Encounter (Signed)
-----   Message from Tereso Newcomer sent at 12/03/2023  2:08 PM EST ----- Results sent to Georgiann Cocker [place] via MyChart. See MyChart comments below. PLAN:  -Repeat TSH, Free T4 in 4 weeks (Dx: hyperthyroidism)   Ms. Reinheimer  Your thyroid tests are mildly abnormal and likely reflect amiodarone therapy. The abnormalities do not mean that we need to stop or change amiodarone at this time. We will need to continue to monitor the thyroid tests. You have the appointment with Dr. Lalla Brothers soon. He will help [place] determine the best management for atrial fibrillation, including whether or not to continue the amiodarone.  Tereso Newcomer, PA-C

## 2023-12-05 ENCOUNTER — Telehealth: Payer: Self-pay | Admitting: *Deleted

## 2023-12-05 MED ORDER — AMIODARONE HCL 200 MG PO TABS: 100.0000 mg | ORAL_TABLET | Freq: Every day | ORAL | Status: DC

## 2023-12-05 NOTE — Telephone Encounter (Signed)
 Pt has been made aware of report of her monitor.  She will reduce the Amiodarone to 200 mg taking 1/2 tablet daily.  She has been made aware if she experiences near syncope to call us, if she experiences syncope, of course, go to the ED.  Pt aware to keep her appt with Dr. Lalla Brothers.  She was very thankful for the call.

## 2023-12-05 NOTE — Telephone Encounter (Signed)
-----   Message from Tereso Newcomer sent at 12/05/2023 12:55 PM EST ----- Regarding: RE: Monitor resutls Monitor to be read by Dr. Tenny Craw. Results still pending. I reviewed report. She had low burden of AFib (2%). She did have junctional rhythm, bradycardia and 1 four second pause. She sees EP in a couple of weeks. Will try to keep her on Amio since it is keeping her in NSR the majority of the time.  PLAN: -Let's have her decrease Amiodarone to 100 mg once daily until she sees EP. -If she has near syncope call (if she has syncope go to the ED). Tereso Newcomer, PA-C    12/05/2023 12:57 PM ----- Message ----- From: Erick Alley, RN Sent: 12/03/2023  12:13 PM EST To: Beatrice Lecher, PA-C Subject: Monitor resutls                                Final monitor result is posted. Please review. Thank you!

## 2023-12-18 ENCOUNTER — Ambulatory Visit: Payer: PPO | Attending: Cardiology | Admitting: Cardiology

## 2023-12-18 ENCOUNTER — Encounter: Payer: Self-pay | Admitting: Cardiology

## 2023-12-18 VITALS — BP 170/102 | HR 68 | Ht 63.9 in | Wt 126.6 lb

## 2023-12-18 DIAGNOSIS — I4819 Other persistent atrial fibrillation: Secondary | ICD-10-CM | POA: Diagnosis not present

## 2023-12-18 DIAGNOSIS — I10 Essential (primary) hypertension: Secondary | ICD-10-CM

## 2023-12-18 DIAGNOSIS — Z79899 Other long term (current) drug therapy: Secondary | ICD-10-CM | POA: Diagnosis not present

## 2023-12-18 DIAGNOSIS — M25562 Pain in left knee: Secondary | ICD-10-CM | POA: Diagnosis not present

## 2023-12-18 NOTE — Progress Notes (Signed)
 Electrophysiology Office Note:    Date:  12/18/2023   ID:  Crystal Odom, Crystal Odom 10/22/1940, MRN [id number]  [facility] Cardiologist:  Dietrich Pates, MD  [facility] Electrophysiologist:  Lanier Prude, MD   Referring MD: Beatrice Lecher, PA-C   Chief Complaint: Atrial fibrillation  History of Present Illness:    Crystal Odom is an 83 year old woman who I am seeing today for an evaluation of paroxysmal atrial fibrillation at the request of Tereso Newcomer, PA-C.  The patient has a history of mild aortic stenosis, hypertension, hyperlipidemia, AAA.  The patient is with her husband during today's clinic appointment.  She reports symptomatic atrial fibrillation.  Her daughter is a family physician.  After she experienced recurrence of atrial fibrillation following a cardioversion she was started on amiodarone.  She now takes 100 mg of amiodarone by mouth once daily.  She also takes Eliquis for stroke prophylaxis.  No bleeding issues on Eliquis.  She is interested in avoiding long-term exposure to amiodarone which I think is very reasonable.  She is interested in pursuing catheter ablation.     Their past medical, social and family history was reviewed.   ROS:   Please see the history of present illness.    All other systems reviewed and are negative.  EKGs/Labs/Other Studies Reviewed:    The following studies were reviewed today:  September 10, 2023 echo EF 60-65 RV normal Dilated left atrium Mild MR Mild AS       Physical Exam:    VS:  BP (!) 170/102 (BP Location: Left Arm, Patient Position: Sitting, Cuff Size: Normal)   Pulse 68   Ht 5' 3.9" (1.623 m)   Wt 126 lb 9.6 oz (57.4 kg)   SpO2 97%   BMI 21.80 kg/m     Manual recheck of blood pressure was 162/84 in office  Wt Readings from Last 3 Encounters:  12/18/23 126 lb 9.6 oz (57.4 kg)  11/06/23 121 lb (54.9 kg)  11/04/23 122 lb 12.8 oz (55.7 kg)     GEN: no distress.  Appears younger than stated  age CARD: RRR, No MRG RESP: No IWOB. CTAB.        ASSESSMENT AND PLAN:    1. Persistent atrial fibrillation (HCC)   2. Encounter for long-term (current) use of high-risk medication   3. Primary hypertension     #Persistent atrial fibrillation #High risk med monitoring-amiodarone The patient has persistent symptomatic atrial fibrillation.  She is currently being maintained on amiodarone but prefers to avoid long-term exposure to this medication given the risk of off target effects.  I have discussed treatment options with the patient including antiarrhythmic drugs and catheter ablation.  I discussed the catheter ablation procedure in detail including the risks, recovery and likelihood of success.  She would like to proceed with scheduling.  Discussed treatment options today for AF including antiarrhythmic drug therapy and ablation. Discussed risks, recovery and likelihood of success with each treatment strategy. Risk, benefits, and alternatives to EP study and ablation for afib were discussed. These risks include but are not limited to stroke, bleeding, vascular damage, tamponade, perforation, damage to the esophagus, lungs, phrenic nerve and other structures, pulmonary vein stenosis, worsening renal function, coronary vasospasm and death.  Discussed potential need for repeat ablation procedures and antiarrhythmic drugs after an initial ablation. The patient understands these risk and wishes to proceed.  We will therefore proceed with catheter ablation at the next available time.  Carto, ICE, anesthesia are requested  for the procedure.  Will also obtain CT PV protocol prior to the procedure to exclude LAA thrombus and further evaluate atrial anatomy.  #Hypertension Above goal today.  Recommend checking blood pressures 1-2 times per week at home and recording the values.  Recommend bringing these recordings to the primary care physician. I suspect her blood pressure is elevated because of her  recent steroid injection in the knee.  I have encouraged her to check her blood pressures regularly over the next few days.  She should immediately notify her primary care physician if her blood pressures do not go back to normal over the next couple of days.       Signed, Rossie Muskrat. Lalla Brothers, MD, [facility], [facility] 12/18/2023 9:10 PM    Electrophysiology [facility]

## 2023-12-18 NOTE — Patient Instructions (Addendum)
 Medication Instructions:  Your physician recommends that you continue on your current medications as directed. Please refer to the Current Medication list given to you today.  *If you need a refill on your cardiac medications before your next appointment, please call your pharmacy*  Lab Work: BMET and CBC - you may go to any [facility] location to have these drawn within 30 days of your procedure (anytime after April 23rd)  Testing/Procedures: Cardiac CT Your physician has requested that you have cardiac CT. Cardiac computed tomography (CT) is a painless test that uses an x-ray machine to take clear, detailed pictures of your heart. For further information please visit [email or web address] Please follow instruction sheet as given. We will call you to schedule your CT scan. It will be done about three weeks prior to your ablation.  Ablation Your physician has recommended that you have an ablation. Catheter ablation is a medical procedure used to treat some cardiac arrhythmias (irregular heartbeats). During catheter ablation, a long, thin, flexible tube is put into a blood vessel in your groin (upper thigh), or neck. This tube is called an ablation catheter. It is then guided to your heart through the blood vessel. Radio frequency waves destroy small areas of heart tissue where abnormal heartbeats may cause an arrhythmia to start.  You are scheduled for Atrial Fibrillation Ablation on Friday, May 23 with Dr. Steffanie Dunn.Please arrive at the Main Entrance A at [facility]: [address] [place], [place] [address] at 5:30 AM   Follow-Up: At [facility], you and your health needs are our priority.  As part of our continuing mission to provide you with exceptional heart care, we have created designated Provider Care Teams.  These Care Teams include your primary Cardiologist (physician) and Advanced Practice Providers (APPs -  Physician Assistants and Nurse Practitioners) who all work  together to provide you with the care you need, when you need it.   Your next appointment:   We will contact you about your post-procedure follow up appointments.

## 2023-12-23 ENCOUNTER — Telehealth: Payer: Self-pay | Admitting: Cardiology

## 2023-12-23 NOTE — Telephone Encounter (Signed)
 Patient c/o Palpitations:  High priority if patient c/o lightheadedness, shortness of breath, or chest pain  How long have you had palpitations/irregular HR/ Afib? Are you having the symptoms now?  Kardia Mobile/smart watch show afib/out of rhythm today  Are you currently experiencing lightheadedness, SOB or CP?  Mild lightheadedness  Do you have a history of afib (atrial fibrillation) or irregular heart rhythm?  Afib on and off since January  Have you checked your BP or HR? (document readings if available):  HR is 95-112 today, normally stays below 60  BP was 136/87 earlier this morning which is higher than usual for her, 144/87 about 15 minutes ago  Are you experiencing any other symptoms?  Light headache, mild lightheadedness

## 2023-12-23 NOTE — Telephone Encounter (Signed)
 Pt is calling in to let Dr. Lalla Brothers and Dr. Tenny Craw know her current afib status as of today.  Pt states that she is for the most part always in afib and asymptomatic with this.  She states today she has developed symptoms and heart rates are more elevated than normal baseline rates.   Pt states as of this morning her rates have been in the 90s to 110 bpm.  She states she is dizzy and has a mild headache.  Pt states she woke up this morning and ate her breakfast, and checked her Kardia mobile App and apple watch, as she routinely does every morning.  She states when she checked both, she noted her rates were 93 bpm BP-136/87.  She then took her amiodarone and other cardiac meds and proceeded with her usual morning walk.   Pt states when she returned from her morning walk, she then rested for about 30 mins and rechecked both Apps and noted a HR-110 bpm and BP-144/87. She also developed a mild headache with dizziness at that time.  She denied any other cardiac complaints like chest pain, sob, doe, pre-syncopal or syncopal episodes.   Pt states she recently saw Dr. Lalla Brothers for her afib and is currently scheduled for an afib ablation at the end of May.  Pt is reaching out to ask if Dr. Lalla Brothers thinks she should have a PRN medication to take if she develops symptoms or if rates exceed a certain parameter.  She states with her rates exceeding her baseline, this makes her super anxious as well as causes her to feel symptomatic.  She states it would give her peace-of-mind having a PRN med to take for symptoms and elevated heart rates, while awaiting scheduled afib ablation on 5/23.   Pt is aware that I will route this message to Dr. Lalla Brothers and his RN for further advisement and follow-up.   ED precautions provided to the pt if symptoms/heart rates worsen.  Pt verbalized understanding and agrees with this plan.

## 2023-12-25 MED ORDER — METOPROLOL TARTRATE 25 MG PO TABS: 25.0000 mg | ORAL_TABLET | Freq: Two times a day (BID) | ORAL | 3 refills | Status: DC | PRN

## 2023-12-25 NOTE — Telephone Encounter (Signed)
 Patient is aware of recommendations from Dr. Tenny Craw. Prescription has been sent in

## 2023-12-28 ENCOUNTER — Other Ambulatory Visit: Payer: Self-pay | Admitting: Family

## 2024-01-01 ENCOUNTER — Encounter: Payer: Self-pay | Admitting: Cardiology

## 2024-01-01 ENCOUNTER — Other Ambulatory Visit: Payer: Self-pay | Admitting: Internal Medicine

## 2024-01-06 ENCOUNTER — Encounter: Payer: Self-pay | Admitting: Internal Medicine

## 2024-01-06 MED ORDER — LEVOTHYROXINE SODIUM 88 MCG PO TABS: 88.0000 ug | ORAL_TABLET | Freq: Every morning | ORAL | 0 refills | Status: DC

## 2024-01-06 NOTE — Telephone Encounter (Signed)
 2nd thread sent to Dr. Fabian Sharp.

## 2024-01-06 NOTE — Telephone Encounter (Signed)
 Sent in a refill 90 days  x 1

## 2024-01-10 ENCOUNTER — Encounter: Payer: Self-pay | Admitting: Internal Medicine

## 2024-01-10 NOTE — Progress Notes (Signed)
 Low bone density  down from previous but not in the osteoporosis range . Optimize healthy diet  protein 1.2 grams per kg per day and with bearing resistance exercise to maintain bone mass. . Recheck dex in 2 year

## 2024-01-20 DIAGNOSIS — M25562 Pain in left knee: Secondary | ICD-10-CM | POA: Diagnosis not present

## 2024-01-27 ENCOUNTER — Other Ambulatory Visit: Payer: Self-pay | Admitting: Cardiology

## 2024-01-27 DIAGNOSIS — I4819 Other persistent atrial fibrillation: Secondary | ICD-10-CM | POA: Diagnosis not present

## 2024-01-27 DIAGNOSIS — Z79899 Other long term (current) drug therapy: Secondary | ICD-10-CM | POA: Diagnosis not present

## 2024-01-27 LAB — BASIC METABOLIC PANEL WITH GFR
BUN/Creatinine Ratio: 24 (ref 12–28)
BUN: 19 mg/dL (ref 8–27)
CO2: 26 mmol/L (ref 20–29)
Calcium: 9.1 mg/dL (ref 8.7–10.3)
Chloride: 99 mmol/L (ref 96–106)
Creatinine, Ser: 0.78 mg/dL (ref 0.57–1.00)
Glucose: 69 mg/dL — ABNORMAL LOW (ref 70–99)
Potassium: 4 mmol/L (ref 3.5–5.2)
Sodium: 141 mmol/L (ref 134–144)
eGFR: 77 mL/min/{1.73_m2} (ref >59)

## 2024-01-27 LAB — CBC
Hematocrit: 43.6 % (ref 34.0–46.6)
Hemoglobin: 14.4 g/dL (ref 11.1–15.9)
MCH: 32.5 pg (ref 26.6–33.0)
MCHC: 33 g/dL (ref 31.5–35.7)
MCV: 98 fL — ABNORMAL HIGH (ref 79–97)
Platelets: 312 10*3/uL (ref 150–450)
RBC: 4.43 x10E6/uL (ref 3.77–5.28)
RDW: 13 % (ref 11.7–15.4)
WBC: 4.4 10*3/uL (ref 3.4–10.8)

## 2024-01-28 ENCOUNTER — Other Ambulatory Visit: Payer: Self-pay | Admitting: *Deleted

## 2024-01-28 DIAGNOSIS — Z79899 Other long term (current) drug therapy: Secondary | ICD-10-CM

## 2024-01-28 LAB — T4, FREE: Free T4: 2.48 ng/dL — ABNORMAL HIGH (ref 0.82–1.77)

## 2024-01-28 LAB — TSH: TSH: 2.97 u[IU]/mL (ref 0.450–4.500)

## 2024-02-03 ENCOUNTER — Ambulatory Visit (HOSPITAL_COMMUNITY)
Admission: RE | Admit: 2024-02-03 | Discharge: 2024-02-03 | Disposition: A | Discharge status: Home / Self Care | Source: Ambulatory Visit | Attending: Cardiology | Admitting: Cardiology

## 2024-02-03 DIAGNOSIS — I4819 Other persistent atrial fibrillation: Secondary | ICD-10-CM | POA: Insufficient documentation

## 2024-02-03 DIAGNOSIS — I517 Cardiomegaly: Secondary | ICD-10-CM | POA: Diagnosis not present

## 2024-02-03 DIAGNOSIS — I700 Atherosclerosis of aorta: Secondary | ICD-10-CM | POA: Diagnosis not present

## 2024-02-03 MED ORDER — IOHEXOL 350 MG/ML SOLN
100.0000 mL | Freq: Once | INTRAVENOUS | Status: AC | PRN
  Administered 2024-02-03: 100 mL via INTRAVENOUS

## 2024-02-12 ENCOUNTER — Encounter: Admitting: Family Medicine

## 2024-02-16 ENCOUNTER — Telehealth (HOSPITAL_COMMUNITY): Payer: Self-pay

## 2024-02-16 NOTE — Telephone Encounter (Signed)
 Patient returned call to discuss upcoming procedure.   CT: completed.  Labs: completed.   Any recent signs of acute illness or been started on antibiotics? No Any new medications started? No Any medications to hold? No Any missed doses of blood thinner? No Advised patient to continue taking ANTICOAGULANT: Eliquis  (Apixaban ) twice daily without missing any doses.  Medication instructions:  On the morning of your procedure DO NOT take any medication., including Eliquis  or the procedure may be rescheduled. Nothing to eat or drink after midnight prior to your procedure.  Confirmed patient is scheduled for Atrial Fibrillation Ablation on Friday, May 23 with Dr. Harvie Liner. Instructed patient to arrive at the Main Entrance A at [facility]: [address] [place], [place] [address] and check in at Admitting at 5:30 AM.  Advised of plan to go home the same day and will only stay overnight if medically necessary. You MUST have a responsible adult to drive you home and MUST be with you the first 24 hours after you arrive home or your procedure could be cancelled.  Patient verbalized understanding to all instructions provided and agreed to proceed with procedure.

## 2024-02-16 NOTE — Telephone Encounter (Signed)
 Attempted to reach patient to discuss upcoming procedure, no answer. Left VM for patient to return call at cell number. No answer at home number.

## 2024-02-19 NOTE — Pre-Procedure Instructions (Signed)
 Attempted to call patient regarding procedure instructions.  Left voicemail on the following items: Arrival time 0515 Nothing to eat or drink after midnight No meds AM of procedure Responsible person to drive you home and stay with you for 24 hrs  Have you missed any doses of anti-coagulant Eliquis- should be taken twice a day, if you have missed any doses please let us know.  Don't take dose morning of procedure.

## 2024-02-20 ENCOUNTER — Ambulatory Visit (HOSPITAL_BASED_OUTPATIENT_CLINIC_OR_DEPARTMENT_OTHER): Admitting: Anesthesiology

## 2024-02-20 ENCOUNTER — Other Ambulatory Visit: Payer: Self-pay

## 2024-02-20 ENCOUNTER — Encounter (HOSPITAL_COMMUNITY): Payer: Self-pay | Admitting: Cardiology

## 2024-02-20 ENCOUNTER — Other Ambulatory Visit (HOSPITAL_COMMUNITY): Payer: Self-pay

## 2024-02-20 ENCOUNTER — Encounter (HOSPITAL_COMMUNITY)
Admission: RE | Disposition: A | Discharge status: Home / Self Care | Payer: Self-pay | Source: Home / Self Care | Attending: Cardiology

## 2024-02-20 ENCOUNTER — Ambulatory Visit (HOSPITAL_COMMUNITY): Admitting: Anesthesiology

## 2024-02-20 ENCOUNTER — Ambulatory Visit (HOSPITAL_COMMUNITY)
Admission: RE | Admit: 2024-02-20 | Discharge: 2024-02-20 | Disposition: A | Discharge status: Home / Self Care | Attending: Cardiology | Admitting: Cardiology

## 2024-02-20 DIAGNOSIS — E039 Hypothyroidism, unspecified: Secondary | ICD-10-CM

## 2024-02-20 DIAGNOSIS — Z79899 Other long term (current) drug therapy: Secondary | ICD-10-CM | POA: Diagnosis not present

## 2024-02-20 DIAGNOSIS — I10 Essential (primary) hypertension: Secondary | ICD-10-CM

## 2024-02-20 DIAGNOSIS — I4819 Other persistent atrial fibrillation: Secondary | ICD-10-CM | POA: Diagnosis not present

## 2024-02-20 DIAGNOSIS — I4891 Unspecified atrial fibrillation: Secondary | ICD-10-CM

## 2024-02-20 DIAGNOSIS — E785 Hyperlipidemia, unspecified: Secondary | ICD-10-CM

## 2024-02-20 DIAGNOSIS — I495 Sick sinus syndrome: Secondary | ICD-10-CM | POA: Insufficient documentation

## 2024-02-20 LAB — POCT ACTIVATED CLOTTING TIME: Activated Clotting Time: 227 s

## 2024-02-20 MED ORDER — SUGAMMADEX SODIUM 200 MG/2ML IV SOLN
INTRAVENOUS | Status: DC | PRN
  Administered 2024-02-20: 200 mg via INTRAVENOUS

## 2024-02-20 MED ORDER — ACETAMINOPHEN 325 MG PO TABS: 650.0000 mg | ORAL_TABLET | ORAL | Status: DC | PRN

## 2024-02-20 MED ORDER — PANTOPRAZOLE SODIUM 40 MG PO TBEC
40.0000 mg | DELAYED_RELEASE_TABLET | Freq: Every day | ORAL | 0 refills | Status: DC
  Filled 2024-02-20: qty 45, 45d supply, fill #0

## 2024-02-20 MED ORDER — SODIUM CHLORIDE 0.9% FLUSH: 3.0000 mL | INTRAVENOUS | Status: DC | PRN

## 2024-02-20 MED ORDER — DEXAMETHASONE SODIUM PHOSPHATE 10 MG/ML IJ SOLN
INTRAMUSCULAR | Status: DC | PRN
  Administered 2024-02-20: 10 mg via INTRAVENOUS

## 2024-02-20 MED ORDER — FENTANYL CITRATE (PF) 100 MCG/2ML IJ SOLN
INTRAMUSCULAR | Status: AC
Start: 2024-02-20 — End: ?
  Filled 2024-02-20: qty 2

## 2024-02-20 MED ORDER — ATROPINE SULFATE 1 MG/10ML IJ SOSY
PREFILLED_SYRINGE | INTRAMUSCULAR | Status: AC
  Filled 2024-02-20: qty 10

## 2024-02-20 MED ORDER — SUCCINYLCHOLINE CHLORIDE 200 MG/10ML IV SOSY
PREFILLED_SYRINGE | INTRAVENOUS | Status: DC | PRN
Start: 2024-02-20 — End: 2024-02-20
  Administered 2024-02-20: 800 mg via INTRAVENOUS

## 2024-02-20 MED ORDER — APIXABAN 5 MG PO TABS
5.0000 mg | ORAL_TABLET | Freq: Two times a day (BID) | ORAL | Status: DC
  Administered 2024-02-20: 5 mg via ORAL
  Filled 2024-02-20: qty 1

## 2024-02-20 MED ORDER — PHENYLEPHRINE 80 MCG/ML (10ML) SYRINGE FOR IV PUSH (FOR BLOOD PRESSURE SUPPORT)
PREFILLED_SYRINGE | INTRAVENOUS | Status: DC | PRN
  Administered 2024-02-20 (×2): 80 ug via INTRAVENOUS

## 2024-02-20 MED ORDER — SODIUM CHLORIDE 0.9 % IV SOLN: INTRAVENOUS | Status: DC

## 2024-02-20 MED ORDER — PANTOPRAZOLE SODIUM 40 MG PO TBEC
40.0000 mg | DELAYED_RELEASE_TABLET | Freq: Every day | ORAL | Status: DC
  Administered 2024-02-20: 40 mg via ORAL
  Filled 2024-02-20 (×2): qty 1

## 2024-02-20 MED ORDER — ONDANSETRON HCL 4 MG/2ML IJ SOLN
INTRAMUSCULAR | Status: DC | PRN
  Administered 2024-02-20: 4 mg via INTRAVENOUS

## 2024-02-20 MED ORDER — HEPARIN SODIUM (PORCINE) 1000 UNIT/ML IJ SOLN
INTRAMUSCULAR | Status: AC
  Filled 2024-02-20: qty 20

## 2024-02-20 MED ORDER — SODIUM CHLORIDE 0.9% FLUSH: 3.0000 mL | Freq: Two times a day (BID) | INTRAVENOUS | Status: DC

## 2024-02-20 MED ORDER — LIDOCAINE 2% (20 MG/ML) 5 ML SYRINGE
INTRAMUSCULAR | Status: DC | PRN
Start: 2024-02-20 — End: 2024-02-20
  Administered 2024-02-20: 60 mg via INTRAVENOUS

## 2024-02-20 MED ORDER — PROTAMINE SULFATE 10 MG/ML IV SOLN
INTRAVENOUS | Status: DC | PRN
  Administered 2024-02-20: 30 mg via INTRAVENOUS

## 2024-02-20 MED ORDER — HEPARIN (PORCINE) IN NACL 1000-0.9 UT/500ML-% IV SOLN
INTRAVENOUS | Status: DC | PRN
  Administered 2024-02-20 (×3): 500 mL

## 2024-02-20 MED ORDER — PROPOFOL 10 MG/ML IV BOLUS
INTRAVENOUS | Status: DC | PRN
  Administered 2024-02-20: 120 mg via INTRAVENOUS

## 2024-02-20 MED ORDER — ATROPINE SULFATE 1 MG/ML IV SOLN
INTRAVENOUS | Status: DC | PRN
  Administered 2024-02-20: 1 mg via INTRAVENOUS

## 2024-02-20 MED ORDER — COLCHICINE 0.6 MG PO TABS
0.6000 mg | ORAL_TABLET | Freq: Two times a day (BID) | ORAL | 0 refills | Status: DC
  Filled 2024-02-20: qty 10, 5d supply, fill #0

## 2024-02-20 MED ORDER — ONDANSETRON HCL 4 MG/2ML IJ SOLN: 4.0000 mg | Freq: Four times a day (QID) | INTRAMUSCULAR | Status: DC | PRN

## 2024-02-20 MED ORDER — SODIUM CHLORIDE 0.9 % IV SOLN: 250.0000 mL | INTRAVENOUS | Status: DC | PRN

## 2024-02-20 MED ORDER — ROCURONIUM BROMIDE 10 MG/ML (PF) SYRINGE
PREFILLED_SYRINGE | INTRAVENOUS | Status: DC | PRN
  Administered 2024-02-20: 50 mg via INTRAVENOUS

## 2024-02-20 MED ORDER — PHENYLEPHRINE HCL-NACL 20-0.9 MG/250ML-% IV SOLN
INTRAVENOUS | Status: DC | PRN
  Administered 2024-02-20: 20 ug/min via INTRAVENOUS

## 2024-02-20 MED ORDER — COLCHICINE 0.6 MG PO TABS
0.6000 mg | ORAL_TABLET | Freq: Two times a day (BID) | ORAL | Status: DC
Start: 2024-02-20 — End: 2024-02-20
  Administered 2024-02-20: 0.6 mg via ORAL
  Filled 2024-02-20 (×2): qty 1

## 2024-02-20 MED ORDER — FENTANYL CITRATE (PF) 100 MCG/2ML IJ SOLN
INTRAMUSCULAR | Status: DC | PRN
  Administered 2024-02-20: 100 ug via INTRAVENOUS

## 2024-02-20 MED ORDER — HEPARIN SODIUM (PORCINE) 1000 UNIT/ML IJ SOLN
INTRAMUSCULAR | Status: DC | PRN
  Administered 2024-02-20: 7000 [IU] via INTRAVENOUS
  Administered 2024-02-20: 8000 [IU] via INTRAVENOUS

## 2024-02-20 SURGICAL SUPPLY — 18 items
BAG SNAP BAND KOVER 36X36 (MISCELLANEOUS) IMPLANT
CABLE PFA RX CATH CONN (CABLE) IMPLANT
CATH FARAWAVE ABLATION 31 (CATHETERS) IMPLANT
CATH GE 8FR SOUNDSTAR (CATHETERS) IMPLANT
CATH OCTARAY 2.0 F 3-3-3-3-3 (CATHETERS) IMPLANT
CATH WEBSTER BI DIR CS D-F CRV (CATHETERS) IMPLANT
CLOSURE PERCLOSE PROSTYLE (VASCULAR PRODUCTS) IMPLANT
COVER SWIFTLINK CONNECTOR (BAG) ×1 IMPLANT
DILATOR VESSEL 38 20CM 16FR (INTRODUCER) IMPLANT
GUIDEWIRE INQWIRE 1.5J.035X260 (WIRE) IMPLANT
KIT VERSACROSS CNCT FARADRIVE (KITS) IMPLANT
PACK EP LF (CUSTOM PROCEDURE TRAY) ×1 IMPLANT
PAD DEFIB RADIO PHYSIO CONN (PAD) ×1 IMPLANT
PATCH CARTO3 (PAD) IMPLANT
SHEATH FARADRIVE STEERABLE (SHEATH) IMPLANT
SHEATH PINNACLE 8F 10CM (SHEATH) IMPLANT
SHEATH PINNACLE 9F 10CM (SHEATH) IMPLANT
SHEATH PROBE COVER 6X72 (BAG) IMPLANT

## 2024-02-20 NOTE — Discharge Instructions (Signed)

## 2024-02-20 NOTE — Anesthesia Preprocedure Evaluation (Signed)
 Anesthesia Evaluation  Patient identified by MRN, date of birth, ID band Patient awake    Reviewed: Allergy & Precautions, NPO status , Patient's Chart, lab work & pertinent test results  Airway Mallampati: II  TM Distance: >3 FB Neck ROM: Full    Dental no notable dental hx.    Pulmonary neg pulmonary ROS   Pulmonary exam normal        Cardiovascular hypertension,  Rhythm:Irregular Rate:Normal     Neuro/Psych negative neurological ROS  negative psych ROS   GI/Hepatic negative GI ROS, Neg liver ROS,,,  Endo/Other  Hypothyroidism    Renal/GU   negative genitourinary   Musculoskeletal  (+) Arthritis , Osteoarthritis,    Abdominal Normal abdominal exam  (+)   Peds  Hematology Lab Results      Component                Value               Date                      WBC                      4.4                 01/27/2024                HGB                      14.4                01/27/2024                HCT                      43.6                01/27/2024                MCV                      98 (H)              01/27/2024                PLT                      312                 01/27/2024             Lab Results      Component                Value               Date                      NA                       141                 01/27/2024                K                        4.0  01/27/2024                CO2                      26                  01/27/2024                GLUCOSE                  69 (L)              01/27/2024                BUN                      19                  01/27/2024                CREATININE               0.78                01/27/2024                CALCIUM                   9.1                 01/27/2024                GFR                      77.67               11/06/2023                EGFR                     77                  01/27/2024                 GFRNONAA                 >60                 10/11/2023              Anesthesia Other Findings   Reproductive/Obstetrics                             Anesthesia Physical Anesthesia Plan  ASA: 3  Anesthesia Plan: General   Post-op Pain Management:    Induction: Intravenous  PONV Risk Score and Plan: 3 and Ondansetron , Dexamethasone  and Treatment may vary due to age or medical condition  Airway Management Planned: Mask and Oral ETT  Additional Equipment: None  Intra-op Plan:   Post-operative Plan: Extubation in OR  Informed Consent: I have reviewed the patients History and Physical, chart, labs and discussed the procedure including the risks, benefits and alternatives for the proposed anesthesia with the patient or authorized representative who has indicated his/her understanding and acceptance.     Dental advisory given  Plan Discussed with: CRNA  Anesthesia Plan Comments:        Anesthesia Quick Evaluation

## 2024-02-20 NOTE — Progress Notes (Signed)
 Pt ambulated to and from bathroom to void with no signs of oozing from bilateral groin sites

## 2024-02-20 NOTE — H&P (Signed)
 Electrophysiology Office Note:     Date:  02/20/2024    ID:  Crystal Odom, Crystal Odom 10-Aug-1941, MRN [id number]   [facility] Cardiologist:  Ola Berger, MD  [facility] Electrophysiologist:  Boyce Byes, MD    Referring MD: Gabino Joe, PA-C    Chief Complaint: Atrial fibrillation   History of Present Illness:     Ms. Crystal Odom is an 83 year old woman who I am seeing today for an evaluation of paroxysmal atrial fibrillation at the request of Crystal Single, PA-C.  The patient has a history of mild aortic stenosis, hypertension, hyperlipidemia, AAA.   The patient is with her husband during today's clinic appointment.  She reports symptomatic atrial fibrillation.  Her daughter is a family physician.  After she experienced recurrence of atrial fibrillation following a cardioversion she was started on amiodarone .  She now takes 100 mg of amiodarone  by mouth once daily.  She also takes Eliquis  for stroke prophylaxis.  No bleeding issues on Eliquis .  She is interested in avoiding long-term exposure to amiodarone  which I think is very reasonable.  She is interested in pursuing catheter ablation.  Presents for AF ablation. Procedure reviewed.   Objective Their past medical, social and family history was reviewed.     ROS:   Please see the history of present illness.    All other systems reviewed and are negative.   EKGs/Labs/Other Studies Reviewed:     The following studies were reviewed today:   September 10, 2023 echo EF 60-65 RV normal Dilated left atrium Mild MR Mild AS          Physical Exam:     VS:  BP 134/96 (BP Location: Left Arm, Patient Position: Sitting, Cuff Size: Normal)   Pulse 104   Ht 5' 3.9" (1.623 m)   Wt 126 lb 9.6 oz (57.4 kg)   SpO2 97%   BMI 21.80 kg/m      Manual recheck of blood pressure was 162/84 in office      Wt Readings from Last 3 Encounters:  12/18/23 126 lb 9.6 oz (57.4 kg)  11/06/23 121 lb (54.9 kg)  11/04/23 122  lb 12.8 oz (55.7 kg)      GEN: no distress.  Appears younger than stated age CARD: irreg irreg, No MRG RESP: No IWOB. CTAB.         Assessment ASSESSMENT AND PLAN:     1. Persistent atrial fibrillation (HCC)   2. Encounter for long-term (current) use of high-risk medication   3. Primary hypertension       #Persistent atrial fibrillation #High risk med monitoring-amiodarone  The patient has persistent symptomatic atrial fibrillation.  She is currently being maintained on amiodarone  but prefers to avoid long-term exposure to this medication given the risk of off target effects.  I have discussed treatment options with the patient including antiarrhythmic drugs and catheter ablation.  I discussed the catheter ablation procedure in detail including the risks, recovery and likelihood of success.  She would like to proceed with scheduling.   Discussed treatment options today for AF including antiarrhythmic drug therapy and ablation. Discussed risks, recovery and likelihood of success with each treatment strategy. Risk, benefits, and alternatives to EP study and ablation for afib were discussed. These risks include but are not limited to stroke, bleeding, vascular damage, tamponade, perforation, damage to the esophagus, lungs, phrenic nerve and other structures, pulmonary vein stenosis, worsening renal function, coronary vasospasm and death.  Discussed potential need for repeat ablation procedures  and antiarrhythmic drugs after an initial ablation. The patient understands these risk and wishes to proceed.  We will therefore proceed with catheter ablation at the next available time.  Carto, ICE, anesthesia are requested for the procedure.  Will also obtain CT PV protocol prior to the procedure to exclude LAA thrombus and further evaluate atrial anatomy.      Presents for AF ablation today. Procedure reviewed.       Signed, Leanora Prophet. Marven Slimmer, MD, [facility], [facility] 02/20/2024 Electrophysiology [facility]

## 2024-02-20 NOTE — Anesthesia Procedure Notes (Signed)
 Procedure Name: Intubation Date/Time: 02/20/2024 7:41 AM  Performed by: Luwanna Sam, CRNAPre-anesthesia Checklist: Patient identified, Emergency Drugs available, Suction available, Patient being monitored and Timeout performed Patient Re-evaluated:Patient Re-evaluated prior to induction Oxygen Delivery Method: Circle system utilized Preoxygenation: Pre-oxygenation with 100% oxygen Induction Type: IV induction Ventilation: Mask ventilation without difficulty and Oral airway inserted - appropriate to patient size Laryngoscope Size: Mac and 3 Grade View: Grade II Tube type: Oral Tube size: 7.0 mm Number of attempts: 1 Placement Confirmation: ETT inserted through vocal cords under direct vision, positive ETCO2 and breath sounds checked- equal and bilateral Secured at: 22 cm Tube secured with: Tape Dental Injury: Teeth and Oropharynx as per pre-operative assessment

## 2024-02-20 NOTE — Transfer of Care (Signed)
 Immediate Anesthesia Transfer of Care Note  Patient: Crystal Odom  Procedure(s) Performed: ATRIAL FIBRILLATION ABLATION  Patient Location: PACU and Cath Lab  Anesthesia Type:General  Level of Consciousness: awake, alert , and patient cooperative  Airway & Oxygen Therapy: Patient Spontanous Breathing  Post-op Assessment: Report given to RN and Post -op Vital signs reviewed and stable  Post vital signs: Reviewed and stable  Last Vitals:  Vitals Value Taken Time  BP 94/62 02/20/24 0950  Temp 36.7 C 02/20/24 0919  Pulse 53 02/20/24 0951  Resp 18 02/20/24 0951  SpO2 94 % 02/20/24 0951  Vitals shown include unfiled device data.  Last Pain:  Vitals:   02/20/24 0919  TempSrc: Axillary  PainSc: 0-No pain         Complications: There were no known notable events for this encounter.

## 2024-02-20 NOTE — Anesthesia Postprocedure Evaluation (Signed)
 Anesthesia Post Note  Patient: Crystal Odom  Procedure(s) Performed: ATRIAL FIBRILLATION ABLATION     Patient location during evaluation: PACU Anesthesia Type: General Level of consciousness: awake and alert Pain management: pain level controlled Vital Signs Assessment: post-procedure vital signs reviewed and stable Respiratory status: spontaneous breathing, nonlabored ventilation, respiratory function stable and patient connected to nasal cannula oxygen Cardiovascular status: blood pressure returned to baseline and stable Postop Assessment: no apparent nausea or vomiting Anesthetic complications: no   There were no known notable events for this encounter.  Last Vitals:  Vitals:   02/20/24 1010 02/20/24 1015  BP: (!) 91/56 92/62  Pulse: (!) 50 (!) 51  Resp: 11 (!) 6  Temp:    SpO2: 96% 95%    Last Pain:  Vitals:   02/20/24 1011  TempSrc:   PainSc: 0-No pain                 Theotis Flake P Raylee Strehl

## 2024-02-24 ENCOUNTER — Encounter: Payer: Self-pay | Admitting: Cardiology

## 2024-02-25 MED FILL — Atropine Sulfate Soln Prefill Syr 1 MG/10ML (0.1 MG/ML): INTRAMUSCULAR | Qty: 1 | Status: AC

## 2024-02-26 ENCOUNTER — Encounter: Payer: Self-pay | Admitting: Emergency Medicine

## 2024-02-26 ENCOUNTER — Telehealth: Payer: Self-pay | Admitting: Internal Medicine

## 2024-02-26 NOTE — Telephone Encounter (Signed)
 Patient c/o Palpitations:  STAT if patient reporting lightheadedness, shortness of breath, or chest pain  How long have you had palpitations/irregular HR/ Afib? Are you having the symptoms now?   Yes  Are you currently experiencing lightheadedness, SOB or CP?   Lightheaded  Do you have a history of afib (atrial fibrillation) or irregular heart rhythm?   Yes  Have you checked your BP or HR? (document readings if available):   HR 125-135  Are you experiencing any other symptoms?  A little bit of a headache, fatigue  Patient stated around mid-day yesterday and noticed her HR was showing 117-120.    Patient noted she has metoprolol  tartrate to take as needed and wants advice on next steps.

## 2024-02-26 NOTE — Telephone Encounter (Signed)
 Spoke with pt who reports she has been in Afib since yesterday around lunch time.  Recent Afib Ablation on 02/20/2024 with Dr Marven Slimmer.  Current HR 130-135.  BP 110/80.  Pt reports some mild lightheadedness.  Denies CP or SOB.  Pt is taking medications as prescribed but has not tried taking any of her prn Metoprolol  Tartrate.  Pt advised it is not uncommon to have periodic episodes of Afib for the first 3 months after ablation.  Pt encouraged to take the Metoprolol  Tartrate 25mg  and will forward message to Dr Marven Slimmer and his RN for further recommendation.  Reviewed ED precautions.

## 2024-03-02 ENCOUNTER — Telehealth: Payer: Self-pay | Admitting: Cardiology

## 2024-03-02 NOTE — Telephone Encounter (Signed)
 Pt c/o BP issue:  1. What are your last 5 BP readings?  91/74 98/71 103/76 She stated her bp has been running this way for a couple of days now  And her HR is over 110 and up to 129. Pt had an ablation on 5/23  2. Are you having any other symptoms (ex. Dizziness, headache, blurred vision, passed out)? Pt has felt a little dizzy  3. What is your medication issue? NA

## 2024-03-02 NOTE — Telephone Encounter (Signed)
 Pt called in worried about her HR, which has been 107-120's, but usually in 110's. BP have usually been around 103/71. She has felt lightheaded on occasion. Offered a sooner appt with Afib Clinic for 03/03/24 at 9:00 am but pt not able to; for now will keep the 03/09/24 appt. She took the Lopressor  25 mg (1-2 doses) [on 02/27/24, 02/28/24, and 02/29/24], but then stopped it due to she was worried about low BP. Advised her that she could take another dose of Lopressor  25 mg , but if SBP<90 and/or DBP<50 to HOLD any Lopressor . Pt verbalized understanding. Also advised to bring her BP/HR log to OV and any information on [employer] and/or [employer]. ED precautions reviewed also. No other concerns.

## 2024-03-03 NOTE — Telephone Encounter (Signed)
 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 I would temporarily hold lisinopril  and that way continue metoprolol  as directed. Help her maintain lower HR until appt. Thank you   Returned a call back to the pt and endorsed the above recommendations from Minnie Amber PA-C with afib clinic.   Pt aware to hold her lisinopril  for now and continue her metoprolol  as directed, until her appt with Myrtha Ates PA-C in afib clinic on 03/09/24.   Hold note placed under her lisinopril  in her med list.  Pt verbalized understanding and agrees with this plan.

## 2024-03-07 ENCOUNTER — Encounter: Payer: Self-pay | Admitting: Family Medicine

## 2024-03-09 ENCOUNTER — Ambulatory Visit (HOSPITAL_COMMUNITY)
Admission: RE | Admit: 2024-03-09 | Discharge: 2024-03-09 | Disposition: A | Discharge status: Home / Self Care | Source: Ambulatory Visit | Attending: Physician Assistant | Admitting: Physician Assistant

## 2024-03-09 ENCOUNTER — Encounter (HOSPITAL_COMMUNITY): Payer: Self-pay | Admitting: Physician Assistant

## 2024-03-09 VITALS — BP 126/96 | HR 114 | Ht 64.0 in | Wt 124.0 lb

## 2024-03-09 DIAGNOSIS — D6869 Other thrombophilia: Secondary | ICD-10-CM

## 2024-03-09 DIAGNOSIS — I480 Paroxysmal atrial fibrillation: Secondary | ICD-10-CM | POA: Diagnosis not present

## 2024-03-09 DIAGNOSIS — I4819 Other persistent atrial fibrillation: Secondary | ICD-10-CM

## 2024-03-09 LAB — BASIC METABOLIC PANEL WITH GFR
BUN/Creatinine Ratio: 22 (ref 12–28)
BUN: 15 mg/dL (ref 8–27)
CO2: 27 mmol/L (ref 20–29)
Calcium: 9.3 mg/dL (ref 8.7–10.3)
Chloride: 104 mmol/L (ref 96–106)
Creatinine, Ser: 0.69 mg/dL (ref 0.57–1.00)
Glucose: 89 mg/dL (ref 70–99)
Potassium: 4.9 mmol/L (ref 3.5–5.2)
Sodium: 140 mmol/L (ref 134–144)
eGFR: 88 mL/min/{1.73_m2} (ref >59)

## 2024-03-09 LAB — CBC
Hematocrit: 39.9 % (ref 34.0–46.6)
Hemoglobin: 13.2 g/dL (ref 11.1–15.9)
MCH: 32 pg (ref 26.6–33.0)
MCHC: 33.1 g/dL (ref 31.5–35.7)
MCV: 97 fL (ref 79–97)
Platelets: 243 10*3/uL (ref 150–450)
RBC: 4.13 x10E6/uL (ref 3.77–5.28)
RDW: 13.8 % (ref 11.7–15.4)
WBC: 3.8 10*3/uL (ref 3.4–10.8)

## 2024-03-09 NOTE — Patient Instructions (Addendum)
 Day of cardioversion resume lisinopril  and stop metoprolol     Cardioversion scheduled for: Friday, June 13th   - Arrive at the [employer] "A" of [facility] ([address])  and check in with ADMITTING at 8:30am   - Do not eat or drink anything after midnight the night prior to your procedure.   - Take all your morning medication (except diabetic medications) with a sip of water prior to arrival.  - Do NOT miss any doses of your blood thinner - if you should miss a dose or take a dose more than 4 hours late -- please notify our office immediately.  - You will not be able to drive home after your procedure. Please ensure you have a responsible adult to drive you home. You will need someone with you for 24 hours post procedure.     - Expect to be in the procedural area approximately 2 hours.   - If you feel as if you go back into normal rhythm prior to scheduled cardioversion, please notify our office immediately.   If your procedure is canceled in the cardioversion suite you will be charged a cancellation fee.  bound)

## 2024-03-09 NOTE — Progress Notes (Signed)
 Primary Care Physician: Reginal Capra, MD Primary Cardiologist: Ola Berger, MD Electrophysiologist: Boyce Byes, MD  Referring Physician: Dr Aron Bien Crystal Odom is a 83 y.o. female with a history of AS, HTN, HLD, AAA, atrial fibrillation who presents for follow up in the [facility].  She was admitted in 10/2023 with atrial fibrillation w RVR in the setting of gastroenteritis. She underwent DCCV. Her hsTroponins were elevated c/w demand ischemia. She had recurrent AF w RVR and was seen by Dr. Lorie Rook in clinic on 10/23/23. She was started on Amiodarone  and set up for repeat DCCV. She was in NSR when she presented to the hospital on 10/31/23. She was seen by Dr Marven Slimmer and underwent afib ablation on 02/20/24. Amiodarone  was discontinued at the time of ablation due to sinus node dysfunction noted during EP study. Patient is on Eliquis  for stroke prevention.    Patient presents today for follow up for atrial fibrillation. She called [facility] with elevated heart rates and lightheadedness. Her lisinopril  was held to allow room in her BP to continue metoprolol . Personally reviewed Kardia mobile strips today. She appears to be persistent in afib since 02/25/24. There were no specific triggers that she could identify.   Today, she denies symptoms of chest pain, shortness of breath, orthopnea, PND, lower extremity edema, presyncope, syncope, snoring, daytime somnolence, bleeding, or neurologic sequela. The patient is tolerating medications without difficulties and is otherwise without complaint today.    Atrial Fibrillation Risk Factors:  she does not have symptoms or diagnosis of sleep apnea. she does not have a history of rheumatic fever.   Atrial Fibrillation Management history:  Previous antiarrhythmic drugs: amiodarone   Previous cardioversions: 10/10/23 Previous ablations: 02/20/24 Anticoagulation history: Eliquis   ROS- All systems are  reviewed and negative except as per the HPI above.  Past Medical History:  Diagnosis Date   A-fib (HCC)    Abnormal LFTs    nl liver bx positive antismooth muscle antibodies and neutropenia   Arthritis    left knee   Diplopia 09/30/2000   eval neg   Hyperlipidemia    HDL over 100   Hypothyroidism    Kidney stones    in past   Leukopenia    with neg hemevaluation bm bx get wbc diff q 6 months   UTI (lower urinary tract infection)     Current Outpatient Medications  Medication Sig Dispense Refill   acetaminophen  (TYLENOL ) 500 MG tablet Take 1,000 mg by mouth every 6 (six) hours as needed for moderate pain (pain score 4-6) or mild pain (pain score 1-3).     apixaban  (ELIQUIS ) 5 MG TABS tablet Take 1 tablet (5 mg total) by mouth 2 (two) times daily. 180 tablet 3   cholecalciferol (VITAMIN D3) 25 MCG (1000 UNIT) tablet Take 1,000 Units by mouth daily.     ezetimibe  (ZETIA ) 10 MG tablet Take 1 tablet (10 mg total) by mouth at bedtime.     levothyroxine  (SYNTHROID ) 88 MCG tablet Take 1 tablet (88 mcg total) by mouth in the morning. 90 tablet 0   metoprolol  tartrate (LOPRESSOR ) 25 MG tablet Take 1 tablet (25 mg total) by mouth 2 (two) times daily as needed. 180 tablet 3   pantoprazole  (PROTONIX ) 40 MG tablet Take 1 tablet (40 mg total) by mouth daily. 45 tablet 0   rosuvastatin  (CRESTOR ) 10 MG tablet Take 1 tablet (10 mg total) by mouth at bedtime.     lisinopril  (ZESTRIL ) 5  MG tablet Take 1 tablet (5 mg total) by mouth daily. (Patient not taking: Reported on 03/09/2024) 90 tablet 3   No current facility-administered medications for this encounter.    Physical Exam: BP (!) 126/96   Pulse (!) 114   Ht 5\' 4"  (1.626 m)   Wt 56.2 kg   BMI 21.28 kg/m   GEN: Well nourished, well developed in no acute distress CARDIAC: Irregularly irregular rate and rhythm, no murmurs, rubs, gallops RESPIRATORY:  Clear to auscultation without rales, wheezing or rhonchi  ABDOMEN: Soft, non-tender,  non-distended EXTREMITIES:  No edema; No deformity   Wt Readings from Last 3 Encounters:  03/09/24 56.2 kg  02/20/24 55.8 kg  12/18/23 57.4 kg     EKG today demonstrates  Afib Vent. rate 114 BPM PR interval * ms QRS duration 86 ms QT/QTcB 348/479 ms   Echo 09/10/23 demonstrated   1. Left ventricular ejection fraction, by estimation, is 60 to 65%. The  left ventricle has normal function. The left ventricle has no regional  wall motion abnormalities. Left ventricular diastolic parameters are  consistent with Grade I diastolic dysfunction (impaired relaxation).   2. Right ventricular systolic function is normal. The right ventricular  size is normal. There is normal pulmonary artery systolic pressure. The  estimated right ventricular systolic pressure is 31.7 mmHg.   3. Left atrial size was mildly dilated.   4. The mitral valve is normal in structure. Mild mitral valve  regurgitation. No evidence of mitral stenosis.   5. The aortic valve is tricuspid. There is moderate calcification of the  aortic valve. Aortic valve regurgitation is not visualized. Mild aortic  valve stenosis. Aortic valve mean gradient measures 10.0 mmHg.   6. The inferior vena cava is normal in size with greater than 50%  respiratory variability, suggesting right atrial pressure of 3 mmHg.    CHA2DS2-VASc Score = 4  The patient's score is based upon: CHF History: 0 HTN History: 0 Diabetes History: 0 Stroke History: 0 Vascular Disease History: 1 Age Score: 2 Gender Score: 1       ASSESSMENT AND PLAN: Persistent Atrial Fibrillation (ICD10:  I48.19) The patient's CHA2DS2-VASc score is 4, indicating a 4.8% annual risk of stroke.   S/p afib ablation 02/20/24, amiodarone  discontinued due to sinus node dysfunction. She is back in persistent afib. We discussed rhythm control options. Will plan for DCCV.  Check bmet/cbc Continue Eliquis  5 mg BID with no missed doses for 3 months post ablation.  Continue  Lopressor  25 mg BID  Secondary Hypercoagulable State (ICD10:  D68.69) The patient is at significant risk for stroke/thromboembolism based upon her CHA2DS2-VASc Score of 4.  Continue Apixaban  (Eliquis ). No bleeding issues.  HTN Stable on current regimen Continue to hold lisinopril  until back in SR.    Follow up in the AF clinic post DCCV.   Informed Consent   Shared Decision Making/Informed Consent The risks (stroke, cardiac arrhythmias rarely resulting in the need for a temporary or permanent pacemaker, skin irritation or burns and complications associated with conscious sedation including aspiration, arrhythmia, respiratory failure and death), benefits (restoration of normal sinus rhythm) and alternatives of a direct current cardioversion were explained in detail to Crystal Odom and she agrees to proceed.        [facility] [address] [place], [place] [address] [phone]

## 2024-03-10 ENCOUNTER — Ambulatory Visit (HOSPITAL_COMMUNITY): Payer: Self-pay | Admitting: Physician Assistant

## 2024-03-11 ENCOUNTER — Ambulatory Visit: Admitting: Family Medicine

## 2024-03-11 ENCOUNTER — Encounter: Payer: Self-pay | Admitting: Family Medicine

## 2024-03-11 ENCOUNTER — Telehealth (HOSPITAL_COMMUNITY): Payer: Self-pay | Admitting: *Deleted

## 2024-03-11 VITALS — BP 135/74

## 2024-03-11 DIAGNOSIS — Z0000 Encounter for general adult medical examination without abnormal findings: Secondary | ICD-10-CM | POA: Diagnosis not present

## 2024-03-11 NOTE — Telephone Encounter (Signed)
 Patient called in stating she returned to normal rhythm after one dose of metoprolol  Tuesday.  Kardia confirmed NSR with HRs in the 60-66 range. BP 138/70. Discussed with Myrtha Ates PA will cancel cardioversion and resume lisinopril .  Pt will call if issues arise prior to follow up in August for 3 month post ablation appt.
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
## 2024-03-11 NOTE — Patient Instructions (Signed)
 I really enjoyed getting to talk with you today! I am available on Tuesdays and Thursdays for virtual visits if you have any questions or concerns, or if I can be of any further assistance.   CHECKLIST FROM ANNUAL WELLNESS VISIT:  -Follow up (please call to schedule if not scheduled after visit):   -yearly for annual wellness visit with primary care office  Here is a list of your preventive care/health maintenance measures and the plan for each if any are due:  PLAN For any measures below that may be due:   Call to schedule your mammogram.  2. Check on vaccines and let us  know if already done and provide record. Thanks!  Health Maintenance  Topic Date Due   COVID-19 Vaccine (9 - Pfizer risk 2024-25 season) 11/28/2023   DTaP/Tdap/Td (3 - Td or Tdap) 07/05/2024 (Originally 02/03/2023)   INFLUENZA VACCINE  04/30/2024   Medicare Annual Wellness (AWV)  03/11/2025   Pneumococcal Vaccine: 50+ Years  Completed   DEXA SCAN  Completed   Zoster Vaccines- Shingrix  Completed   HPV VACCINES  Aged Out   Meningococcal B Vaccine  Aged Out   Colonoscopy  Discontinued   Hepatitis C Screening  Discontinued    -See a dentist at least yearly  -Get your eyes checked and then per your eye specialist's recommendations  -Other issues addressed today:   -I have included below further information regarding a healthy whole foods based diet, physical activity guidelines for adults, stress management and opportunities for social connections. I hope you find this information useful.   -----------------------------------------------------------------------------------------------------------------------------------------------------------------------------------------------------------------------------------------------------------    NUTRITION: -eat real food: lots of colorful vegetables (half the plate) and fruits -5-7 servings of vegetables and fruits per day (fresh or steamed is best), exp. 2  servings of vegetables with lunch and dinner and 2 servings of fruit per day. Berries and greens such as kale and collards are great choices.  -consume on a regular basis:  fresh fruits, fresh veggies, fish, nuts, seeds, healthy oils (such as olive oil, avocado oil), whole grains (make sure for bread/pasta/crackers/etc., that the first ingredient on label contains the word whole), legumes. -can eat small amounts of dairy and lean meat (no larger than the palm of your hand), but avoid processed meats such as ham, bacon, lunch meat, etc. -drink water -try to avoid fast food and pre-packaged foods, processed meat, ultra processed foods/beverages (donuts, candy, etc.) -most experts advise limiting sodium to < 2300mg  per day, should limit further is any chronic conditions such as high blood pressure, heart disease, diabetes, etc. The American Heart Association advised that < 1500mg  is is ideal -try to avoid foods/beverages that contain any ingredients with names you do not recognize  -try to avoid foods/beverages  with added sugar or sweeteners/sweets  -try to avoid sweet drinks (including diet drinks): soda, juice, Gatorade, sweet tea, power drinks, diet drinks -try to avoid white rice, white bread, pasta (unless whole grain)  EXERCISE GUIDELINES FOR ADULTS: -if you wish to increase your physical activity, do so gradually and with the approval of your doctor -STOP and seek medical care immediately if you have any chest pain, chest discomfort or trouble breathing when starting or increasing exercise  -move and stretch your body, legs, feet and arms when sitting for long periods -Physical activity guidelines for optimal health in adults: -get at least 150 minutes per week of moderate exercise (can talk, but not sing); this is about 20-30 minutes of sustained activity 5-7 days per week  or two 10-15 minute episodes of sustained activity 5-7 days per week -do some muscle building/resistance  training/strength training at least 2 days per week  -balance exercises 3+ days per week:   Stand somewhere where you have something sturdy to hold onto if you lose balance    1) lift up on toes, then back down, start with 5x per day and work up to 20x   2) stand and lift one leg straight out to the side so that foot is a few inches of the floor, start with 5x each side and work up to 20x each side   3) stand on one foot, start with 5 seconds each side and work up to 20 seconds on each side  If you need ideas or help with getting more active:  -Silver sneakers https://tools.silversneakers.com  -Walk with a Doc: http://www.duncan-williams.com/  -try to include resistance (weight lifting/strength building) and balance exercises twice per week: or the following link for ideas: http://castillo-powell.com/  BuyDucts.dk  STRESS MANAGEMENT: -can try meditating, or just sitting quietly with deep breathing while intentionally relaxing all parts of your body for 5 minutes daily -if you need further help with stress, anxiety or depression please follow up with your primary doctor or contact the wonderful folks at [facility]: [phone]  SOCIAL CONNECTIONS: -options in [place] if you wish to engage in more social and exercise related activities:  -Silver sneakers https://tools.silversneakers.com  -Walk with a Doc: http://www.duncan-williams.com/  -Check out the [facility] Active Adults 50+ section on the [place] [employer] (hiking clubs, book clubs, cards and games, chess, exercise classes, aquatic classes and much more) - see the website for details: https://www.[place].gov/departments/parks-recreation/active-adults50  -YouTube has lots of exercise videos for different ages and abilities as well  -[facility] (a variety of indoor and outdoor inperson activities for  adults). [phone]. [address]  -Virtual Online Classes (a variety of topics): see seniorplanet.org or call [phone]  -consider volunteering at a school, hospice center, church, senior center or elsewhere

## 2024-03-12 ENCOUNTER — Encounter (HOSPITAL_COMMUNITY): Admission: RE | Payer: Self-pay | Source: Home / Self Care

## 2024-03-12 ENCOUNTER — Ambulatory Visit (HOSPITAL_COMMUNITY): Admission: RE | Admit: 2024-03-12 | Source: Home / Self Care | Admitting: Cardiology

## 2024-03-12 DIAGNOSIS — I4819 Other persistent atrial fibrillation: Secondary | ICD-10-CM

## 2024-03-12 SURGERY — CARDIOVERSION (CATH LAB)
Anesthesia: General

## 2024-03-19 ENCOUNTER — Ambulatory Visit (HOSPITAL_COMMUNITY): Admitting: Physician Assistant

## 2024-03-22 ENCOUNTER — Ambulatory Visit (HOSPITAL_COMMUNITY): Admitting: Physician Assistant

## 2024-03-23 ENCOUNTER — Encounter: Payer: Self-pay | Admitting: Cardiology

## 2024-03-23 ENCOUNTER — Other Ambulatory Visit (HOSPITAL_COMMUNITY): Payer: Self-pay

## 2024-04-04 ENCOUNTER — Other Ambulatory Visit: Payer: Self-pay | Admitting: Internal Medicine

## 2024-04-16 ENCOUNTER — Encounter: Payer: Self-pay | Admitting: Advanced Practice Midwife

## 2024-05-12 ENCOUNTER — Encounter: Payer: Self-pay | Admitting: Internal Medicine

## 2024-05-16 NOTE — Telephone Encounter (Signed)
 So  anyone in the [place] or [place] office I would recommend  but  you would have to see who is taking new patients .  Dr Marylynn at [facility] office? I hear [place] [facility] is nice. Wishing you the best .

## 2024-05-16 NOTE — Telephone Encounter (Signed)
 Answered at different thread

## 2024-05-17 NOTE — Progress Notes (Addendum)
 Electrophysiology Office Note:   Date:  05/19/2024  ID:  Crystal, Odom Feb 13, 1941, MRN [id number]  Primary Cardiologist: Vina Gull, MD Primary Heart Failure: None Electrophysiologist: OLE ONEIDA HOLTS, MD      History of Present Illness:   Crystal Odom is a 83 y.o. female with h/o AF, AS, HTN, AAA, hypothyroidism seen today for electrophysiology follow up post ablation.   Seen in AF Clinic 03/09/24 and had been in AF since 02/25/24. She was set up for DCCV but went back into NSR before the procedure.   Since last being seen in our clinic the patient reports doing well after the initial episode ~ 2weeks after ablation. She has not had any further evidence of AF since that time. She feels well.   She denies chest pain, palpitations, dyspnea, PND, orthopnea, nausea, vomiting, dizziness, syncope, edema, weight gain, or early satiety.   Review of systems complete and found to be negative unless listed in HPI.   EP Information / Studies Reviewed:    EKG is ordered today. Personal review as below.  EKG Interpretation Date/Time:  Wednesday May 19 2024 13:56:43 EDT Ventricular Rate:  71 PR Interval:  234 QRS Duration:  86 QT Interval:  422 QTC Calculation: 458 R Axis:   63  Text Interpretation: Sinus rhythm with 1st degree A-V block Confirmed by Aniceto Jarvis ([id number]) on 05/19/2024 2:32:16 PM  Arrhythmia / AAD AF > diagnosed in 10/2023  Amiodarone  > stopped due to SND  DCCV 10/10/2023 EPS 02/20/24 > PVI ablation, posterior wall ablation (amio stopped)    Risk Assessment/Calculations:    CHA2DS2-VASc Score = 4   This indicates a 4.8% annual risk of stroke. The patient's score is based upon: CHF History: 0 HTN History: 0 Diabetes History: 0 Stroke History: 0 Vascular Disease History: 1 Age Score: 2 Gender Score: 1             Physical Exam:   VS:  BP 130/62   Pulse 66   Ht 5' 4 (1.626 m)   Wt 124 lb (56.2 kg)   SpO2 95%   BMI 21.28 kg/m     Wt Readings from Last 3 Encounters:  05/19/24 124 lb (56.2 kg)  03/09/24 124 lb (56.2 kg)  02/20/24 123 lb (55.8 kg)     GEN: Well nourished, well developed in no acute distress NECK: No JVD; No carotid bruits CARDIAC: Regular rate and rhythm, no murmurs, rubs, gallops RESPIRATORY:  Clear to auscultation without rales, wheezing or rhonchi  ABDOMEN: Soft, non-tender, non-distended EXTREMITIES:  No edema; No deformity   ASSESSMENT AND PLAN:    Persistent Atrial Fibrillation  CHA2DS2-VASc 4  -amiodarone  stopped post ablation  -continue PRN metoprolol  for HR sustained > 115 bpm  -monitors with Kardia Mobile & Apple Watch   -no further AF burden   Secondary Hypercoagulable State  -reduce Eliquis  to 2.5 mg BID, dose reviewed and appropriate by age / wt (82, 56 kg)   -no bleeding on eliquis    Hypertension  -well controlled on current regimen    Follow up with Dr. HOLTS / EP APP in 6 months  Signed, Jarvis Aniceto, NP-C, AGACNP-BC [facility] - Electrophysiology  05/19/2024, 5:58 PM    Addendum for Pre-Procedure Clearance 06/23/24  Procedure:  LEFT TOTAL KNEE ARTHROPLASTY Date of Surgery:  Clearance TBD  Surgeon:  TORIBIO LOOSE, MD Surgeon's Group or Practice Name:  [facility] Phone number:  [phone]  [phone] Fax number:  [phone]   ATTN: KELLY HIGH Type of Clearance Requested:   - Medical  - Pharmacy:  Hold Apixaban  (Eliquis )   Type of Anesthesia:  Spinal   Additional requests/questions:       Crystal Odom's perioperative risk of a major cardiac event is 0.4% according to the Revised Cardiac Risk Index (RCRI).  Therefore, she is at low risk for perioperative complications.     Recommendations: According to ACC/AHA guidelines, no further cardiovascular testing needed.  The patient may proceed to surgery at acceptable risk.   Antiplatelet and/or Anticoagulation Recommendations:  Eliqius hold to  be reviewed by pharmacy.   Daphne Barrack, NP-C, AGACNP-BC [facility] - Electrophysiology  06/23/2024, 9:20 AM

## 2024-05-19 ENCOUNTER — Other Ambulatory Visit: Payer: Self-pay

## 2024-05-19 ENCOUNTER — Ambulatory Visit: Attending: Cardiology | Admitting: Pulmonary Disease

## 2024-05-19 ENCOUNTER — Encounter: Payer: Self-pay | Admitting: Pulmonary Disease

## 2024-05-19 VITALS — BP 130/62 | HR 66 | Ht 64.0 in | Wt 124.0 lb

## 2024-05-19 DIAGNOSIS — I4819 Other persistent atrial fibrillation: Secondary | ICD-10-CM | POA: Diagnosis not present

## 2024-05-19 DIAGNOSIS — D6869 Other thrombophilia: Secondary | ICD-10-CM | POA: Diagnosis not present

## 2024-05-19 DIAGNOSIS — I10 Essential (primary) hypertension: Secondary | ICD-10-CM

## 2024-05-19 MED ORDER — APIXABAN 2.5 MG PO TABS: 2.5000 mg | ORAL_TABLET | Freq: Two times a day (BID) | ORAL | 1 refills | Status: AC

## 2024-05-19 NOTE — Patient Instructions (Signed)
 Medication Instructions:  Reduce Eliquis  to 2.5mg  BID (once in am and once in pm)   OK to take Metoprolol  if HR sustained > 115 bpm   *If you need a refill on your cardiac medications before your next appointment, please call your pharmacy*  Lab Work: No lab work today If you have labs (blood work) drawn today and your tests are completely normal, you will receive your results only by: MyChart Message (if you have MyChart) OR A paper copy in the mail If you have any lab test that is abnormal or we need to change your treatment, we will call you to review the results.  Testing/Procedures: No testing/procedures were scheduled today  Follow-Up: At [facility], you and your health needs are our priority.  As part of our continuing mission to provide you with exceptional heart care, our providers are all part of one team.  This team includes your primary Cardiologist (physician) and Advanced Practice Providers or APPs (Physician Assistants and Nurse Practitioners) who all work together to provide you with the care you need, when you need it.  If you have new onset AFib, please call the AF Clinic at [phone].      Your next appointment:   6 month(s)  Provider:   You may see OLE ONEIDA HOLTS, MD or one of the following Advanced Practice Providers on your designated Care Team:    Daphne Barrack, NP    We recommend signing up for the patient portal called MyChart.  Sign up information is provided on this After Visit Summary.  MyChart is used to connect with patients for Virtual Visits (Telemedicine).  Patients are able to view lab/test results, encounter notes, upcoming appointments, etc.  Non-urgent messages can be sent to your provider as well.   To learn more about what you can do with MyChart, go to [email or web address].

## 2024-05-25 DIAGNOSIS — M25562 Pain in left knee: Secondary | ICD-10-CM | POA: Diagnosis not present

## 2024-06-14 NOTE — Progress Notes (Signed)
   06/14/2024  Patient ID: Crystal Odom, female   DOB: 1941/04/07, 83 y.o.   MRN: [id number]  Pharmacy Quality Measure Review  This patient is appearing on a report for being at risk of failing the adherence measure for cholesterol (statin) medications this calendar year.   Medication: Rosuvastatin  10mg  Last fill date: 03/25/24 for 90 day supply  Insurance report was not up to date. No action needed at this time.  Does have refill available when it comes due. Spoke with patient and will coordinate a refill with pharmacy.  Jon VEAR Lindau, PharmD Clinical Pharmacist [phone]

## 2024-06-16 ENCOUNTER — Telehealth: Payer: Self-pay

## 2024-06-16 DIAGNOSIS — E039 Hypothyroidism, unspecified: Secondary | ICD-10-CM

## 2024-06-16 NOTE — Telephone Encounter (Signed)
-----   Message from Jon VEAR Lindau sent at 06/16/2024  2:26 PM EDT ----- Hey!  Crystal Odom would like a TSH and a Free T4 lab check. Dr. Charlett approved signing off this afternoon but I am having issues with my ordering function at the moment, I have an IT ticket placed.  Would you be able to place those orders for her? She wants to be able to go to the lab at [facility] to get it done if possible? Just let me know and I can call the patient to notify her.  Thank you! Jon VEAR Lindau, PharmD Clinical Pharmacist [phone]

## 2024-06-16 NOTE — Progress Notes (Signed)
   06/16/2024  Patient ID: Crystal Odom, female   DOB: December 11, 1940, 83 y.o.   MRN: [id number]  Notified patient that her TSH and Free T4 labs had been ordered by PCP for lab draw.  Jon VEAR Lindau, PharmD Clinical Pharmacist [phone]

## 2024-06-17 ENCOUNTER — Encounter: Payer: Self-pay | Admitting: Internal Medicine

## 2024-06-17 ENCOUNTER — Telehealth: Payer: Self-pay

## 2024-06-17 ENCOUNTER — Other Ambulatory Visit (INDEPENDENT_AMBULATORY_CARE_PROVIDER_SITE_OTHER)

## 2024-06-17 DIAGNOSIS — E039 Hypothyroidism, unspecified: Secondary | ICD-10-CM | POA: Diagnosis not present

## 2024-06-17 DIAGNOSIS — M25562 Pain in left knee: Secondary | ICD-10-CM | POA: Diagnosis not present

## 2024-06-17 LAB — TSH: TSH: 0.63 u[IU]/mL (ref 0.35–5.50)

## 2024-06-17 LAB — T4, FREE: Free T4: 1.44 ng/dL (ref 0.60–1.60)

## 2024-06-17 NOTE — Telephone Encounter (Signed)
   Pre-operative Risk Assessment    Patient Name: Crystal Odom  DOB: 1941/02/23 MRN: [id number]   Date of last office visit: 05/19/24 DAPHNE BARRACK, NP Date of next office visit: NONE   Request for Surgical Clearance    Procedure:  LEFT TOTAL KNEE ARTHROPLASTY  Date of Surgery:  Clearance TBD                                Surgeon:  TORIBIO LOOSE, MD Surgeon's Group or Practice Name:  [facility] Phone number:  [phone]  [phone] Fax number:  [phone]   ATTN: KELLY HIGH   Type of Clearance Requested:   - Medical  - Pharmacy:  Hold Apixaban  (Eliquis )     Type of Anesthesia:  Spinal   Additional requests/questions:    SignedLucie DELENA Ku   06/17/2024, 4:55 PM

## 2024-06-20 ENCOUNTER — Ambulatory Visit: Payer: Self-pay | Admitting: Internal Medicine

## 2024-06-20 DIAGNOSIS — E039 Hypothyroidism, unspecified: Secondary | ICD-10-CM

## 2024-06-20 NOTE — Progress Notes (Signed)
 Tsh and free t4 now normal  since off amiodarone  can follow for now  and repeat thyroid  tests in about 4-6 months to be sure stable

## 2024-06-21 NOTE — Telephone Encounter (Signed)
 Needs an appt for pre op since has been over 7 months since last visit   glad the card status is doing well.

## 2024-06-24 ENCOUNTER — Encounter: Payer: Self-pay | Admitting: Internal Medicine

## 2024-06-24 ENCOUNTER — Ambulatory Visit: Admitting: Internal Medicine

## 2024-06-24 VITALS — BP 126/78 | HR 76 | Temp 98.2°F | Ht 64.0 in | Wt 121.2 lb

## 2024-06-24 DIAGNOSIS — M1710 Unilateral primary osteoarthritis, unspecified knee: Secondary | ICD-10-CM | POA: Diagnosis not present

## 2024-06-24 DIAGNOSIS — Z7901 Long term (current) use of anticoagulants: Secondary | ICD-10-CM

## 2024-06-24 DIAGNOSIS — I480 Paroxysmal atrial fibrillation: Secondary | ICD-10-CM

## 2024-06-24 DIAGNOSIS — R7989 Other specified abnormal findings of blood chemistry: Secondary | ICD-10-CM

## 2024-06-24 DIAGNOSIS — E039 Hypothyroidism, unspecified: Secondary | ICD-10-CM | POA: Diagnosis not present

## 2024-06-24 DIAGNOSIS — Z01818 Encounter for other preprocedural examination: Secondary | ICD-10-CM

## 2024-06-24 DIAGNOSIS — E785 Hyperlipidemia, unspecified: Secondary | ICD-10-CM | POA: Diagnosis not present

## 2024-06-24 LAB — HEPATIC FUNCTION PANEL
ALT: 34 U/L (ref 0–35)
AST: 35 U/L (ref 0–37)
Albumin: 4.4 g/dL (ref 3.5–5.2)
Alkaline Phosphatase: 68 U/L (ref 39–117)
Bilirubin, Direct: 0.1 mg/dL (ref 0.0–0.3)
Total Bilirubin: 0.6 mg/dL (ref 0.2–1.2)
Total Protein: 7 g/dL (ref 6.0–8.3)

## 2024-06-24 NOTE — Patient Instructions (Signed)
 Good to see you today  Will get  form and note   to your surgeon  Cardiology pending

## 2024-06-24 NOTE — Progress Notes (Signed)
 Chief Complaint  Patient presents with   Pre-op Exam    Upcoming knee replacement surgery     HPI: Patient  Crystal Odom  83 y.o. comes in today for preop evaluation regarding left knee DJD/OA end stage .  To have TKA dr Edna TBD date.  CV/AFib: Hx of ablation.  Afib and doing well.   On low dose eliquis   feels well since then  on low dose anticoagulation Thyroid   :  labs fu off amiodarone  is in range. HT  lower dose controlled  adjsuted by cards team  Hs abnormal lfts  neg liver bx HLD  on meds; crestor  ; zetia   No hx of cp sob syncope  bleeding  neuro events.    Health Maintenance  Topic Date Due   COVID-19 Vaccine (10 - 2025-26 season) 07/10/2024 (Originally 05/31/2024)   Medicare Annual Wellness (AWV)  03/11/2025   DTaP/Tdap/Td (4 - Td or Tdap) 12/11/2033   Pneumococcal Vaccine: 50+ Years  Completed   Influenza Vaccine  Completed   DEXA SCAN  Completed   Zoster Vaccines- Shingrix  Completed   HPV VACCINES  Aged Out   Meningococcal B Vaccine  Aged Out   Hepatitis B Vaccines 19-59 Average Risk  Discontinued   Colonoscopy  Discontinued   Hepatitis C Screening  Discontinued   To move to [place] area and plans to transfer care   evenetually to that location but wishes to get her tka and rehab done .   ROS:  GEN/ HEENT: No fever, significant weight changes sweats headaches vision problems hearing changes, CV/ PULM; No chest pain shortness of breath cough, syncope,edema  change in exercise tolerance. GI /GU: No adominal pain, vomiting, change in bowel habits. No blood in the stool. No significant GU symptoms. SKIN/HEME: ,no acute skin rashes suspicious lesions or bleeding. No lymphadenopathy, nodules, masses.  NEURO/ PSYCH:  No neurologic signs such as weakness numbness. No depression anxiety. IMM/ Allergy: No unusual infections.  Allergy .   REST of 12 system review negative except as per HPI   Past Medical History:  Diagnosis Date   A-fib (HCC)     Abnormal LFTs    nl liver bx positive antismooth muscle antibodies and neutropenia   Acute gastroenteritis 10/10/2023   Allergy 1957   Penicillin (hives)   Arrhythmia    Arthritis    left knee   Cataract 2020   Cataract surgery 2020   Diplopia 09/30/2000   eval neg   Hyperlipidemia    HDL over 100   Hypertension 2018 (?)   Not sure of exact date   Hypothyroidism    Kidney stones    in past   Leukopenia    with neg hemevaluation bm bx get wbc diff q 6 months   UTI (lower urinary tract infection)     Past Surgical History:  Procedure Laterality Date   ATRIAL FIBRILLATION ABLATION N/A 02/20/2024   Procedure: ATRIAL FIBRILLATION ABLATION;  Surgeon: Cindie Ole DASEN, MD;  Location: [facility];  Service: Cardiovascular;  Laterality: N/A;   BONE MARROW BIOPSY     low WBC   CATARACT EXTRACTION, BILATERAL     EYE SURGERY  Cataract surgery 2020   kidney stone removal     left arthroscopic  2005   left knee   LIVER BIOPSY     reported normal   TONSILLECTOMY     TUBAL LIGATION      Family History  Problem Relation Age of Onset  Other Father        major renal failure; SCCA in ear   Osteoarthritis Father    Heart disease Father    Hyperlipidemia Father    Hypertension Father    Hearing loss Father    Heart disease Mother        33   Heart attack Mother    Hyperlipidemia Mother    Hypertension Mother    Arthritis Mother    Autoimmune disease Sister    Autoimmune disease Brother    Stroke Maternal Grandmother    Heart attack Maternal Grandmother    Arthritis Maternal Grandmother    Varicose Veins Maternal Grandmother    Thyroid  disease Other    Stroke Other    Osteoporosis Other    Heart attack Paternal Grandmother    Arthritis Sister    Hearing loss Sister    Arthritis Brother    Hearing loss Brother     Social History   Socioeconomic History   Marital status: Married    Spouse name: Not on file   Number of children: Not on file   Years of  education: Not on file   Highest education level: Bachelor's degree (e.g., BA, AB, BS)  Occupational History   Not on file  Tobacco Use   Smoking status: Never   Smokeless tobacco: Never   Tobacco comments:    Never smoked 03/09/24  Vaping Use   Vaping status: Never Used  Substance and Sexual Activity   Alcohol use: Yes    Alcohol/week: 2.0 - 3.0 standard drinks of alcohol    Types: 2 - 3 Glasses of wine per week    Comment: 2 to 3 glasses a week    Drug use: No   Sexual activity: Yes  Other Topics Concern   Not on file  Social History Narrative   Married   Retired  2008   Regular exercise-yes   HH of 2   No pets   Social etoh   G2P2      Husband hac CABG 2012   Travel grandchildren    Social Drivers of Health   Financial Resource Strain: Low Risk  (06/23/2024)   Overall Financial Resource Strain (CARDIA)    Difficulty of Paying Living Expenses: Not hard at all  Food Insecurity: No Food Insecurity (06/23/2024)   Hunger Vital Sign    Worried About Running Out of Food in the Last Year: Never true    Ran Out of Food in the Last Year: Never true  Transportation Needs: No Transportation Needs (06/23/2024)   PRAPARE - Administrator, Civil Service (Medical): No    Lack of Transportation (Non-Medical): No  Physical Activity: Sufficiently Active (06/23/2024)   Exercise Vital Sign    Days of Exercise per Week: 7 days    Minutes of Exercise per Session: 30 min  Stress: No Stress Concern Present (06/23/2024)   [employer] of Occupational Health - Occupational Stress Questionnaire    Feeling of Stress: Not at all  Social Connections: Socially Integrated (06/23/2024)   Social Connection and Isolation Panel    Frequency of Communication with Friends and Family: More than three times a week    Frequency of Social Gatherings with Friends and Family: Three times a week    Attends Religious Services: More than 4 times per year    Active Member of Clubs or  Organizations: Yes    Attends Banker Meetings: More than 4 times per year  Marital Status: Married    Outpatient Medications Prior to Visit  Medication Sig Dispense Refill   acetaminophen  (TYLENOL ) 500 MG tablet Take 1,000 mg by mouth every 6 (six) hours as needed for moderate pain (pain score 4-6) or mild pain (pain score 1-3).     apixaban  (ELIQUIS ) 2.5 MG TABS tablet Take 1 tablet (2.5 mg total) by mouth 2 (two) times daily. 180 tablet 1   cholecalciferol (VITAMIN D3) 25 MCG (1000 UNIT) tablet Take 1,000 Units by mouth daily.     ezetimibe  (ZETIA ) 10 MG tablet Take 1 tablet (10 mg total) by mouth at bedtime.     levothyroxine  (SYNTHROID ) 88 MCG tablet TAKE 1 TABLET (88 MCG TOTAL) BY MOUTH IN THE MORNING 90 tablet 0   lisinopril  (ZESTRIL ) 5 MG tablet Take 1 tablet (5 mg total) by mouth daily. 90 tablet 3   metoprolol  tartrate (LOPRESSOR ) 25 MG tablet Take 1 tablet (25 mg total) by mouth 2 (two) times daily as needed. 180 tablet 3   rosuvastatin  (CRESTOR ) 10 MG tablet Take 1 tablet (10 mg total) by mouth at bedtime.     No facility-administered medications prior to visit.     EXAM:  BP 126/78 (BP Location: Left Arm, Patient Position: Sitting, Cuff Size: Normal)   Pulse 76   Temp 98.2 F (36.8 C) (Oral)   Ht 5' 4 (1.626 m)   Wt 121 lb 3.2 oz (55 kg)   SpO2 96%   BMI 20.80 kg/m   Body mass index is 20.8 kg/m. Wt Readings from Last 3 Encounters:  06/24/24 121 lb 3.2 oz (55 kg)  05/19/24 124 lb (56.2 kg)  03/09/24 124 lb (56.2 kg)    Physical Exam: Vital signs reviewed HZW:Uypd is a well-developed well-nourished alert cooperative    who appearsr stated age in no acute distress.  HEENT: normocephalic atraumatic , Eyes: PERRL EOM's full, conjunctiva clear, Nares: paten,t no deformity discharge or tenderness., Ears: no deformity EAC's clear TMs with normal landmarks. Mouth: clear OP, no lesions, edema.  Moist mucous membranes. Dentition in adequate repair. NECK:  supple without masses, thyromegaly or bruits. CHEST/PULM:  Clear to auscultation and percussion breath sounds equal no wheeze , rales or rhonchi. No chest wall deformities or tenderness. . CV: PMI is nondisplaced,  RR S1 S2 no gallops, murmurs, rubs. Peripheral pulses are present delay.No JVD .  ABDOMEN: Bowel sounds normal nontender  No guard or rebound, no hepato splenomegal no CVA tenderness. Extremtities:  No clubbing cyanosis or edema, no acute joint swelling or redness no focal atrophy hypertrophy left knee  NEURO:  Oriented x3, cranial nerves 3-12 appear to be intact, no obvious focal weakness,gait within normal limits no abnormal reflexes or asymmetrical SKIN: No acute rashes normal turgor, color, no bruising or petechiae. PSYCH: Oriented, good eye contact, no obvious depression anxiety, cognition and judgment appear normal. LN: no cervical  adenopathy  Lab Results  Component Value Date   WBC 3.8 03/09/2024   HGB 13.2 03/09/2024   HCT 39.9 03/09/2024   PLT 243 03/09/2024   GLUCOSE 89 03/09/2024   CHOL 134 10/11/2023   TRIG 117 10/11/2023   HDL 57 10/11/2023   LDLDIRECT 149.6 02/02/2013   LDLCALC 54 10/11/2023   ALT 34 06/24/2024   AST 35 06/24/2024   NA 140 03/09/2024   K 4.9 03/09/2024   CL 104 03/09/2024   CREATININE 0.69 03/09/2024   BUN 15 03/09/2024   CO2 27 03/09/2024   TSH 0.63 06/17/2024  INR 0.91 11/10/2009   HGBA1C 5.8 (H) 08/21/2023    BP Readings from Last 3 Encounters:  06/24/24 126/78  05/19/24 130/62  03/11/24 135/74    Lab record reviewed with patient   ASSESSMENT AND PLAN:  Discussed the following assessment and plan:    ICD-10-CM   1. Arthritis of knee  M17.10     2. Pre-operative clearance  Z01.818     3. Hypothyroidism, unspecified type  E03.9    levels now in range    4. Anticoagulant long-term use  Z79.01     5. Abnormal LFTs  R79.89 Hepatic Function Panel   stable  prev eval    6. Hyperlipidemia with target LDL less than 100   E78.5     7. Intermittent atrial fibrillation (HCC)  I48.0    as pre cards had ablation reported doing well.    Medically optimized and ok to proceed with  elective surgery  TKA as planned Cv appears stable and to have CV team opine for   preop . Fu lifts today because  routine fu  other lab can be done as needed by preop team.  Form completed and signed to be sent .  Return for when planned, as indicated.  Patient Care Team: Letasha Kershaw, Apolinar POUR, MD as PCP - General (Internal Medicine) Okey Vina GAILS, MD as PCP - Cardiology (Cardiology) Cindie Ole DASEN, MD as PCP - Electrophysiology (Cardiology) Jane Charleston, MD (Orthopedic Surgery) Lenon Faden, MD (Internal Medicine) Pyrtle, Gordy HERO, MD as Consulting Physician (Gastroenterology) Pyrtle, Gordy HERO, MD as Consulting Physician (Gastroenterology) Cleotilde Sewer, OD (Optometry) Hobart Powell BRAVO, MD (Inactive) as Consulting Physician (Cardiology) Liane Sharyne MATSU, [facility] (Inactive) as Pharmacist (Pharmacist) Patient Instructions  Good to see you today  Will get  form and note   to your surgeon  Cardiology pending   Christabel Camire K. Mkenzie Dotts M.D.

## 2024-06-25 ENCOUNTER — Ambulatory Visit: Payer: Self-pay | Admitting: Internal Medicine

## 2024-06-25 NOTE — Telephone Encounter (Signed)
 Patient with diagnosis of atrial fibrillation on Eliquis  for anticoagulation.    Procedure:  LEFT TOTAL KNEE ARTHROPLASTY   Date of Surgery:  Clearance TBD    CHA2DS2-VASc Score = 5   This indicates a 7.2% annual risk of stroke. The patient's score is based upon: CHF History: 0 HTN History: 1 Diabetes History: 0 Stroke History: 0 Vascular Disease History: 1 Age Score: 2 Gender Score: 1    CrCl 57 Platelet count 243  Patient has not had an Afib/aflutter ablation or Watchman within the last 3 months or DCCV within the last 30 days   Per office protocol, patient can hold Eliquis  for 3 days prior to procedure.   Patient will not need bridging with Lovenox (enoxaparin) around procedure.  **This guidance is not considered finalized until pre-operative APP has relayed final recommendations.**

## 2024-06-25 NOTE — Progress Notes (Signed)
Liver tests back to normal

## 2024-06-25 NOTE — Telephone Encounter (Signed)
   Patient Name: Crystal Odom  DOB: 08-03-1941 MRN: [id number]  Primary Cardiologist: Vina Gull, MD  Chart reviewed as part of pre-operative protocol coverage. Given past medical history and time since last visit, based on ACC/AHA guidelines, Crystal Odom is at acceptable risk for the planned procedure without further cardiovascular testing. Patient is low risk for surgical complications, 0.4 According to ACC/AHA guidelines, no further cardiovascular testing needed. The patient may proceed to surgery at acceptable risk.   Per Pharmacy 06/25/2024 Patient has not had an Afib/aflutter ablation or Watchman within the last 3 months or DCCV within the last 30 days    Per office protocol, patient can hold Eliquis  for 3 days prior to procedure.   Patient will not need bridging with Lovenox (enoxaparin) around procedure.  The patient was advised that if she develops new symptoms prior to surgery to contact our office to arrange for a follow-up visit, and she verbalized understanding.  I will route this recommendation to the requesting party via Epic fax function and remove from pre-op pool.  Please call with questions.  Lamarr Satterfield, NP 06/25/2024, 3:26 PM

## 2024-06-30 NOTE — Progress Notes (Signed)
   06/30/2024  Patient ID: Crystal Odom, female   DOB: August 15, 1941, 83 y.o.   MRN: [id number]  Pharmacy Quality Measure Review  This patient is appearing on a report for being at risk of failing the adherence measure for cholesterol (statin) medications this calendar year.   Medication: Rosuvastatin  10mg  Last fill date: 06/18/24 for 90 day supply  Insurance report was not up to date. No action needed at this time.    Jon VEAR Lindau, PharmD Clinical Pharmacist [phone]

## 2024-07-02 ENCOUNTER — Other Ambulatory Visit: Payer: Self-pay | Admitting: Internal Medicine

## 2024-07-07 DIAGNOSIS — H0014 Chalazion left upper eyelid: Secondary | ICD-10-CM | POA: Diagnosis not present

## 2024-07-28 ENCOUNTER — Ambulatory Visit: Admitting: Endocrinology

## 2024-08-10 DIAGNOSIS — M1712 Unilateral primary osteoarthritis, left knee: Secondary | ICD-10-CM | POA: Diagnosis not present

## 2024-08-10 NOTE — Progress Notes (Signed)
 Sent message, via epic in basket, requesting orders in epic from Careers adviser.

## 2024-08-13 NOTE — Patient Instructions (Addendum)
 SURGICAL WAITING ROOM VISITATION Patients having surgery or a procedure may have no more than 2 support people in the waiting area - these visitors may rotate in the visitor waiting room.   Due to an increase in RSV and influenza rates and associated hospitalizations, children ages 58 and under may not visit patients in [facility] hospitals. If the patient needs to stay at the hospital during part of their recovery, the visitor guidelines for inpatient rooms apply.  PRE-OP VISITATION  Pre-op nurse will coordinate an appropriate time for 1 support person to accompany the patient in pre-op.  This support person may not rotate.  This visitor will be contacted when the time is appropriate for the visitor to come back in the pre-op area.  Please refer to the [facility] website for the visitor guidelines for Inpatients (after your surgery is over and you are in a regular room).  You are not required to quarantine at this time prior to your surgery. However, you must do this: Hand Hygiene often Do NOT share personal items Notify your provider if you are in close contact with someone who has COVID or you develop fever 100.4 or greater, new onset of sneezing, cough, sore throat, shortness of breath or body aches.  If you test positive for Covid or have been in contact with anyone that has tested positive in the last 10 days please notify you surgeon.    Your procedure is scheduled on:  08/30/24  Report to [facility] Main Entrance: [place] entrance where the [employer] is available.   Report to admitting at: 5:15 AM  Call this number if you have any questions or problems the morning of surgery [phone]  FOLLOW ANY ADDITIONAL PRE OP INSTRUCTIONS YOU RECEIVED FROM YOUR SURGEON'S OFFICE!!!  Do not eat food after Midnight the night prior to your surgery/procedure.  After Midnight you may have the following liquids until : 4:15 AM DAY OF SURGERY  Clear Liquid Diet Water Black  Coffee (sugar ok, NO MILK/CREAM OR CREAMERS)  Tea (sugar ok, NO MILK/CREAM OR CREAMERS) regular and decaf                             Plain Jell-O  with no fruit (NO RED)                                           Fruit ices (not with fruit pulp, NO RED)                                     Popsicles (NO RED)                                                                  Juice: NO CITRUS JUICES: only apple, WHITE grape, WHITE cranberry Sports drinks like Gatorade or Powerade (NO RED)   The day of surgery:  Drink ONE (1) Pre-Surgery Clear Ensure at : 4:15 AM the morning of surgery. Drink in one sitting. Do not sip.  This drink was  given to you during your hospital pre-op appointment visit. Nothing else to drink after completing the Pre-Surgery Clear Ensure or G2 : No candy, chewing gum or throat lozenges.    Oral Hygiene is also important to reduce your risk of infection.        Remember - BRUSH YOUR TEETH THE MORNING OF SURGERY WITH YOUR REGULAR TOOTHPASTE  Do NOT smoke after Midnight the night before surgery.  STOP TAKING all Vitamins, Herbs and supplements 1 week before your surgery.   Take ONLY these medicines the morning of surgery with A SIP OF WATER: metoprolol ,levothyroxine .   Before surgery.Stop taking ___________on __________as instructed by _____________.  Stop taking ____________as directed by your Surgeon/Cardiologist.  Contact your Surgeon/Cardiologist for instructions on Anticoagulant Therapy prior to surgery.  If You have been diagnosed with Sleep Apnea - Bring CPAP mask and tubing day of surgery. We will provide you with a CPAP machine on the day of your surgery.                   You may not have any metal on your body including hair pins, jewelry, and body piercing  Do not wear make-up, lotions, powders, perfumes / cologne, or deodorant  Do not wear nail polish including gel and S&S, artificial / acrylic nails, or any other type of covering on natural nails  including finger and toenails. If you have artificial nails, gel coating, etc., that needs to be removed by a nail salon, Please have this removed prior to surgery. Not doing so may mean that your surgery could be cancelled or delayed if the Surgeon or anesthesia staff feels like they are unable to monitor you safely.   Do not shave 48 hours prior to surgery to avoid nicks in your skin which may contribute to postoperative infections.   Contacts, Hearing Aids, dentures or bridgework may not be worn into surgery. DENTURES WILL BE REMOVED PRIOR TO SURGERY PLEASE DO NOT APPLY Poly grip OR ADHESIVES!!!  You may bring a small overnight bag with you on the day of surgery, only pack items that are not valuable. [facility] IS NOT RESPONSIBLE   FOR VALUABLES THAT ARE LOST OR STOLEN.   Patients discharged on the day of surgery will not be allowed to drive home.  Someone NEEDS to stay with you for the first 24 hours after anesthesia.  Do not bring your home medications to the hospital. The Pharmacy will dispense medications listed on your medication list to you during your admission in the Hospital.  Special Instructions: Bring a copy of your healthcare power of attorney and living will documents the day of surgery, if you wish to have them scanned into your [facility]- EPIC  Please read over the following fact sheets you were given: IF YOU HAVE QUESTIONS ABOUT YOUR PRE-OP INSTRUCTIONS, PLEASE CALL [phone]  PATIENT SIGNATURE_________________________________  NURSE SIGNATURE__________________________________  ________________________________________________________________________  Pre-operative 4 CHG Bath Instructions  DYNA-Hex 4 Chlorhexidine Gluconate 4% Solution Antiseptic 4 fl. oz   You can play a key role in reducing the risk of infection after surgery. Your skin needs to be as free of germs as possible. You can reduce the number of germs on your skin by washing with CHG  (chlorhexidine gluconate) soap before surgery. CHG is an antiseptic soap that kills germs and continues to kill germs even after washing.   DO NOT use if you have an allergy to chlorhexidine/CHG or antibacterial soaps. If your skin becomes reddened or irritated,  stop using the CHG and notify one of our RNs at   Please shower with the CHG soap starting 4 days before surgery using the following schedule:     Please keep in mind the following:  DO NOT shave, including legs and underarms, starting the day of your first shower.   You may shave your face at any point before/day of surgery.  Place clean sheets on your bed the day you start using CHG soap. Use a clean washcloth (not used since being washed) for each shower. DO NOT sleep with pets once you start using the CHG.  CHG Shower Instructions:  If you choose to wash your hair and private area, wash first with your normal shampoo/soap.  After you use shampoo/soap, rinse your hair and body thoroughly to remove shampoo/soap residue.  Turn the water OFF and apply about 3 tablespoons (45 ml) of CHG soap to a CLEAN washcloth.  Apply CHG soap ONLY FROM YOUR NECK DOWN TO YOUR TOES (washing for 3-5 minutes)  DO NOT use CHG soap on face, private areas, open wounds, or sores.  Pay special attention to the area where your surgery is being performed.  If you are having back surgery, having someone wash your back for you may be helpful. Wait 2 minutes after CHG soap is applied, then you may rinse off the CHG soap.  Pat dry with a clean towel  Put on clean clothes/pajamas   If you choose to wear lotion, please use ONLY the CHG-compatible lotions on the back of this paper.     Additional instructions for the day of surgery: DO NOT APPLY any lotions, deodorants, cologne, or perfumes.   Put on clean/comfortable clothes.  Brush your teeth.  Ask your nurse before applying any prescription medications to the skin.   CHG Compatible Lotions   Aveeno  Moisturizing lotion  Cetaphil Moisturizing Cream  Cetaphil Moisturizing Lotion  Clairol Herbal Essence Moisturizing Lotion, Dry Skin  Clairol Herbal Essence Moisturizing Lotion, Extra Dry Skin  Clairol Herbal Essence Moisturizing Lotion, Normal Skin  Curel Age Defying Therapeutic Moisturizing Lotion with Alpha Hydroxy  Curel Extreme Care Body Lotion  Curel Soothing Hands Moisturizing Hand Lotion  Curel Therapeutic Moisturizing Cream, Fragrance-Free  Curel Therapeutic Moisturizing Lotion, Fragrance-Free  Curel Therapeutic Moisturizing Lotion, Original Formula  Eucerin Daily Replenishing Lotion  Eucerin Dry Skin Therapy Plus Alpha Hydroxy Crme  Eucerin Dry Skin Therapy Plus Alpha Hydroxy Lotion  Eucerin Original Crme  Eucerin Original Lotion  Eucerin Plus Crme Eucerin Plus Lotion  Eucerin TriLipid Replenishing Lotion  Keri Anti-Bacterial Hand Lotion  Keri Deep Conditioning Original Lotion Dry Skin Formula Softly Scented  Keri Deep Conditioning Original Lotion, Fragrance Free Sensitive Skin Formula  Keri Lotion Fast Absorbing Fragrance Free Sensitive Skin Formula  Keri Lotion Fast Absorbing Softly Scented Dry Skin Formula  Keri Original Lotion  Keri Skin Renewal Lotion Keri Silky Smooth Lotion  Keri Silky Smooth Sensitive Skin Lotion  Nivea Body Creamy Conditioning Oil  Nivea Body Extra Enriched Lotion  Nivea Body Original Lotion  Nivea Body Sheer Moisturizing Lotion Nivea Crme  Nivea Skin Firming Lotion  NutraDerm 30 Skin Lotion  NutraDerm Skin Lotion  NutraDerm Therapeutic Skin Cream  NutraDerm Therapeutic Skin Lotion  ProShield Protective Hand Cream  Provon moisturizing lotion  Incentive Spirometer  An incentive spirometer is a tool that can help keep your lungs clear and active. This tool measures how well you are filling your lungs with each breath. Taking long deep breaths may help  reverse or decrease the chance of developing breathing (pulmonary) problems (especially  infection) following: A long period of time when you are unable to move or be active. BEFORE THE PROCEDURE  If the spirometer includes an indicator to show your best effort, your nurse or respiratory therapist will set it to a desired goal. If possible, sit up straight or lean slightly forward. Try not to slouch. Hold the incentive spirometer in an upright position. INSTRUCTIONS FOR USE  Sit on the edge of your bed if possible, or sit up as far as you can in bed or on a chair. Hold the incentive spirometer in an upright position. Breathe out normally. Place the mouthpiece in your mouth and seal your lips tightly around it. Breathe in slowly and as deeply as possible, raising the piston or the ball toward the top of the column. Hold your breath for 3-5 seconds or for as long as possible. Allow the piston or ball to fall to the bottom of the column. Remove the mouthpiece from your mouth and breathe out normally. Rest for a few seconds and repeat Steps 1 through 7 at least 10 times every 1-2 hours when you are awake. Take your time and take a few normal breaths between deep breaths. The spirometer may include an indicator to show your best effort. Use the indicator as a goal to work toward during each repetition. After each set of 10 deep breaths, practice coughing to be sure your lungs are clear. If you have an incision (the cut made at the time of surgery), support your incision when coughing by placing a pillow or rolled up towels firmly against it. Once you are able to get out of bed, walk around indoors and cough well. You may stop using the incentive spirometer when instructed by your caregiver.  RISKS AND COMPLICATIONS Take your time so you do not get dizzy or light-headed. If you are in pain, you may need to take or ask for pain medication before doing incentive spirometry. It is harder to take a deep breath if you are having pain. AFTER USE Rest and breathe slowly and easily. It can be  helpful to keep track of a log of your progress. Your caregiver can provide you with a simple table to help with this. If you are using the spirometer at home, follow these instructions: SEEK MEDICAL CARE IF:  You are having difficultly using the spirometer. You have trouble using the spirometer as often as instructed. Your pain medication is not giving enough relief while using the spirometer. You develop fever of 100.5 F (38.1 C) or higher. SEEK IMMEDIATE MEDICAL CARE IF:  You cough up bloody sputum that had not been present before. You develop fever of 102 F (38.9 C) or greater. You develop worsening pain at or near the incision site. MAKE SURE YOU:  Understand these instructions. Will watch your condition. Will get help right away if you are not doing well or get worse. Document Released: 01/27/2007 Document Revised: 12/09/2011 Document Reviewed: 03/30/2007 [facility] Patient Information 2014 [place], [place].   ________________________________________________________________________

## 2024-08-17 ENCOUNTER — Encounter (HOSPITAL_COMMUNITY)
Admission: RE | Admit: 2024-08-17 | Discharge: 2024-08-17 | Disposition: A | Source: Ambulatory Visit | Attending: Orthopedic Surgery | Admitting: Orthopedic Surgery

## 2024-08-17 ENCOUNTER — Encounter (HOSPITAL_COMMUNITY): Payer: Self-pay

## 2024-08-17 ENCOUNTER — Ambulatory Visit: Payer: Self-pay | Admitting: Emergency Medicine

## 2024-08-17 ENCOUNTER — Other Ambulatory Visit: Payer: Self-pay

## 2024-08-17 VITALS — BP 157/85 | HR 68 | Temp 98.2°F | Ht 64.5 in | Wt 124.0 lb

## 2024-08-17 DIAGNOSIS — E039 Hypothyroidism, unspecified: Secondary | ICD-10-CM | POA: Insufficient documentation

## 2024-08-17 DIAGNOSIS — G8929 Other chronic pain: Secondary | ICD-10-CM | POA: Diagnosis not present

## 2024-08-17 DIAGNOSIS — I4891 Unspecified atrial fibrillation: Secondary | ICD-10-CM | POA: Diagnosis not present

## 2024-08-17 DIAGNOSIS — M25562 Pain in left knee: Secondary | ICD-10-CM | POA: Insufficient documentation

## 2024-08-17 DIAGNOSIS — Z01812 Encounter for preprocedural laboratory examination: Secondary | ICD-10-CM | POA: Diagnosis not present

## 2024-08-17 DIAGNOSIS — Z79890 Hormone replacement therapy (postmenopausal): Secondary | ICD-10-CM | POA: Diagnosis not present

## 2024-08-17 DIAGNOSIS — M1712 Unilateral primary osteoarthritis, left knee: Secondary | ICD-10-CM | POA: Insufficient documentation

## 2024-08-17 DIAGNOSIS — Z01818 Encounter for other preprocedural examination: Secondary | ICD-10-CM

## 2024-08-17 DIAGNOSIS — I350 Nonrheumatic aortic (valve) stenosis: Secondary | ICD-10-CM | POA: Diagnosis not present

## 2024-08-17 DIAGNOSIS — I10 Essential (primary) hypertension: Secondary | ICD-10-CM | POA: Insufficient documentation

## 2024-08-17 HISTORY — DX: Cardiac arrhythmia, unspecified: I49.9

## 2024-08-17 HISTORY — DX: Personal history of urinary calculi: Z87.442

## 2024-08-17 LAB — CBC WITH DIFFERENTIAL/PLATELET
Abs Immature Granulocytes: 0.01 K/uL (ref 0.00–0.07)
Basophils Absolute: 0.1 K/uL (ref 0.0–0.1)
Basophils Relative: 1 %
Eosinophils Absolute: 0.1 K/uL (ref 0.0–0.5)
Eosinophils Relative: 2 %
HCT: 39.6 % (ref 36.0–46.0)
Hemoglobin: 13 g/dL (ref 12.0–15.0)
Immature Granulocytes: 0 %
Lymphocytes Relative: 22 %
Lymphs Abs: 0.9 K/uL (ref 0.7–4.0)
MCH: 32 pg (ref 26.0–34.0)
MCHC: 32.8 g/dL (ref 30.0–36.0)
MCV: 97.5 fL (ref 80.0–100.0)
Monocytes Absolute: 0.6 K/uL (ref 0.1–1.0)
Monocytes Relative: 14 %
Neutro Abs: 2.5 K/uL (ref 1.7–7.7)
Neutrophils Relative %: 61 %
Platelets: 243 K/uL (ref 150–400)
RBC: 4.06 MIL/uL (ref 3.87–5.11)
RDW: 13.8 % (ref 11.5–15.5)
WBC: 4.1 K/uL (ref 4.0–10.5)
nRBC: 0 % (ref 0.0–0.2)

## 2024-08-17 LAB — COMPREHENSIVE METABOLIC PANEL WITH GFR
ALT: 34 U/L (ref 0–44)
AST: 40 U/L (ref 15–41)
Albumin: 4.1 g/dL (ref 3.5–5.0)
Alkaline Phosphatase: 81 U/L (ref 38–126)
Anion gap: 10 (ref 5–15)
BUN: 15 mg/dL (ref 8–23)
CO2: 26 mmol/L (ref 22–32)
Calcium: 9.5 mg/dL (ref 8.9–10.3)
Chloride: 104 mmol/L (ref 98–111)
Creatinine, Ser: 0.51 mg/dL (ref 0.44–1.00)
GFR, Estimated: >60 mL/min (ref >60)
Glucose, Bld: 86 mg/dL (ref 70–99)
Potassium: 3.9 mmol/L (ref 3.5–5.1)
Sodium: 139 mmol/L (ref 135–145)
Total Bilirubin: 0.6 mg/dL (ref 0.0–1.2)
Total Protein: 7.2 g/dL (ref 6.5–8.1)

## 2024-08-17 LAB — SURGICAL PCR SCREEN
MRSA, PCR: NEGATIVE
Staphylococcus aureus: NEGATIVE

## 2024-08-17 LAB — TYPE AND SCREEN
ABO/RH(D): A NEG
Antibody Screen: NEGATIVE

## 2024-08-17 NOTE — H&P (Signed)
 TOTAL KNEE ADMISSION H&P  Patient is being admitted for left total knee arthroplasty.  Subjective:  Chief Complaint:left knee pain.  HPI: Crystal Odom, 83 y.o. female, has a history of pain and functional disability in the left knee due to arthritis and has failed non-surgical conservative treatments for greater than 12 weeks to includeNSAID's and/or analgesics, corticosteriod injections, use of assistive devices, and activity modification.  Onset of symptoms was gradual, starting >10 years ago with gradually worsening course since that time. The patient noted prior procedures on the knee to include  arthroscopy on the left knee(s).  Patient currently rates pain in the left knee(s) at 9 out of 10 with activity. Patient has night pain, worsening of pain with activity and weight bearing, pain that interferes with activities of daily living, and pain with passive range of motion.  Patient has evidence of periarticular osteophytes and joint space narrowing by imaging studies. There is no active infection.  Patient Active Problem List   Diagnosis Date Noted   Aortic stenosis 11/03/2023   Essential hypertension 11/03/2023   Paroxysmal atrial fibrillation (HCC) 10/10/2023   Demand ischemia (HCC) 10/10/2023   Incidental pulmonary nodule, > 3mm and < 8mm 06/27/2016   Agatston coronary artery calcium  score less than 100  0 06/27/2016   Aortic valve calcification 06/27/2016   Medicare annual wellness visit, subsequent 05/02/2014   Personal history of kidney stones 01/09/2012   Abnormal LFTs    Leukopenia    Family history of autoimmune disorder 02/06/2011   UNSPECIFIED ARTHOPATHY, HAND 10/03/2009   Chest pain 12/05/2008   LIVER FUNCTION TESTS, ABNORMAL 10/31/2008   NEUTROPENIA UNSPECIFIED 08/10/2007   Disorder of bone and cartilage 08/10/2007   Hypothyroidism 06/16/2007   Hyperlipidemia with target LDL less than 100 06/16/2007   Past Medical History:  Diagnosis Date   A-fib (HCC)     Abnormal LFTs    nl liver bx positive antismooth muscle antibodies and neutropenia   Acute gastroenteritis 10/10/2023   Allergy 1957   Penicillin (hives)   Arrhythmia    Arthritis    left knee   Cataract 2020   Cataract surgery 2020   Diplopia 09/30/2000   eval neg   Hyperlipidemia    HDL over 100   Hypertension 2018 (?)   Not sure of exact date   Hypothyroidism    Kidney stones    in past   Leukopenia    with neg hemevaluation bm bx get wbc diff q 6 months   UTI (lower urinary tract infection)     Past Surgical History:  Procedure Laterality Date   ATRIAL FIBRILLATION ABLATION N/A 02/20/2024   Procedure: ATRIAL FIBRILLATION ABLATION;  Surgeon: Cindie Ole DASEN, MD;  Location: [facility] INVASIVE CV LAB;  Service: Cardiovascular;  Laterality: N/A;   BONE MARROW BIOPSY     low WBC   CATARACT EXTRACTION, BILATERAL     EYE SURGERY  Cataract surgery 2020   kidney stone removal     left arthroscopic  2005   left knee   LIVER BIOPSY     reported normal   TONSILLECTOMY     TUBAL LIGATION      Current Outpatient Medications  Medication Sig Dispense Refill Last Dose/Taking   acetaminophen  (TYLENOL ) 500 MG tablet Take 1,000 mg by mouth every 6 (six) hours as needed for moderate pain (pain score 4-6) or mild pain (pain score 1-3).      apixaban  (ELIQUIS ) 2.5 MG TABS tablet Take 1 tablet (2.5 mg total)  by mouth 2 (two) times daily. 180 tablet 1    cholecalciferol (VITAMIN D3) 25 MCG (1000 UNIT) tablet Take 1,000 Units by mouth daily.      ezetimibe  (ZETIA ) 10 MG tablet Take 1 tablet (10 mg total) by mouth at bedtime. (Patient taking differently: Take 10 mg by mouth in the morning.)      levothyroxine  (SYNTHROID ) 88 MCG tablet TAKE 1 TABLET (88 MCG TOTAL) BY MOUTH IN THE MORNING 90 tablet 0    lisinopril  (ZESTRIL ) 5 MG tablet Take 1 tablet (5 mg total) by mouth daily. 90 tablet 3    metoprolol  tartrate (LOPRESSOR ) 25 MG tablet Take 1 tablet (25 mg total) by mouth 2 (two) times daily as  needed. 180 tablet 3    rosuvastatin  (CRESTOR ) 10 MG tablet Take 1 tablet (10 mg total) by mouth at bedtime. (Patient taking differently: Take 10 mg by mouth every morning.)      SIMPLY SALINE NA Place 1 spray into the nose daily as needed.      No current facility-administered medications for this visit.   Allergies  Allergen Reactions   Penicillins Hives    Social History   Tobacco Use   Smoking status: Never   Smokeless tobacco: Never   Tobacco comments:    Never smoked 03/09/24  Substance Use Topics   Alcohol use: Yes    Alcohol/week: 2.0 - 3.0 standard drinks of alcohol    Types: 2 - 3 Glasses of wine per week    Comment: 2 to 3 glasses a week     Family History  Problem Relation Age of Onset   Other Father        major renal failure; SCCA in ear   Osteoarthritis Father    Heart disease Father    Hyperlipidemia Father    Hypertension Father    Hearing loss Father    Heart disease Mother        56   Heart attack Mother    Hyperlipidemia Mother    Hypertension Mother    Arthritis Mother    Autoimmune disease Sister    Autoimmune disease Brother    Stroke Maternal Grandmother    Heart attack Maternal Grandmother    Arthritis Maternal Grandmother    Varicose Veins Maternal Grandmother    Thyroid  disease Other    Stroke Other    Osteoporosis Other    Heart attack Paternal Grandmother    Arthritis Sister    Hearing loss Sister    Arthritis Brother    Hearing loss Brother      Review of Systems  Musculoskeletal:  Positive for arthralgias.  All other systems reviewed and are negative.   Objective:  Physical Exam Constitutional:      General: She is not in acute distress.    Appearance: Normal appearance. She is not ill-appearing.  HENT:     Head: Normocephalic and atraumatic.     Right Ear: External ear normal.     Left Ear: External ear normal.     Nose: Nose normal.     Mouth/Throat:     Mouth: Mucous membranes are moist.     Pharynx: Oropharynx  is clear.  Eyes:     Extraocular Movements: Extraocular movements intact.     Conjunctiva/sclera: Conjunctivae normal.  Cardiovascular:     Rate and Rhythm: Normal rate.     Pulses: Normal pulses.  Pulmonary:     Effort: Pulmonary effort is normal.  Abdominal:  General: Bowel sounds are normal.     Palpations: Abdomen is soft.  Musculoskeletal:        General: Tenderness present.     Cervical back: Normal range of motion and neck supple.     Comments: TTP over medial and lateral joint line, medial worse than lateral.  No calf tenderness, swelling, or erythema.  No overlying lesions of area of chief complaint.  Decreased strength and ROM due to elicited pain.   Dorsiflexion and plantarflexion intact.  Stable to varus and valgus stress.  BLE appear grossly neurovascularly intact.  Gait mildly antalgic.   Skin:    General: Skin is warm and dry.  Neurological:     Mental Status: She is alert and oriented to person, place, and time. Mental status is at baseline.  Psychiatric:        Mood and Affect: Mood normal.        Behavior: Behavior normal.     Vital signs in last 24 hours: @VSRANGES @  Labs:   Estimated body mass index is 20.8 kg/m as calculated from the following:   Height as of 06/24/24: 5' 4 (1.626 m).   Weight as of 06/24/24: 55 kg.   Imaging Review Plain radiographs demonstrate severe degenerative joint disease of the left knee(s). The overall alignment isgenerally neutral with slight varus. The bone quality appears to be fair for age and reported activity level.      Assessment/Plan:  End stage arthritis, left knee   The patient history, physical examination, clinical judgment of the provider and imaging studies are consistent with end stage degenerative joint disease of the left knee(s) and total knee arthroplasty is deemed medically necessary. The treatment options including medical management, injection therapy arthroscopy and arthroplasty were discussed  at length. The risks and benefits of total knee arthroplasty were presented and reviewed. The risks due to aseptic loosening, infection, stiffness, patella tracking problems, thromboembolic complications and other imponderables were discussed. The patient acknowledged the explanation, agreed to proceed with the plan and consent was signed. Patient is being admitted for inpatient treatment for surgery, pain control, PT, OT, prophylactic antibiotics, VTE prophylaxis, progressive ambulation and ADL's and discharge planning. The patient is planning to be discharged home with OPPT    Anticipated LOS equal to or greater than 2 midnights due to - Age 22 and older with one or more of the following:  - Obesity  - Expected need for hospital services (PT, OT, Nursing) required for safe  discharge  - Anticipated need for postoperative skilled nursing care or inpatient rehab  - Active co-morbidities: HTN, hx of Afib, hx kidney stones, aortic stenosis, HLD, hypothyroidism OR   - Unanticipated findings during/Post Surgery: None  - Patient is a high risk of re-admission due to: None

## 2024-08-17 NOTE — Progress Notes (Signed)
 For Anesthesia: PCP - Charlett Apolinar POUR, MD LOV: 06/24/24. Cardiologist - Okey Vina GAILS, MD  Clearance: Jerilynn Lamarr HERO, NP :: 06/25/24 Bowel Prep reminder:N/A  Chest x-ray - 10/10/23. CT cardiac: 02/14/24 EKG - 05/19/24 Stress Test -  ECHO - 09/10/23 Cardiac Cath -  Pacemaker/ICD device last checked: Pacemaker orders received: Device Rep notified:  Spinal Cord Stimulator:N/A  Sleep Study - N/A CPAP -  N/A Fasting Blood Sugar - N/A Checks Blood Sugar _____ times a day Date and result of last Hgb A1c-  Last dose of GLP1 agonist- N/A GLP1 instructions: Hold 7 days prior to schedule (Hold 24 hours-daily)   Last dose of SGLT-2 inhibitors- N/A SGLT-2 instructions: Hold 72 hours prior to surgery  Blood Thinner Instructions: Eliquis .To hold after: 08/26/24 Last Dose: Time last taken:  Aspirin Instructions:N/A Last Dose: Time last taken:  Activity level: Can go up a flight of stairs and activities of daily living without stopping and without chest pain and/or shortness of breath   Able to exercise without chest pain and/or shortness of breath  Anesthesia review: Hx: HTN,Afib,Ablatio: 02/20/24  Patient denies shortness of breath, fever, cough and chest pain at PAT appointment   Patient verbalized understanding of instructions that were reviewed over the telephone.

## 2024-08-17 NOTE — H&P (View-Only) (Signed)
 TOTAL KNEE ADMISSION H&P  Patient is being admitted for left total knee arthroplasty.  Subjective:  Chief Complaint:left knee pain.  HPI: Crystal Odom, 83 y.o. female, has a history of pain and functional disability in the left knee due to arthritis and has failed non-surgical conservative treatments for greater than 12 weeks to includeNSAID's and/or analgesics, corticosteriod injections, use of assistive devices, and activity modification.  Onset of symptoms was gradual, starting >10 years ago with gradually worsening course since that time. The patient noted prior procedures on the knee to include  arthroscopy on the left knee(s).  Patient currently rates pain in the left knee(s) at 9 out of 10 with activity. Patient has night pain, worsening of pain with activity and weight bearing, pain that interferes with activities of daily living, and pain with passive range of motion.  Patient has evidence of periarticular osteophytes and joint space narrowing by imaging studies. There is no active infection.  Patient Active Problem List   Diagnosis Date Noted   Aortic stenosis 11/03/2023   Essential hypertension 11/03/2023   Paroxysmal atrial fibrillation (HCC) 10/10/2023   Demand ischemia (HCC) 10/10/2023   Incidental pulmonary nodule, > 3mm and < 8mm 06/27/2016   Agatston coronary artery calcium  score less than 100  0 06/27/2016   Aortic valve calcification 06/27/2016   Medicare annual wellness visit, subsequent 05/02/2014   Personal history of kidney stones 01/09/2012   Abnormal LFTs    Leukopenia    Family history of autoimmune disorder 02/06/2011   UNSPECIFIED ARTHOPATHY, HAND 10/03/2009   Chest pain 12/05/2008   LIVER FUNCTION TESTS, ABNORMAL 10/31/2008   NEUTROPENIA UNSPECIFIED 08/10/2007   Disorder of bone and cartilage 08/10/2007   Hypothyroidism 06/16/2007   Hyperlipidemia with target LDL less than 100 06/16/2007   Past Medical History:  Diagnosis Date   A-fib (HCC)     Abnormal LFTs    nl liver bx positive antismooth muscle antibodies and neutropenia   Acute gastroenteritis 10/10/2023   Allergy 1957   Penicillin (hives)   Arrhythmia    Arthritis    left knee   Cataract 2020   Cataract surgery 2020   Diplopia 09/30/2000   eval neg   Hyperlipidemia    HDL over 100   Hypertension 2018 (?)   Not sure of exact date   Hypothyroidism    Kidney stones    in past   Leukopenia    with neg hemevaluation bm bx get wbc diff q 6 months   UTI (lower urinary tract infection)     Past Surgical History:  Procedure Laterality Date   ATRIAL FIBRILLATION ABLATION N/A 02/20/2024   Procedure: ATRIAL FIBRILLATION ABLATION;  Surgeon: Cindie Ole DASEN, MD;  Location: [facility];  Service: Cardiovascular;  Laterality: N/A;   BONE MARROW BIOPSY     low WBC   CATARACT EXTRACTION, BILATERAL     EYE SURGERY  Cataract surgery 2020   kidney stone removal     left arthroscopic  2005   left knee   LIVER BIOPSY     reported normal   TONSILLECTOMY     TUBAL LIGATION      Current Outpatient Medications  Medication Sig Dispense Refill Last Dose/Taking   acetaminophen  (TYLENOL ) 500 MG tablet Take 1,000 mg by mouth every 6 (six) hours as needed for moderate pain (pain score 4-6) or mild pain (pain score 1-3).      apixaban  (ELIQUIS ) 2.5 MG TABS tablet Take 1 tablet (2.5 mg total)  by mouth 2 (two) times daily. 180 tablet 1    cholecalciferol (VITAMIN D3) 25 MCG (1000 UNIT) tablet Take 1,000 Units by mouth daily.      ezetimibe  (ZETIA ) 10 MG tablet Take 1 tablet (10 mg total) by mouth at bedtime. (Patient taking differently: Take 10 mg by mouth in the morning.)      levothyroxine  (SYNTHROID ) 88 MCG tablet TAKE 1 TABLET (88 MCG TOTAL) BY MOUTH IN THE MORNING 90 tablet 0    lisinopril  (ZESTRIL ) 5 MG tablet Take 1 tablet (5 mg total) by mouth daily. 90 tablet 3    metoprolol  tartrate (LOPRESSOR ) 25 MG tablet Take 1 tablet (25 mg total) by mouth 2 (two) times daily as  needed. 180 tablet 3    rosuvastatin  (CRESTOR ) 10 MG tablet Take 1 tablet (10 mg total) by mouth at bedtime. (Patient taking differently: Take 10 mg by mouth every morning.)      SIMPLY SALINE NA Place 1 spray into the nose daily as needed.      No current facility-administered medications for this visit.   Allergies  Allergen Reactions   Penicillins Hives    Social History   Tobacco Use   Smoking status: Never   Smokeless tobacco: Never   Tobacco comments:    Never smoked 03/09/24  Substance Use Topics   Alcohol use: Yes    Alcohol/week: 2.0 - 3.0 standard drinks of alcohol    Types: 2 - 3 Glasses of wine per week    Comment: 2 to 3 glasses a week     Family History  Problem Relation Age of Onset   Other Father        major renal failure; SCCA in ear   Osteoarthritis Father    Heart disease Father    Hyperlipidemia Father    Hypertension Father    Hearing loss Father    Heart disease Mother        56   Heart attack Mother    Hyperlipidemia Mother    Hypertension Mother    Arthritis Mother    Autoimmune disease Sister    Autoimmune disease Brother    Stroke Maternal Grandmother    Heart attack Maternal Grandmother    Arthritis Maternal Grandmother    Varicose Veins Maternal Grandmother    Thyroid  disease Other    Stroke Other    Osteoporosis Other    Heart attack Paternal Grandmother    Arthritis Sister    Hearing loss Sister    Arthritis Brother    Hearing loss Brother      Review of Systems  Musculoskeletal:  Positive for arthralgias.  All other systems reviewed and are negative.   Objective:  Physical Exam Constitutional:      General: She is not in acute distress.    Appearance: Normal appearance. She is not ill-appearing.  HENT:     Head: Normocephalic and atraumatic.     Right Ear: External ear normal.     Left Ear: External ear normal.     Nose: Nose normal.     Mouth/Throat:     Mouth: Mucous membranes are moist.     Pharynx: Oropharynx  is clear.  Eyes:     Extraocular Movements: Extraocular movements intact.     Conjunctiva/sclera: Conjunctivae normal.  Cardiovascular:     Rate and Rhythm: Normal rate.     Pulses: Normal pulses.  Pulmonary:     Effort: Pulmonary effort is normal.  Abdominal:  General: Bowel sounds are normal.     Palpations: Abdomen is soft.  Musculoskeletal:        General: Tenderness present.     Cervical back: Normal range of motion and neck supple.     Comments: TTP over medial and lateral joint line, medial worse than lateral.  No calf tenderness, swelling, or erythema.  No overlying lesions of area of chief complaint.  Decreased strength and ROM due to elicited pain.   Dorsiflexion and plantarflexion intact.  Stable to varus and valgus stress.  BLE appear grossly neurovascularly intact.  Gait mildly antalgic.   Skin:    General: Skin is warm and dry.  Neurological:     Mental Status: She is alert and oriented to person, place, and time. Mental status is at baseline.  Psychiatric:        Mood and Affect: Mood normal.        Behavior: Behavior normal.     Vital signs in last 24 hours: @VSRANGES @  Labs:   Estimated body mass index is 20.8 kg/m as calculated from the following:   Height as of 06/24/24: 5' 4 (1.626 m).   Weight as of 06/24/24: 55 kg.   Imaging Review Plain radiographs demonstrate severe degenerative joint disease of the left knee(s). The overall alignment isgenerally neutral with slight varus. The bone quality appears to be fair for age and reported activity level.      Assessment/Plan:  End stage arthritis, left knee   The patient history, physical examination, clinical judgment of the provider and imaging studies are consistent with end stage degenerative joint disease of the left knee(s) and total knee arthroplasty is deemed medically necessary. The treatment options including medical management, injection therapy arthroscopy and arthroplasty were discussed  at length. The risks and benefits of total knee arthroplasty were presented and reviewed. The risks due to aseptic loosening, infection, stiffness, patella tracking problems, thromboembolic complications and other imponderables were discussed. The patient acknowledged the explanation, agreed to proceed with the plan and consent was signed. Patient is being admitted for inpatient treatment for surgery, pain control, PT, OT, prophylactic antibiotics, VTE prophylaxis, progressive ambulation and ADL's and discharge planning. The patient is planning to be discharged home with OPPT    Anticipated LOS equal to or greater than 2 midnights due to - Age 22 and older with one or more of the following:  - Obesity  - Expected need for hospital services (PT, OT, Nursing) required for safe  discharge  - Anticipated need for postoperative skilled nursing care or inpatient rehab  - Active co-morbidities: HTN, hx of Afib, hx kidney stones, aortic stenosis, HLD, hypothyroidism OR   - Unanticipated findings during/Post Surgery: None  - Patient is a high risk of re-admission due to: None

## 2024-08-18 NOTE — Anesthesia Preprocedure Evaluation (Addendum)
 Anesthesia Evaluation  Patient identified by MRN, date of birth, ID band Patient awake    Reviewed: Allergy & Precautions, NPO status , Patient's Chart, lab work & pertinent test results, reviewed documented beta blocker date and time   History of Anesthesia Complications Negative for: history of anesthetic complications  Airway Mallampati: II  TM Distance: >3 FB     Dental no notable dental hx.    Pulmonary neg COPD   breath sounds clear to auscultation       Cardiovascular hypertension, (-) CAD, (-) Past MI and (-) Cardiac Stents + dysrhythmias Atrial Fibrillation  Rhythm:Regular Rate:Normal  IMPRESSIONS     1. Left ventricular ejection fraction, by estimation, is 60 to 65%. The  left ventricle has normal function. The left ventricle has no regional  wall motion abnormalities. Left ventricular diastolic parameters are  consistent with Grade I diastolic  dysfunction (impaired relaxation).   2. Right ventricular systolic function is normal. The right ventricular  size is normal. There is normal pulmonary artery systolic pressure. The  estimated right ventricular systolic pressure is 31.7 mmHg.   3. Left atrial size was mildly dilated.   4. The mitral valve is normal in structure. Mild mitral valve  regurgitation. No evidence of mitral stenosis.   5. The aortic valve is tricuspid. There is moderate calcification of the  aortic valve. Aortic valve regurgitation is not visualized. Mild aortic  valve stenosis. Aortic valve mean gradient measures 10.0 mmHg.   6. The inferior vena cava is normal in size with greater than 50%  respiratory variability, suggesting right atrial pressure of 3 mmHg.     Neuro/Psych neg Seizures    GI/Hepatic ,,,(+) neg Cirrhosis        Endo/Other  Hypothyroidism    Renal/GU Renal disease     Musculoskeletal   Abdominal   Peds  Hematology   Anesthesia Other Findings    Reproductive/Obstetrics                              Anesthesia Physical Anesthesia Plan  ASA: 2  Anesthesia Plan: Spinal   Post-op Pain Management: Regional block*   Induction: Intravenous  PONV Risk Score and Plan: 2 and Ondansetron  and Propofol  infusion  Airway Management Planned: Natural Airway and Simple Face Mask  Additional Equipment:   Intra-op Plan:   Post-operative Plan:   Informed Consent: I have reviewed the patients History and Physical, chart, labs and discussed the procedure including the risks, benefits and alternatives for the proposed anesthesia with the patient or authorized representative who has indicated his/her understanding and acceptance.     Dental advisory given  Plan Discussed with: CRNA  Anesthesia Plan Comments: (See PAT note 08/17/2024)         Anesthesia Quick Evaluation

## 2024-08-18 NOTE — Progress Notes (Signed)
 Anesthesia Chart Review   Case: [id number] Date/Time: 08/30/24 0700   Procedure: ARTHROPLASTY, KNEE, TOTAL (Left: Knee)   Anesthesia type: Spinal   Pre-op diagnosis: OA LEFT KNEE   Location: [facility]OM 08 / [facility]   Surgeons: Edna Toribio LABOR, MD       DISCUSSION:83 y.o. never smoker with h/o HTN, hypothyroidism on synthroid , a-fib s/p ablation 02/20/2024, mild aortic valve stenosis on 08/2023 Echo, left knee OA scheduled for above procedure 08/30/2024 with Dr. Toribio Edna.   Pt seen by cardiology 06/24/2024.  Per OV note, Medically optimized and ok to proceed with elective surgery TKA as planned  Per cardiology preoperative evaluation 06/25/2024, Chart reviewed as part of pre-operative protocol coverage. Given past medical history and time since last visit, based on ACC/AHA guidelines, Crystal Odom is at acceptable risk for the planned procedure without further cardiovascular testing. Patient is low risk for surgical complications, 0.4 According to ACC/AHA guidelines, no further cardiovascular testing needed. The patient may proceed to surgery at acceptable risk.    Per Pharmacy 06/25/2024 Patient has not had an Afib/aflutter ablation or Watchman within the last 3 months or DCCV within the last 30 days    Per office protocol, patient can hold Eliquis  for 3 days prior to procedure.   Patient will not need bridging with Lovenox (enoxaparin) around procedure.  Pt reports last dose of Eliquis  08/26/24.   VS: BP (!) 157/85   Pulse 68   Temp 36.8 C (Oral)   Ht 5' 4.5 (1.638 m)   Wt 56.2 kg   SpO2 98%   BMI 20.96 kg/m   PROVIDERS: Panosh, Apolinar POUR, MD is PCP   Cardiologist - Okey Vina GAILS, MD  LABS: Labs reviewed: Acceptable for surgery. (all labs ordered are listed, but only abnormal results are displayed)  Labs Reviewed  SURGICAL PCR SCREEN  CBC WITH DIFFERENTIAL/PLATELET  COMPREHENSIVE METABOLIC PANEL WITH GFR  TYPE AND SCREEN      IMAGES:   EKG:   CV: Echo 09/10/2023 1. Left ventricular ejection fraction, by estimation, is 60 to 65%. The  left ventricle has normal function. The left ventricle has no regional  wall motion abnormalities. Left ventricular diastolic parameters are  consistent with Grade I diastolic  dysfunction (impaired relaxation).   2. Right ventricular systolic function is normal. The right ventricular  size is normal. There is normal pulmonary artery systolic pressure. The  estimated right ventricular systolic pressure is 31.7 mmHg.   3. Left atrial size was mildly dilated.   4. The mitral valve is normal in structure. Mild mitral valve  regurgitation. No evidence of mitral stenosis.   5. The aortic valve is tricuspid. There is moderate calcification of the  aortic valve. Aortic valve regurgitation is not visualized. Mild aortic  valve stenosis. Aortic valve mean gradient measures 10.0 mmHg.   6. The inferior vena cava is normal in size with greater than 50%  respiratory variability, suggesting right atrial pressure of 3 mmHg.  Past Medical History:  Diagnosis Date   A-fib (HCC)    Abnormal LFTs    nl liver bx positive antismooth muscle antibodies and neutropenia   Acute gastroenteritis 10/10/2023   Allergy 1957   Penicillin (hives)   Arrhythmia    Arthritis    left knee   Cataract 2020   Cataract surgery 2020   Diplopia 09/30/2000   eval neg   Dysrhythmia    History of kidney stones    Hyperlipidemia    HDL over  100   Hypertension 2018 (?)   Not sure of exact date   Hypothyroidism    Leukopenia    with neg hemevaluation bm bx get wbc diff q 6 months   UTI (lower urinary tract infection)     Past Surgical History:  Procedure Laterality Date   ATRIAL FIBRILLATION ABLATION N/A 02/20/2024   Procedure: ATRIAL FIBRILLATION ABLATION;  Surgeon: Cindie Ole DASEN, MD;  Location: [facility];  Service: Cardiovascular;  Laterality: N/A;   BONE MARROW BIOPSY      low WBC   CATARACT EXTRACTION, BILATERAL     EYE SURGERY  Cataract surgery 2020   kidney stone removal     left arthroscopic  2005   left knee   LIVER BIOPSY     reported normal   TONSILLECTOMY     TUBAL LIGATION      MEDICATIONS:  acetaminophen  (TYLENOL ) 500 MG tablet   apixaban  (ELIQUIS ) 2.5 MG TABS tablet   cholecalciferol (VITAMIN D3) 25 MCG (1000 UNIT) tablet   ezetimibe  (ZETIA ) 10 MG tablet   levothyroxine  (SYNTHROID ) 88 MCG tablet   lisinopril  (ZESTRIL ) 5 MG tablet   metoprolol  tartrate (LOPRESSOR ) 25 MG tablet   rosuvastatin  (CRESTOR ) 10 MG tablet   SIMPLY SALINE NA   No current facility-administered medications for this encounter.   Harlene Hoots Ward, PA-C [facility] [phone]

## 2024-08-24 NOTE — Care Plan (Signed)
 Ortho Bundle Case Management Note  Patient Details  Name: Crystal Odom MRN: [id number] Date of Birth: 01-02-1941   met with patient and husband in the office for H&P. will discharge to home with family to assist. They have recently moved to [facility] in [place]. all therapy will be there. discharge instructions discussed and questions answered.  Patient and MD in agreement with plan. Choice offered.     DME Arranged:  Vannie rolling DME Agency:  [employer]  HH Arranged:    HH Agency:     Additional Comments: Please contact me with any questions of if this plan should need to change.  Charlies Pitch,  RN,BSN,MHA,CCM  [facility]  [phone] 08/24/2024, 8:02 AM

## 2024-08-30 ENCOUNTER — Observation Stay (HOSPITAL_COMMUNITY)

## 2024-08-30 ENCOUNTER — Observation Stay (HOSPITAL_COMMUNITY)
Admission: RE | Admit: 2024-08-30 | Discharge: 2024-08-31 | Disposition: A | Source: Ambulatory Visit | Attending: Orthopedic Surgery | Admitting: Orthopedic Surgery

## 2024-08-30 ENCOUNTER — Ambulatory Visit (HOSPITAL_COMMUNITY): Admitting: Certified Registered Nurse Anesthetist

## 2024-08-30 ENCOUNTER — Other Ambulatory Visit: Payer: Self-pay

## 2024-08-30 ENCOUNTER — Encounter (HOSPITAL_COMMUNITY): Payer: Self-pay | Admitting: Orthopedic Surgery

## 2024-08-30 ENCOUNTER — Ambulatory Visit (HOSPITAL_COMMUNITY): Payer: Self-pay | Admitting: Physician Assistant

## 2024-08-30 ENCOUNTER — Encounter (HOSPITAL_COMMUNITY): Admission: RE | Disposition: A | Payer: Self-pay | Source: Ambulatory Visit | Attending: Orthopedic Surgery

## 2024-08-30 DIAGNOSIS — R609 Edema, unspecified: Secondary | ICD-10-CM | POA: Diagnosis not present

## 2024-08-30 DIAGNOSIS — Z79899 Other long term (current) drug therapy: Secondary | ICD-10-CM | POA: Diagnosis not present

## 2024-08-30 DIAGNOSIS — M1712 Unilateral primary osteoarthritis, left knee: Principal | ICD-10-CM | POA: Diagnosis present

## 2024-08-30 DIAGNOSIS — G8918 Other acute postprocedural pain: Secondary | ICD-10-CM | POA: Diagnosis not present

## 2024-08-30 DIAGNOSIS — E039 Hypothyroidism, unspecified: Secondary | ICD-10-CM | POA: Diagnosis not present

## 2024-08-30 DIAGNOSIS — F1090 Alcohol use, unspecified, uncomplicated: Secondary | ICD-10-CM | POA: Insufficient documentation

## 2024-08-30 DIAGNOSIS — Z471 Aftercare following joint replacement surgery: Secondary | ICD-10-CM | POA: Diagnosis not present

## 2024-08-30 DIAGNOSIS — Z96652 Presence of left artificial knee joint: Secondary | ICD-10-CM | POA: Diagnosis not present

## 2024-08-30 DIAGNOSIS — I10 Essential (primary) hypertension: Secondary | ICD-10-CM | POA: Insufficient documentation

## 2024-08-30 LAB — ABO/RH: ABO/RH(D): A NEG

## 2024-08-30 SURGERY — ARTHROPLASTY, KNEE, TOTAL
Anesthesia: Spinal | Site: Knee | Laterality: Left

## 2024-08-30 MED ORDER — ISOPROPYL ALCOHOL 70 % SOLN
Status: DC | PRN
  Administered 2024-08-30: 1 via TOPICAL

## 2024-08-30 MED ORDER — BUPIVACAINE LIPOSOME 1.3 % IJ SUSP
INTRAMUSCULAR | Status: AC
  Filled 2024-08-30: qty 20

## 2024-08-30 MED ORDER — ACETAMINOPHEN 325 MG PO TABS: 325.0000 mg | ORAL_TABLET | Freq: Four times a day (QID) | ORAL | Status: DC | PRN

## 2024-08-30 MED ORDER — APIXABAN 2.5 MG PO TABS
2.5000 mg | ORAL_TABLET | Freq: Two times a day (BID) | ORAL | Status: DC
  Administered 2024-08-31: 2.5 mg via ORAL
  Filled 2024-08-30: qty 1

## 2024-08-30 MED ORDER — OXYCODONE HCL 5 MG PO TABS
5.0000 mg | ORAL_TABLET | ORAL | Status: DC | PRN
  Administered 2024-08-30: 5 mg via ORAL
  Administered 2024-08-31: 10 mg via ORAL
  Administered 2024-08-31: 5 mg via ORAL
  Filled 2024-08-30 (×3): qty 2
  Filled 2024-08-30 (×3): qty 1

## 2024-08-30 MED ORDER — SODIUM CHLORIDE 0.9 % IR SOLN
Status: DC | PRN
  Administered 2024-08-30: 3000 mL

## 2024-08-30 MED ORDER — LACTATED RINGERS IV SOLN: INTRAVENOUS | Status: DC

## 2024-08-30 MED ORDER — ONDANSETRON HCL 4 MG/2ML IJ SOLN
INTRAMUSCULAR | Status: DC | PRN
  Administered 2024-08-30: 4 mg via INTRAVENOUS

## 2024-08-30 MED ORDER — SODIUM CHLORIDE (PF) 0.9 % IJ SOLN
INTRAMUSCULAR | Status: AC
  Filled 2024-08-30: qty 50

## 2024-08-30 MED ORDER — ONDANSETRON HCL 4 MG/2ML IJ SOLN
INTRAMUSCULAR | Status: AC
  Filled 2024-08-30: qty 2

## 2024-08-30 MED ORDER — TRANEXAMIC ACID-NACL 1000-0.7 MG/100ML-% IV SOLN
1000.0000 mg | INTRAVENOUS | Status: AC
  Administered 2024-08-30: 1000 mg via INTRAVENOUS
  Filled 2024-08-30: qty 100

## 2024-08-30 MED ORDER — PHENYLEPHRINE HCL-NACL 20-0.9 MG/250ML-% IV SOLN
INTRAVENOUS | Status: DC | PRN
  Administered 2024-08-30: 35 ug/min via INTRAVENOUS

## 2024-08-30 MED ORDER — PROPOFOL 1000 MG/100ML IV EMUL
INTRAVENOUS | Status: AC
  Filled 2024-08-30: qty 100

## 2024-08-30 MED ORDER — ROSUVASTATIN CALCIUM 10 MG PO TABS
10.0000 mg | ORAL_TABLET | Freq: Every morning | ORAL | Status: DC
  Administered 2024-08-31: 10 mg via ORAL
  Filled 2024-08-30: qty 1

## 2024-08-30 MED ORDER — DEXAMETHASONE SOD PHOSPHATE PF 10 MG/ML IJ SOLN
8.0000 mg | Freq: Once | INTRAMUSCULAR | Status: AC
  Administered 2024-08-30: 8 mg via INTRAVENOUS

## 2024-08-30 MED ORDER — SODIUM CHLORIDE 0.9 % IV SOLN: INTRAVENOUS | Status: DC

## 2024-08-30 MED ORDER — ACETAMINOPHEN 500 MG PO TABS
1000.0000 mg | ORAL_TABLET | Freq: Once | ORAL | Status: AC
  Administered 2024-08-30: 1000 mg via ORAL
  Filled 2024-08-30: qty 2

## 2024-08-30 MED ORDER — MEPIVACAINE HCL (PF) 2 % IJ SOLN
INTRAMUSCULAR | Status: DC | PRN
  Administered 2024-08-30: 3 mL via INTRATHECAL

## 2024-08-30 MED ORDER — METOPROLOL TARTRATE 25 MG PO TABS: 25.0000 mg | ORAL_TABLET | Freq: Two times a day (BID) | ORAL | Status: DC | PRN

## 2024-08-30 MED ORDER — WATER FOR IRRIGATION, STERILE IR SOLN
Status: DC | PRN
  Administered 2024-08-30: 2000 mL

## 2024-08-30 MED ORDER — CELECOXIB 100 MG PO CAPS: 100.0000 mg | ORAL_CAPSULE | Freq: Two times a day (BID) | ORAL | 0 refills | Status: AC

## 2024-08-30 MED ORDER — OXYCODONE HCL 5 MG/5ML PO SOLN: 5.0000 mg | Freq: Once | ORAL | Status: AC | PRN

## 2024-08-30 MED ORDER — FENTANYL CITRATE (PF) 100 MCG/2ML IJ SOLN
INTRAMUSCULAR | Status: AC
  Filled 2024-08-30: qty 2

## 2024-08-30 MED ORDER — POVIDONE-IODINE 10 % EX SWAB: 2.0000 | Freq: Once | CUTANEOUS | Status: DC

## 2024-08-30 MED ORDER — METHOCARBAMOL 500 MG PO TABS: 500.0000 mg | ORAL_TABLET | Freq: Three times a day (TID) | ORAL | 0 refills | Status: AC | PRN

## 2024-08-30 MED ORDER — SODIUM CHLORIDE (PF) 0.9 % IJ SOLN
INTRAMUSCULAR | Status: DC | PRN
  Administered 2024-08-30: 80 mL

## 2024-08-30 MED ORDER — ONDANSETRON HCL 4 MG/2ML IJ SOLN
4.0000 mg | Freq: Once | INTRAMUSCULAR | Status: AC | PRN
  Administered 2024-08-30: 4 mg via INTRAVENOUS

## 2024-08-30 MED ORDER — METHOCARBAMOL 500 MG PO TABS
500.0000 mg | ORAL_TABLET | Freq: Four times a day (QID) | ORAL | Status: DC | PRN
  Administered 2024-08-30 – 2024-08-31 (×3): 500 mg via ORAL
  Filled 2024-08-30 (×5): qty 1

## 2024-08-30 MED ORDER — CHLORHEXIDINE GLUCONATE 0.12 % MT SOLN
15.0000 mL | Freq: Once | OROMUCOSAL | Status: AC
  Administered 2024-08-30: 15 mL via OROMUCOSAL

## 2024-08-30 MED ORDER — BUPIVACAINE-EPINEPHRINE (PF) 0.25% -1:200000 IJ SOLN
INTRAMUSCULAR | Status: AC
  Filled 2024-08-30: qty 30

## 2024-08-30 MED ORDER — FENTANYL CITRATE (PF) 100 MCG/2ML IJ SOLN
INTRAMUSCULAR | Status: DC | PRN
  Administered 2024-08-30: 50 ug via INTRAVENOUS
  Administered 2024-08-30 (×2): 25 ug via INTRAVENOUS

## 2024-08-30 MED ORDER — METHOCARBAMOL 1000 MG/10ML IJ SOLN: 500.0000 mg | Freq: Four times a day (QID) | INTRAMUSCULAR | Status: DC | PRN

## 2024-08-30 MED ORDER — OMEPRAZOLE 40 MG PO CPDR: 40.0000 mg | DELAYED_RELEASE_CAPSULE | Freq: Every day | ORAL | 0 refills | Status: AC

## 2024-08-30 MED ORDER — OXYCODONE HCL 5 MG PO TABS
ORAL_TABLET | ORAL | Status: AC
  Filled 2024-08-30: qty 1

## 2024-08-30 MED ORDER — DOCUSATE SODIUM 100 MG PO CAPS
100.0000 mg | ORAL_CAPSULE | Freq: Two times a day (BID) | ORAL | Status: DC
  Administered 2024-08-30 – 2024-08-31 (×2): 100 mg via ORAL
  Filled 2024-08-30 (×2): qty 1

## 2024-08-30 MED ORDER — BUPIVACAINE LIPOSOME 1.3 % IJ SUSP: 20.0000 mL | Freq: Once | INTRAMUSCULAR | Status: DC

## 2024-08-30 MED ORDER — FENTANYL CITRATE (PF) 50 MCG/ML IJ SOSY: 25.0000 ug | PREFILLED_SYRINGE | INTRAMUSCULAR | Status: DC | PRN

## 2024-08-30 MED ORDER — OXYCODONE HCL 5 MG PO TABS
5.0000 mg | ORAL_TABLET | Freq: Once | ORAL | Status: AC | PRN
  Administered 2024-08-30: 5 mg via ORAL

## 2024-08-30 MED ORDER — ONDANSETRON HCL 4 MG PO TABS: 4.0000 mg | ORAL_TABLET | Freq: Four times a day (QID) | ORAL | Status: DC | PRN

## 2024-08-30 MED ORDER — MEPIVACAINE HCL (PF) 2 % IJ SOLN: INTRAMUSCULAR | Status: DC | PRN

## 2024-08-30 MED ORDER — OXYCODONE HCL 5 MG PO TABS: 5.0000 mg | ORAL_TABLET | ORAL | 0 refills | Status: AC | PRN

## 2024-08-30 MED ORDER — ONDANSETRON HCL 4 MG PO TABS: 4.0000 mg | ORAL_TABLET | Freq: Three times a day (TID) | ORAL | 0 refills | Status: AC | PRN

## 2024-08-30 MED ORDER — CEFAZOLIN SODIUM-DEXTROSE 2-4 GM/100ML-% IV SOLN
2.0000 g | INTRAVENOUS | Status: AC
  Administered 2024-08-30: 2 g via INTRAVENOUS
  Filled 2024-08-30: qty 100

## 2024-08-30 MED ORDER — POLYETHYLENE GLYCOL 3350 17 G PO PACK: 17.0000 g | PACK | Freq: Every day | ORAL | Status: DC | PRN

## 2024-08-30 MED ORDER — ONDANSETRON HCL 4 MG/2ML IJ SOLN: 4.0000 mg | Freq: Four times a day (QID) | INTRAMUSCULAR | Status: DC | PRN

## 2024-08-30 MED ORDER — PANTOPRAZOLE SODIUM 40 MG PO TBEC
40.0000 mg | DELAYED_RELEASE_TABLET | Freq: Every day | ORAL | Status: DC
  Administered 2024-08-30 – 2024-08-31 (×2): 40 mg via ORAL
  Filled 2024-08-30 (×2): qty 1

## 2024-08-30 MED ORDER — ZOLPIDEM TARTRATE 5 MG PO TABS: 5.0000 mg | ORAL_TABLET | Freq: Every evening | ORAL | Status: DC | PRN

## 2024-08-30 MED ORDER — POLYETHYLENE GLYCOL 3350 17 G PO PACK: 17.0000 g | PACK | Freq: Every day | ORAL | 0 refills | Status: AC

## 2024-08-30 MED ORDER — MIDAZOLAM HCL 2 MG/2ML IJ SOLN
INTRAMUSCULAR | Status: AC
  Filled 2024-08-30: qty 2

## 2024-08-30 MED ORDER — MENTHOL 3 MG MT LOZG: 1.0000 | LOZENGE | OROMUCOSAL | Status: DC | PRN

## 2024-08-30 MED ORDER — ACETAMINOPHEN 10 MG/ML IV SOLN: 1000.0000 mg | Freq: Once | INTRAVENOUS | Status: DC | PRN

## 2024-08-30 MED ORDER — MEPIVACAINE HCL (PF) 2 % IJ SOLN
INTRAMUSCULAR | Status: AC
  Filled 2024-08-30: qty 20

## 2024-08-30 MED ORDER — KETOROLAC TROMETHAMINE 15 MG/ML IJ SOLN
7.5000 mg | Freq: Four times a day (QID) | INTRAMUSCULAR | Status: AC
  Administered 2024-08-30 – 2024-08-31 (×4): 7.5 mg via INTRAVENOUS
  Filled 2024-08-30 (×4): qty 1

## 2024-08-30 MED ORDER — ACETAMINOPHEN 500 MG PO TABS: 1000.0000 mg | ORAL_TABLET | Freq: Three times a day (TID) | ORAL | Status: AC | PRN

## 2024-08-30 MED ORDER — LISINOPRIL 5 MG PO TABS
5.0000 mg | ORAL_TABLET | Freq: Every day | ORAL | Status: DC
  Administered 2024-08-31: 5 mg via ORAL
  Filled 2024-08-30: qty 1

## 2024-08-30 MED ORDER — PHENOL 1.4 % MT LIQD: 1.0000 | OROMUCOSAL | Status: DC | PRN

## 2024-08-30 MED ORDER — ACETAMINOPHEN 500 MG PO TABS
1000.0000 mg | ORAL_TABLET | Freq: Four times a day (QID) | ORAL | Status: AC
  Administered 2024-08-30 – 2024-08-31 (×4): 1000 mg via ORAL
  Filled 2024-08-30 (×4): qty 2

## 2024-08-30 MED ORDER — PROPOFOL 10 MG/ML IV BOLUS
INTRAVENOUS | Status: DC | PRN
  Administered 2024-08-30: 20 mg via INTRAVENOUS

## 2024-08-30 MED ORDER — HYDROMORPHONE HCL 1 MG/ML IJ SOLN: 0.5000 mg | INTRAMUSCULAR | Status: DC | PRN

## 2024-08-30 MED ORDER — CEFAZOLIN SODIUM-DEXTROSE 2-4 GM/100ML-% IV SOLN
2.0000 g | Freq: Four times a day (QID) | INTRAVENOUS | Status: AC
  Administered 2024-08-30 (×2): 2 g via INTRAVENOUS
  Filled 2024-08-30 (×2): qty 100

## 2024-08-30 MED ORDER — LEVOTHYROXINE SODIUM 88 MCG PO TABS
88.0000 ug | ORAL_TABLET | Freq: Every day | ORAL | Status: DC
  Administered 2024-08-31: 88 ug via ORAL
  Filled 2024-08-30: qty 1

## 2024-08-30 MED ORDER — PROPOFOL 500 MG/50ML IV EMUL
INTRAVENOUS | Status: DC | PRN
  Administered 2024-08-30: 85 ug/kg/min via INTRAVENOUS

## 2024-08-30 MED ORDER — DIPHENHYDRAMINE HCL 12.5 MG/5ML PO ELIX: 12.5000 mg | ORAL_SOLUTION | ORAL | Status: DC | PRN

## 2024-08-30 MED ORDER — BUPIVACAINE-EPINEPHRINE (PF) 0.5% -1:200000 IJ SOLN
INTRAMUSCULAR | Status: DC | PRN
  Administered 2024-08-30: 20 mL via PERINEURAL

## 2024-08-30 MED ORDER — 0.9 % SODIUM CHLORIDE (POUR BTL) OPTIME
TOPICAL | Status: DC | PRN
  Administered 2024-08-30: 1000 mL

## 2024-08-30 MED ORDER — ORAL CARE MOUTH RINSE: 15.0000 mL | Freq: Once | OROMUCOSAL | Status: AC

## 2024-08-30 SURGICAL SUPPLY — 53 items
BAG COUNTER SPONGE SURGICOUNT (BAG) IMPLANT
BLADE SAG 18X100X1.27 (BLADE) ×2 IMPLANT
BLADE SAW SAG 35X64 .89 (BLADE) ×2 IMPLANT
BLADE SAW SGTL 11.0X1.19X90.0M (BLADE) IMPLANT
BNDG COHESIVE 3X5 TAN ST LF (GAUZE/BANDAGES/DRESSINGS) ×2 IMPLANT
BNDG ELASTIC 4X5.8 VLCR NS LF (GAUZE/BANDAGES/DRESSINGS) IMPLANT
BNDG ELASTIC 6X10 VLCR STRL LF (GAUZE/BANDAGES/DRESSINGS) ×2 IMPLANT
BOWL SMART MIX CTS (DISPOSABLE) IMPLANT
CEMENT BONE R 1X40 (Cement) IMPLANT
CHLORAPREP W/TINT 26 (MISCELLANEOUS) ×4 IMPLANT
CLSR STERI-STRIP ANTIMIC 1/2X4 (GAUZE/BANDAGES/DRESSINGS) IMPLANT
COVER SURGICAL LIGHT HANDLE (MISCELLANEOUS) ×2 IMPLANT
CUFF TRNQT CYL 34X4.125X (TOURNIQUET CUFF) ×2 IMPLANT
DERMABOND ADVANCED .7 DNX12 (GAUZE/BANDAGES/DRESSINGS) ×4 IMPLANT
DRAPE INCISE IOBAN 85X60 (DRAPES) ×2 IMPLANT
DRAPE SHEET LG 3/4 BI-LAMINATE (DRAPES) ×2 IMPLANT
DRAPE U-SHAPE 47X51 STRL (DRAPES) ×2 IMPLANT
DRESSING AQUACEL AG SP 3.5X10 (GAUZE/BANDAGES/DRESSINGS) IMPLANT
DRSG AQUACEL AG ADV 3.5X10 (GAUZE/BANDAGES/DRESSINGS) ×2 IMPLANT
ELECT REM PT RETURN 15FT ADLT (MISCELLANEOUS) ×2 IMPLANT
FEMORAL KNEE COMP SZ 8STD LT (Knees) IMPLANT
GAUZE SPONGE 4X4 12PLY STRL (GAUZE/BANDAGES/DRESSINGS) ×2 IMPLANT
GLOVE BIO SURGEON STRL SZ 6.5 (GLOVE) ×4 IMPLANT
GLOVE BIOGEL PI IND STRL 6.5 (GLOVE) ×2 IMPLANT
GLOVE BIOGEL PI IND STRL 8 (GLOVE) ×2 IMPLANT
GLOVE SURG ORTHO 8.0 STRL STRW (GLOVE) ×4 IMPLANT
GOWN STRL REUS W/ TWL XL LVL3 (GOWN DISPOSABLE) ×4 IMPLANT
HOLDER FOLEY CATH W/STRAP (MISCELLANEOUS) ×2 IMPLANT
HOOD PEEL AWAY T7 (MISCELLANEOUS) ×6 IMPLANT
KIT TURNOVER KIT A (KITS) ×2 IMPLANT
LINER TIB PS CD/8-9 10 LT (Liner) IMPLANT
MANIFOLD NEPTUNE II (INSTRUMENTS) ×2 IMPLANT
MARKER SKIN DUAL TIP RULER LAB (MISCELLANEOUS) ×2 IMPLANT
NS IRRIG 1000ML POUR BTL (IV SOLUTION) ×2 IMPLANT
PACK TOTAL KNEE CUSTOM (KITS) ×2 IMPLANT
PENCIL SMOKE EVACUATOR (MISCELLANEOUS) ×2 IMPLANT
PIN DRILL HDLS TROCAR 75 4PK (PIN) IMPLANT
SCREW HEADED 33MM KNEE (MISCELLANEOUS) IMPLANT
SET HNDPC FAN SPRY TIP SCT (DISPOSABLE) ×2 IMPLANT
SOLUTION IRRIG SURGIPHOR (IV SOLUTION) IMPLANT
SOLUTION PRONTOSAN WOUND 350ML (IRRIGATION / IRRIGATOR) IMPLANT
STEM POLY PAT PLY 32M KNEE (Knees) IMPLANT
STEM TIB ST PERS 14+30 (Stem) IMPLANT
STEM TIBIA 5 DEG SZ D L KNEE (Knees) IMPLANT
STRIP CLOSURE SKIN 1/2X4 (GAUZE/BANDAGES/DRESSINGS) ×2 IMPLANT
SUT MNCRL AB 3-0 PS2 18 (SUTURE) ×2 IMPLANT
SUT STRATAFIX 14 PDO 48 VLT (SUTURE) ×2 IMPLANT
SUT VIC AB 2-0 CT2 27 (SUTURE) ×4 IMPLANT
SUTURE STRATFX 0 PDS 27 VIOLET (SUTURE) ×2 IMPLANT
TRAY FOLEY MTR SLVR 14FR STAT (SET/KITS/TRAYS/PACK) IMPLANT
TUBE SUCTION HIGH CAP CLEAR NV (SUCTIONS) ×2 IMPLANT
UNDERPAD 30X36 HEAVY ABSORB (UNDERPADS AND DIAPERS) ×2 IMPLANT
WRAP KNEE MAXI GEL POST OP (GAUZE/BANDAGES/DRESSINGS) ×2 IMPLANT

## 2024-08-30 NOTE — Discharge Instructions (Signed)
 INSTRUCTIONS AFTER JOINT REPLACEMENT   Remove items at home which could result in a fall. This includes throw rugs or furniture in walking pathways ICE to the affected joint every three hours while awake for 30 minutes at a time, for at least the first 3-5 days, and then as needed for pain and swelling.  Continue to use ice for pain and swelling. You may notice swelling that will progress down to the foot and ankle.  This is normal after surgery.  Elevate your leg when you are not up walking on it.   Continue to use the breathing machine you got in the hospital (incentive spirometer) which will help keep your temperature down.  It is common for your temperature to cycle up and down following surgery, especially at night when you are not up moving around and exerting yourself.  The breathing machine keeps your lungs expanded and your temperature down.  DIET:  As you were doing prior to hospitalization, we recommend a well-balanced diet.  DRESSING / WOUND CARE / SHOWERING:  Keep the surgical dressing until follow up.  The dressing is water proof, so you can shower without any extra covering.  IF THE DRESSING FALLS OFF or the wound gets wet inside, change the dressing with sterile gauze.  Please use good hand washing techniques before changing the dressing.  Do not use any lotions or creams on the incision until instructed by your surgeon.    ACTIVITY  Increase activity slowly as tolerated, but follow the weight bearing instructions below.   No driving for 6 weeks or until further direction given by your physician.  You cannot drive while taking narcotics.  No lifting or carrying greater than 10 lbs. until further directed by your surgeon. Avoid periods of inactivity such as sitting longer than an hour when not asleep. This helps prevent blood clots.  You may return to work once you are authorized by your doctor.   WEIGHT BEARING: Weight bearing as tolerated with assist device (walker, cane, etc) as  directed, use it as long as suggested by your surgeon or therapist, typically at least 4-6 weeks.  EXERCISES  Results after joint replacement surgery are often greatly improved when you follow the exercise, range of motion and muscle strengthening exercises prescribed by your doctor. Safety measures are also important to protect the joint from further injury. Any time any of these exercises cause you to have increased pain or swelling, decrease what you are doing until you are comfortable again and then slowly increase them. If you have problems or questions, call your caregiver or physical therapist for advice.   Rehabilitation is important following a joint replacement. After just a few days of immobilization, the muscles of the leg can become weakened and shrink (atrophy).  These exercises are designed to build up the tone and strength of the thigh and leg muscles and to improve motion. Often times heat used for twenty to thirty minutes before working out will loosen up your tissues and help with improving the range of motion but do not use heat for the first two weeks following surgery (sometimes heat can increase post-operative swelling).   These exercises can be done on a training (exercise) mat, on the floor, on a table or on a bed. Use whatever works the best and is most comfortable for you.    Use music or television while you are exercising so that the exercises are a pleasant break in your day. This will make your life  better with the exercises acting as a break in your routine that you can look forward to.   Perform all exercises about fifteen times, three times per day or as directed.  You should exercise both the operative leg and the other leg as well.  Exercises include:   Quad Sets - Tighten up the muscle on the front of the thigh (Quad) and hold for 5-10 seconds.   Straight Leg Raises - With your knee straight (if you were given a brace, keep it on), lift the leg to 60 degrees, hold  for 3 seconds, and slowly lower the leg.  Perform this exercise against resistance later as your leg gets stronger.  Leg Slides: Lying on your back, slowly slide your foot toward your buttocks, bending your knee up off the floor (only go as far as is comfortable). Then slowly slide your foot back down until your leg is flat on the floor again.  Angel Wings: Lying on your back spread your legs to the side as far apart as you can without causing discomfort.  Hamstring Strength:  Lying on your back, push your heel against the floor with your leg straight by tightening up the muscles of your buttocks.  Repeat, but this time bend your knee to a comfortable angle, and push your heel against the floor.  You may put a pillow under the heel to make it more comfortable if necessary.   A rehabilitation program following joint replacement surgery can speed recovery and prevent re-injury in the future due to weakened muscles. Contact your doctor or a physical therapist for more information on knee rehabilitation.   CONSTIPATION:  Constipation is defined medically as fewer than three stools per week and severe constipation as less than one stool per week.  Even if you have a regular bowel pattern at home, your normal regimen is likely to be disrupted due to multiple reasons following surgery.  Combination of anesthesia, postoperative narcotics, change in appetite and fluid intake all can affect your bowels.   YOU MUST use at least one of the following options; they are listed in order of increasing strength to get the job done.  They are all available over the counter, and you may need to use some, POSSIBLY even all of these options:    Drink plenty of fluids (prune juice may be helpful) and high fiber foods Colace 100 mg by mouth twice a day  Senokot for constipation as directed and as needed Dulcolax (bisacodyl), take with full glass of water  Miralax (polyethylene glycol) once or twice a day as needed.  If you  have tried all these things and are unable to have a bowel movement in the first 3-4 days after surgery call either your surgeon or your primary doctor.    If you experience loose stools or diarrhea, hold the medications until you stool forms back up.  If your symptoms do not get better within 1 week or if they get worse, check with your doctor.  If you experience the worst abdominal pain ever or develop nausea or vomiting, please contact the office immediately for further recommendations for treatment.  ITCHING:  If you experience itching with your medications, try taking only a single pain pill, or even half a pain pill at a time.  You can also use Benadryl over the counter for itching or also to help with sleep.   TED HOSE STOCKINGS:  Use stockings on both legs until for at least 2 weeks or  as directed by physician office. They may be removed at night for sleeping.  MEDICATIONS:  See your medication summary on the "After Visit Summary" that nursing will review with you.  You may have some home medications which will be placed on hold until you complete the course of blood thinner medication.  It is important for you to complete the blood thinner medication as prescribed.  Blood clot prevention (DVT Prophylaxis): After surgery you are at an increased risk for a blood clot.  You are to resume your home Eliquis  2.5mg , to be taken twice daily for a total of 4 weeks from surgery to help reduce your risk of getting a blood clot.  Then continue taking it as prescribed by your regular prescribing provider.  Signs of a pulmonary embolus (blood clot in the lungs) include sudden short of breath, feeling lightheaded or dizzy, chest pain with a deep breath, rapid pulse rapid breathing.  Signs of a blood clot in your arms or legs include new unexplained swelling and cramping, warm, red or darkened skin around the painful area.  Please call the office or 911 right away if these signs or symptoms  develop.  PRECAUTIONS:   If you experience chest pain or shortness of breath - call 911 immediately for transfer to the hospital emergency department.   If you develop a fever greater that 101 F, purulent drainage from wound, increased redness or drainage from wound, foul odor from the wound/dressing, or calf pain - CONTACT YOUR SURGEON.                                                   FOLLOW-UP APPOINTMENTS:  If you do not already have a post-op appointment, please call the office for an appointment to be seen by your surgeon.  Guidelines for how soon to be seen are listed in your "After Visit Summary", but are typically between 2-3 weeks after surgery.  If you have a specialized bandage, you may be told to follow up 1 week after surgery.  OTHER INSTRUCTIONS:  Knee Replacement:  Do not place pillow under knee, focus on keeping the knee straight while resting.  Place foam block, curve side up under heel at all times except when walking.  DO NOT modify, tear, cut, or change the foam block in any way.  POST-OPERATIVE OPIOID TAPER INSTRUCTIONS: It is important to wean off of your opioid medication as soon as possible. If you do not need pain medication after your surgery it is ok to stop day one. Opioids include: Codeine, Hydrocodone(Norco, Vicodin), Oxycodone(Percocet, oxycontin) and hydromorphone amongst others.  Long term and even short term use of opiods can cause: Increased pain response Dependence Constipation Depression Respiratory depression And more.  Withdrawal symptoms can include Flu like symptoms Nausea, vomiting And more Techniques to manage these symptoms Hydrate well Eat regular healthy meals Stay active Use relaxation techniques(deep breathing, meditating, yoga) Do Not substitute Alcohol to help with tapering If you have been on opioids for less than two weeks and do not have pain than it is ok to stop all together.  Plan to wean off of opioids This plan should start  within one week post op of your joint replacement. Maintain the same interval or time between taking each dose and first decrease the dose.  Cut the total daily intake of opioids  by one tablet each day Next start to increase the time between doses. The last dose that should be eliminated is the evening dose.   MAKE SURE YOU:  Understand these instructions.  Get help right away if you are not doing well or get worse.    Thank you for letting us  be a part of your medical care team.  It is a privilege we respect greatly.  We hope these instructions will help you stay on track for a fast and full recovery!

## 2024-08-30 NOTE — Anesthesia Postprocedure Evaluation (Signed)
 Anesthesia Post Note  Patient: Crystal Odom  Procedure(s) Performed: ARTHROPLASTY, KNEE, TOTAL (Left: Knee)     Patient location during evaluation: PACU Anesthesia Type: Spinal Level of consciousness: awake and alert Pain management: pain level controlled Vital Signs Assessment: post-procedure vital signs reviewed and stable Respiratory status: spontaneous breathing, nonlabored ventilation, respiratory function stable and patient connected to nasal cannula oxygen Cardiovascular status: blood pressure returned to baseline and stable Postop Assessment: no apparent nausea or vomiting Anesthetic complications: no   No notable events documented.  Last Vitals:  Vitals:   08/30/24 1045 08/30/24 1100  BP: 120/62 (!) 121/93  Pulse: (!) 49 (!) 50  Resp: 13 16  Temp: 36.5 C 36.4 C  SpO2: 98%     Last Pain:  Vitals:   08/30/24 1149  TempSrc:   PainSc: 2                  Crystal Odom

## 2024-08-30 NOTE — Anesthesia Procedure Notes (Signed)
 Procedure Name: MAC Date/Time: 08/30/2024 7:18 AM  Performed by: Zulema Leita PARAS, CRNAPre-anesthesia Checklist: Patient identified, Emergency Drugs available, Suction available and Patient being monitored Oxygen Delivery Method: Simple face mask

## 2024-08-30 NOTE — Plan of Care (Signed)

## 2024-08-30 NOTE — Anesthesia Procedure Notes (Signed)
 Anesthesia Regional Block: Adductor canal block   Pre-Anesthetic Checklist: , timeout performed,  Correct Patient, Correct Site, Correct Laterality,  Correct Procedure, Correct Position, site marked,  Risks and benefits discussed,  Surgical consent,  Pre-op evaluation,  At surgeon's request and post-op pain management  Laterality: Left  Prep: Maximum Sterile Barrier Precautions used, chloraprep       Needles:  Injection technique: Single-shot  Needle Type: Echogenic Needle      Needle Gauge: 20     Additional Needles:   Procedures:,,,, ultrasound used (permanent image in chart),,    Narrative:  Start time: 08/30/2024 7:00 AM End time: 08/30/2024 7:10 AM Injection made incrementally with aspirations every 5 mL.  Performed by: Personally  Anesthesiologist: Keneth Lynwood POUR, MD

## 2024-08-30 NOTE — Op Note (Addendum)
 DATE OF SURGERY:  08/30/2024 TIME: 9:18 AM  PATIENT NAME:  Crystal Odom   AGE: 83 y.o.    PRE-OPERATIVE DIAGNOSIS: End-stage left knee osteoarthritis  POST-OPERATIVE DIAGNOSIS:  Same  PROCEDURE: Cemented left total Knee Arthroplasty  SURGEON:  Therron Sells A Hence Derrick, MD   ASSISTANT: Bernarda Mclean, PA-C, present and scrubbed throughout the case, critical for assistance with exposure, retraction, instrumentation, and closure.   OPERATIVE IMPLANTS:  Zimmer persona left femur standard size 8, left D tibial baseplate with 30 mm tibial stem extension, 32 mm all poly cemented patella, 10 mm MC poly insert Implant Name Type Inv. Item Serial No. Manufacturer Lot No. LRB No. Used Action  CEMENT BONE R 1X40 - [id number] Cement CEMENT BONE R 1X40  ZIMMER [employer]ON(ORTH,TRAU,BIO,SG) [id number] Left 2 Implanted  STEM POLY PAT PLY 57M KNEE - [id number] Knees STEM POLY PAT PLY 57M KNEE  ZIMMER [employer]ON(ORTH,TRAU,BIO,SG) [id number] Left 1 Implanted  STEM TIB ST PERS 14+30 - [id number] Stem STEM TIB ST PERS 14+30  ZIMMER [employer]ON(ORTH,TRAU,BIO,SG) [id number] Left 1 Implanted  STEM TIBIA 5 DEG SZ D L KNEE - [id number] Knees STEM TIBIA 5 DEG SZ D L KNEE  [employer]IMMER [employer]ON(ORTH,TRAU,BIO,SG) [id number] Left 1 Implanted  FEMORAL KNEE COMP SZ 8STD LT - [id number] Knees FEMORAL KNEE COMP SZ 8STD LT  ZIMMER [employer]ON(ORTH,TRAU,BIO,SG) [id number] Left 1 Implanted  LINER TIB PS CD/8-9 10 LT - [id number] Liner LINER TIB PS CD/8-9 10 LT  [employer]MER [employer]ON(ORTH,TRAU,BIO,SG) [id number] Left 1 Implanted      PREOPERATIVE INDICATIONS:  [name] is a 83 y.o. year old female with end stage bone on bone degenerative arthritis of the knee who failed conservative treatment, including injections, antiinflammatories, activity modification, and assistive devices, and had significant impairment of their activities of daily living, and elected for Total Knee Arthroplasty.   The risks, benefits, and alternatives were discussed at  length including but not limited to the risks of infection, bleeding, nerve injury, stiffness, blood clots, the need for revision surgery, cardiopulmonary complications, among others, and they were willing to proceed.  OPERATIVE FINDINGS AND UNIQUE ASPECTS OF THE CASE: Very soft bone especially of the tibial side elected for 30 mm stem extension for additional tibial fixation.  ESTIMATED BLOOD LOSS: 30cc  OPERATIVE DESCRIPTION:   Once adequate anesthesia was induced, preoperative antibiotics, 2 gm of ancef,1 gm of Tranexamic Acid, and 8 mg of Decadron  administered, the patient was positioned supine with a left thigh tourniquet placed.  The left lower extremity was prepped and draped in sterile fashion.  A time-  out was performed identifying the patient, planned procedure, and the appropriate extremity.     The leg was  exsanguinated, tourniquet elevated to 250 mmHg.  A midline incision was  made followed by median parapatellar arthrotomy. Anterior horn of the medial meniscus was released and resected. A medial release was performed, the infrapatellar fat pad was resected with care taken to protect the patellar tendon. The suprapatellar fat was removed to exposed the distal anterior femur. The anterior horn of the lateral meniscus and ACL were released.    Following initial  exposure, I first started with the femur  The femoral  canal was opened with a drill, canal was suctioned to try to prevent fat emboli.  An  intramedullary rod was passed set at 5 degrees valgus, 10 mm. The distal femur was resected.  Following this resection, the tibia was  subluxated anteriorly.  Using the extramedullary guide, 10 mm of bone was resected off  the proximal lateral tibia.  We confirmed the gap would be  stable medially and laterally with a size 10mm spacer block as well as confirmed that the tibial cut was perpendicular in the coronal plane, checking with an alignment rod.    Once this was done, the posterior  femoral referencing femoral sizer was placed under to the posterior condyles with 5 degrees of external rotational which was parallel to the transepicondylar axis and perpendicular to [employer]. The femur was sized to be a size 8 in the anterior-  posterior dimension. The  anterior, posterior, and  chamfer cuts were made without difficulty nor   notching making certain that I was along the anterior cortex to help  with flexion gap stability. Next a laminar spreader was placed with the knee in flexion and the medial lateral menisci were resected.  5 cc of the Exparel mixture was injected in the medial side of the back of the knee and 3 cc in the lateral side.  1/2 inch curved osteotome was used to resect posterior osteophyte that was then removed with a pituitary rongeur.       At this point, the tibia was sized to be a size D.  The size D tray was  then pinned in position. Trial reduction was now carried with a 8 femur, D tibia, a 10 mm MC insert.  The knee was still lacking full extension and relatively tight in flexion.  Elected to resect additional 2 mm off the tibia to provide additional laxity.  With the trial components and now, the knee had full extension and was stable to varus valgus stress in extension.  The knee was slightly tight in flexion and the PCL was partially released.   Attention was next directed to the patella.  Precut  measurement was noted to be 20 mm.  I resected down to 14 mm and used a  32mm patellar button to restore patellar height as well as cover the cut surface.     The patella lug holes were drilled and a 32 mm patella poly trial was placed.    The knee was brought to full extension with good flexion stability with the patella tracking through the trochlea without application of pressure.     Next the femoral component was again assessed and determined to be seated and appropriately lateralized.  The femoral lug holes were drilled.  The femoral component was then  removed. Tibial component was again assessed and felt to be seated and appropriately rotated with the medial third of the tubercle. The tibia was then drilled, and keel punched.     Final components were  opened and cement was mixed.      Final implants were then  cemented onto cleaned and dried cut surfaces of bone with the knee brought to extension with a 10mm MC poly.  The knee was irrigated with sterile Betadine diluted in saline as well as pulse lavage normal saline.  The synovial lining was  then injected a dilute Exparel with 30cc of 0.25% marcaine with epinephrine.         Once the cement had fully cured, excess cement was removed throughout the knee.  I confirmed that I was satisfied with the range of motion and stability, and the final 10mm MC poly insert was chosen.  It was placed into the knee.         The tourniquet had been let down at 60 minutes.  No significant hemostasis was required.  The medial parapatellar arthrotomy was then reapproximated using #1 Stratafix sutures with the knee  in flexion.  The remaining wound was closed with 0 stratafix, 2-0 Vicryl, and running 3-0 Monocryl. The knee was cleaned, dried, dressed sterilely using Dermabond and   Aquacel dressing.  The patient was then brought to recovery room in stable condition, tolerating the procedure  well. There were no complications.   Post op recs: WB: WBAT Abx: ancef  Imaging: PACU xrays DVT prophylaxis: Resume Eliquis  2.5mg  twice daily postop day 1 Follow up: 2 weeks after surgery for a wound check with Dr. Edna at [facility].  Address: [address], [place], [place] [address]  Office Phone: [phone]  Toribio Edna, MD Orthopaedic Surgery

## 2024-08-30 NOTE — Transfer of Care (Signed)
 Immediate Anesthesia Transfer of Care Note  Patient: Crystal Odom  Procedure(s) Performed: ARTHROPLASTY, KNEE, TOTAL (Left: Knee)  Patient Location: PACU  Anesthesia Type:MAC and Spinal  Level of Consciousness: awake, alert , and oriented  Airway & Oxygen Therapy: Patient Spontanous Breathing and Patient connected to face mask oxygen  Post-op Assessment: Report given to RN and Post -op Vital signs reviewed and stable  Post vital signs: Reviewed and stable  Last Vitals:  Vitals Value Taken Time  BP    Temp    Pulse    Resp    SpO2      Last Pain:  Vitals:   08/30/24 0543  TempSrc: Oral  PainSc: 0-No pain         Complications: No notable events documented.

## 2024-08-30 NOTE — Evaluation (Signed)
 Physical Therapy Evaluation Patient Details Name: Crystal Odom MRN: [id number] DOB: 01/14/1941 Today's Date: 08/30/2024  History of Present Illness  83  yo female S/P  LTKA 08/30/24. PMH: diplopia, UTI,  afib, HTN, arrythmia  Clinical Impression   The patient reports remaining tingling numbness, lag with SLR  on LLE. Patient able to mobilize to sitting and standing, stepped to recliner with support a the Left knee for buckling.  Patient should  progress  to Dc home  tomorrow. . Pt admitted with above diagnosis.  Pt currently with functional limitations due to the deficits listed below (see PT Problem List). Pt will benefit from acute skilled PT to increase their independence and safety with mobility to allow discharge.           If plan is discharge home, recommend the following: A little help with walking and/or transfers;A little help with bathing/dressing/bathroom;Help with stairs or ramp for entrance;Assistance with cooking/housework   Can travel by private vehicle        Equipment Recommendations Rolling walker (2 wheels)  Recommendations for Other Services       Functional Status Assessment Patient has had a recent decline in their functional status and demonstrates the ability to make significant improvements in function in a reasonable and predictable amount of time.     Precautions / Restrictions Precautions Precautions: Fall;Knee Restrictions Weight Bearing Restrictions Per Provider Order: No      Mobility  Bed Mobility Overal bed mobility: Needs Assistance Bed Mobility: Supine to Sit     Supine to sit: Contact guard     General bed mobility comments: support the LLE    Transfers Overall transfer level: Needs assistance Equipment used: Rolling walker (2 wheels) Transfers: Sit to/from Stand, Bed to chair/wheelchair/BSC Sit to Stand: Min assist   Step pivot transfers: Min assist       General transfer comment: noted slight  knee buckle with  WB. Able to step  to recliner with PT support at Left knee.    Ambulation/Gait                  Stairs            Wheelchair Mobility     Tilt Bed    Modified Rankin (Stroke Patients Only)       Balance Overall balance assessment: Needs assistance Sitting-balance support: Feet supported, No upper extremity supported Sitting balance-Leahy Scale: Good     Standing balance support: Reliant on assistive device for balance, During functional activity, Bilateral upper extremity supported Standing balance-Leahy Scale: Poor                               Pertinent Vitals/Pain Pain Assessment Pain Assessment: No/denies pain    Home Living Family/patient expects to be discharged to:: Private residence Living Arrangements: Spouse/significant other Available Help at Discharge: Family Type of Home: House Home Access: Stairs to enter Entrance Stairs-Rails: Right Entrance Stairs-Number of Steps: 2   Home Layout: One level Home Equipment: Cane - single point      Prior Function Prior Level of Function : Independent/Modified Independent                     Extremity/Trunk Assessment   Upper Extremity Assessment Upper Extremity Assessment: Overall WFL for tasks assessed    Lower Extremity Assessment Lower Extremity Assessment: LLE deficits/detail LLE Deficits / Details: + SLR with  slight lag. reports  some decreased senation in foot    Cervical / Trunk Assessment Cervical / Trunk Assessment: Normal  Communication   Communication Communication: No apparent difficulties    Cognition Arousal: Alert Behavior During Therapy: WFL for tasks assessed/performed   PT - Cognitive impairments: No apparent impairments                         Following commands: Intact       Cueing       General Comments      Exercises Total Joint Exercises Ankle Circles/Pumps: AROM, Both, 10 reps Quad Sets: AROM, Both, 10 reps Heel  Slides: AROM, Left, 5 reps   Assessment/Plan    PT Assessment Patient needs continued PT services  PT Problem List Decreased strength;Decreased range of motion;Decreased activity tolerance;Decreased balance;Decreased mobility;Decreased safety awareness;Decreased knowledge of precautions       PT Treatment Interventions DME instruction;Gait training;Stair training;Functional mobility training;Therapeutic activities;Therapeutic exercise;Patient/family education    PT Goals (Current goals can be found in the Care Plan section)  Acute Rehab PT Goals Patient Stated Goal: go home PT Goal Formulation: With patient/family Time For Goal Achievement: 09/06/24 Potential to Achieve Goals: Good    Frequency 7X/week     Co-evaluation               AM-PAC PT 6 Clicks Mobility  Outcome Measure Help needed turning from your back to your side while in a flat bed without using bedrails?: None Help needed moving from lying on your back to sitting on the side of a flat bed without using bedrails?: A Little Help needed moving to and from a bed to a chair (including a wheelchair)?: A Little Help needed standing up from a chair using your arms (e.g., wheelchair or bedside chair)?: A Little Help needed to walk in hospital room?: A Lot Help needed climbing 3-5 steps with a railing? : A Lot 6 Click Score: 17    End of Session Equipment Utilized During Treatment: Gait belt Activity Tolerance: Patient tolerated treatment well Patient left: in chair;with call bell/phone within reach;with family/visitor present Nurse Communication: Mobility status PT Visit Diagnosis: Unsteadiness on feet (R26.81);Muscle weakness (generalized) (M62.81)    Time: 8540-8471 PT Time Calculation (min) (ACUTE ONLY): 29 min   Charges:   PT Evaluation $PT Eval Low Complexity: 1 Low PT Treatments $Therapeutic Activity: 8-22 mins PT General Charges $$ ACUTE PT VISIT: 1 Visit         Darice Potters PT Acute  Rehabilitation Services Office [phone]   Telisha, Zawadzki 08/30/2024, 3:40 PM

## 2024-08-30 NOTE — Progress Notes (Signed)
 Orthopedic Tech Progress Note Patient Details:  Crystal Odom 04-Jun-1941 [id number]  Ortho Devices Type of Ortho Device: Bone foam zero knee Ortho Device/Splint Location: LLE Ortho Device/Splint Interventions: Ordered   Post Interventions Instructions Provided: Adjustment of device, Care of device, Poper ambulation with device  Morna Pink 08/30/2024, 11:08 AM

## 2024-08-30 NOTE — Anesthesia Procedure Notes (Signed)
 Spinal  Patient location during procedure: OR Start time: 08/30/2024 7:20 AM End time: 08/30/2024 7:25 AM Reason for block: surgical anesthesia Staffing Performed: anesthesiologist  Anesthesiologist: Keneth Lynwood POUR, MD Performed by: Keneth Lynwood POUR, MD Authorized by: Keneth Lynwood POUR, MD   Preanesthetic Checklist Completed: patient identified, IV checked, site marked, risks and benefits discussed, surgical consent, monitors and equipment checked, pre-op evaluation and timeout performed Spinal Block Patient position: sitting Prep: DuraPrep Patient monitoring: heart rate, cardiac monitor, continuous pulse ox and blood pressure Approach: midline Location: L3-4 Injection technique: single-shot Needle Needle type: Sprotte  Needle gauge: 24 G Needle length: 9 cm Assessment Sensory level: T4 Events: CSF return

## 2024-08-30 NOTE — Interval H&P Note (Signed)

## 2024-08-31 ENCOUNTER — Encounter (HOSPITAL_COMMUNITY): Payer: Self-pay | Admitting: Orthopedic Surgery

## 2024-08-31 DIAGNOSIS — M1712 Unilateral primary osteoarthritis, left knee: Secondary | ICD-10-CM | POA: Diagnosis not present

## 2024-08-31 LAB — BASIC METABOLIC PANEL WITH GFR
Anion gap: 7 (ref 5–15)
BUN: 20 mg/dL (ref 8–23)
CO2: 25 mmol/L (ref 22–32)
Calcium: 9.2 mg/dL (ref 8.9–10.3)
Chloride: 104 mmol/L (ref 98–111)
Creatinine, Ser: 1.06 mg/dL — ABNORMAL HIGH (ref 0.44–1.00)
GFR, Estimated: 53 mL/min — ABNORMAL LOW (ref >60)
Glucose, Bld: 110 mg/dL — ABNORMAL HIGH (ref 70–99)
Potassium: 4.1 mmol/L (ref 3.5–5.1)
Sodium: 136 mmol/L (ref 135–145)

## 2024-08-31 LAB — CBC
HCT: 32.8 % — ABNORMAL LOW (ref 36.0–46.0)
Hemoglobin: 10.9 g/dL — ABNORMAL LOW (ref 12.0–15.0)
MCH: 32 pg (ref 26.0–34.0)
MCHC: 33.2 g/dL (ref 30.0–36.0)
MCV: 96.2 fL (ref 80.0–100.0)
Platelets: 228 K/uL (ref 150–400)
RBC: 3.41 MIL/uL — ABNORMAL LOW (ref 3.87–5.11)
RDW: 13.7 % (ref 11.5–15.5)
WBC: 8.1 K/uL (ref 4.0–10.5)
nRBC: 0 % (ref 0.0–0.2)

## 2024-08-31 NOTE — TOC Transition Note (Signed)
 Transition of Care [facility]) - Discharge Note   Patient Details  Name: Crystal Odom MRN: [id number] Date of Birth: 10-Dec-1940  Transition of Care [facility]) CM/SW Contact:  NORMAN ASPEN, LCSW Phone Number: 08/31/2024, 9:39 AM   Clinical Narrative:    Met with pt and confirming she has received RW to room via [employer].  OPPT already arranged with [facility].  No further IP CM needs.   Final next level of care: OP Rehab Barriers to Discharge: No Barriers Identified   Patient Goals and CMS Choice            Discharge Placement                       Discharge Plan and Services Additional resources added to the After Visit Summary for                  DME Arranged: Walker rolling DME Agency: [employer]                  Social Drivers of Health (SDOH) Interventions SDOH Screenings   Food Insecurity: No Food Insecurity (08/30/2024)  Housing: Low Risk  (08/30/2024)  Transportation Needs: No Transportation Needs (08/30/2024)  Utilities: Not At Risk (08/30/2024)  Alcohol Screen: Low Risk  (06/23/2024)  Depression (PHQ2-9): Low Risk  (11/06/2023)  Financial Resource Strain: Low Risk  (06/23/2024)  Physical Activity: Sufficiently Active (06/23/2024)  Social Connections: Socially Integrated (08/30/2024)  Stress: No Stress Concern Present (06/23/2024)  Tobacco Use: Low Risk  (08/30/2024)  Health Literacy: Adequate Health Literacy (11/06/2023)     Readmission Risk Interventions     No data to display

## 2024-08-31 NOTE — Progress Notes (Signed)
     Subjective:  Patient reports pain as mild.  Doing very well so far.  Mobilized to the recliner yesterday with therapy.  No concerns overnight.  Discussed plan for mobilization with therapy today and likely discharge home after therapy.  Yesterday's total administered Morphine Milligram Equivalents: 45   Objective:   VITALS:   Vitals:   08/30/24 1323 08/30/24 2101 08/31/24 0110 08/31/24 0639  BP: 134/64 132/71 124/62 130/72  Pulse: (!) 56 65 66 66  Resp: 17 17 14 16   Temp: 97.7 F (36.5 C) 98.3 F (36.8 C) 98.9 F (37.2 C) 98.3 F (36.8 C)  TempSrc:  Oral Oral Oral  SpO2: 98% 96% 96% 97%  Weight:      Height:        Sensation intact distally Intact pulses distally Dorsiflexion/Plantar flexion intact Incision: dressing C/D/I Compartment soft    Lab Results  Component Value Date   WBC 8.1 08/31/2024   HGB 10.9 (L) 08/31/2024   HCT 32.8 (L) 08/31/2024   MCV 96.2 08/31/2024   PLT 228 08/31/2024   BMET    Component Value Date/Time   NA 136 08/31/2024 0342   NA 140 03/09/2024 1133   K 4.1 08/31/2024 0342   CL 104 08/31/2024 0342   CO2 25 08/31/2024 0342   GLUCOSE 110 (H) 08/31/2024 0342   BUN 20 08/31/2024 0342   BUN 15 03/09/2024 1133   CREATININE 1.06 (H) 08/31/2024 0342   CREATININE 0.64 06/22/2020 0739   CALCIUM  9.2 08/31/2024 0342   EGFR 88 03/09/2024 1133   GFRNONAA 53 (L) 08/31/2024 0342   GFRNONAA 86 06/22/2020 0739      Xray: Total knee arthroplasty components in good position no adverse features  Assessment/Plan: 1 Day Post-Op   Principal Problem:   Primary osteoarthritis of left knee  Status post left knee arthroplasty done 08/30/2024  Post op recs: WB: WBAT Abx: ancef  Imaging: PACU xrays DVT prophylaxis: Resume Eliquis  2.5mg  twice daily postop day 1 Follow up: 2 weeks after surgery for a wound check with Dr. Edna at [facility].  Address: [address], [place], [place] [address]  Office Phone: [phone]    TORIBIO DELENA EDNA 08/31/2024, 7:07 AM   Toribio Edna, MD  Contact information:   [phone] 7am-5pm epic message Dr. Edna, or call office for patient follow up: [phone] After hours and holidays please check [email or web address] for group call information for [facility]

## 2024-08-31 NOTE — Care Management Obs Status (Signed)
 MEDICARE OBSERVATION STATUS NOTIFICATION   Patient Details  Name: Ryley Teater MRN: [id number] Date of Birth: [birth year]   Medicare Observation Status Notification Given:  Yes    NORMAN ASPEN, LCSW 08/31/2024, 10:10 AM

## 2024-08-31 NOTE — Discharge Summary (Signed)
 Physician Discharge Summary  Patient ID: Crystal Odom MRN: [id number] DOB/AGE: 81/03/1941 81 y.o.  Admit date: 08/30/2024 Discharge date: 08/31/2024  Admission Diagnoses:  Primary osteoarthritis of left knee  Discharge Diagnoses:  Principal Problem:   Primary osteoarthritis of left knee   Past Medical History:  Diagnosis Date   A-fib (HCC)    Abnormal LFTs    nl liver bx positive antismooth muscle antibodies and neutropenia   Acute gastroenteritis 10/10/2023   Allergy 1957   Penicillin (hives)   Arrhythmia    Arthritis    left knee   Cataract 2020   Cataract surgery 2020   Diplopia 09/30/2000   eval neg   Dysrhythmia    History of kidney stones    Hyperlipidemia    HDL over 100   Hypertension 2018 (?)   Not sure of exact date   Hypothyroidism    Leukopenia    with neg hemevaluation bm bx get wbc diff q 6 months   UTI (lower urinary tract infection)     Surgeries: Procedure(s): ARTHROPLASTY, KNEE, TOTAL on 08/30/2024   Consultants (if any):   Discharged Condition: Improved  Hospital Course: Crystal Odom is an 83 y.o. female who was admitted 08/30/2024 with a diagnosis of Primary osteoarthritis of left knee and went to the operating room on 08/30/2024 and underwent the above named procedures.    She was given perioperative antibiotics:  Anti-infectives (From admission, onward)    Start     Dose/Rate Route Frequency Ordered Stop   08/30/24 1400  ceFAZolin (ANCEF) IVPB 2g/100 mL premix        2 g 200 mL/hr over 30 Minutes Intravenous Every 6 hours 08/30/24 1048 08/30/24 2133   08/30/24 0600  ceFAZolin (ANCEF) IVPB 2g/100 mL premix        2 g 200 mL/hr over 30 Minutes Intravenous On call to O.R. 08/30/24 0532 08/30/24 0731     .  She was given sequential compression devices, early ambulation, and eliquis  for DVT prophylaxis.  She benefited maximally from the hospital stay and there were no complications.    Recent vital signs:   Vitals:   08/31/24 0110 08/31/24 0639  BP: 124/62 130/72  Pulse: 66 66  Resp: 14 16  Temp: 98.9 F (37.2 C) 98.3 F (36.8 C)  SpO2: 96% 97%    Recent laboratory studies:  Lab Results  Component Value Date   HGB 10.9 (L) 08/31/2024   HGB 13.0 08/17/2024   HGB 13.2 03/09/2024   Lab Results  Component Value Date   WBC 8.1 08/31/2024   PLT 228 08/31/2024   Lab Results  Component Value Date   INR 0.91 11/10/2009   Lab Results  Component Value Date   NA 136 08/31/2024   K 4.1 08/31/2024   CL 104 08/31/2024   CO2 25 08/31/2024   BUN 20 08/31/2024   CREATININE 1.06 (H) 08/31/2024   GLUCOSE 110 (H) 08/31/2024    Discharge Medications:   Allergies as of 08/31/2024       Reactions   Penicillins Hives        Medication List     TAKE these medications    acetaminophen  500 MG tablet Commonly known as: TYLENOL  Take 1,000 mg by mouth every 6 (six) hours as needed for moderate pain (pain score 4-6) or mild pain (pain score 1-3). What changed: Another medication with the same name was added. Make sure you understand how and when to take each.   acetaminophen   500 MG tablet Commonly known as: TYLENOL  Take 2 tablets (1,000 mg total) by mouth every 8 (eight) hours as needed. What changed: You were already taking a medication with the same name, and this prescription was added. Make sure you understand how and when to take each.   apixaban  2.5 MG Tabs tablet Commonly known as: ELIQUIS  Take 1 tablet (2.5 mg total) by mouth 2 (two) times daily.   celecoxib  100 MG capsule Commonly known as: CeleBREX  Take 1 capsule (100 mg total) by mouth 2 (two) times daily for 14 days.   cholecalciferol 25 MCG (1000 UNIT) tablet Commonly known as: VITAMIN D3 Take 1,000 Units by mouth daily.   ezetimibe  10 MG tablet Commonly known as: ZETIA  Take 1 tablet (10 mg total) by mouth at bedtime. What changed: when to take this   levothyroxine  88 MCG tablet Commonly known as:  SYNTHROID  TAKE 1 TABLET (88 MCG TOTAL) BY MOUTH IN THE MORNING   lisinopril  5 MG tablet Commonly known as: ZESTRIL  Take 1 tablet (5 mg total) by mouth daily.   methocarbamol  500 MG tablet Commonly known as: ROBAXIN  Take 1 tablet (500 mg total) by mouth every 8 (eight) hours as needed for up to 10 days for muscle spasms.   metoprolol  tartrate 25 MG tablet Commonly known as: LOPRESSOR  Take 1 tablet (25 mg total) by mouth 2 (two) times daily as needed.   omeprazole  40 MG capsule Commonly known as: PRILOSEC Take 1 capsule (40 mg total) by mouth daily for 21 days.   ondansetron  4 MG tablet Commonly known as: Zofran  Take 1 tablet (4 mg total) by mouth every 8 (eight) hours as needed for up to 14 days for nausea or vomiting.   oxyCODONE  5 MG immediate release tablet Commonly known as: Roxicodone  Take 1 tablet (5 mg total) by mouth every 4 (four) hours as needed for up to 7 days for severe pain (pain score 7-10) or moderate pain (pain score 4-6).   polyethylene glycol 17 g packet Commonly known as: MiraLax  Take 17 g by mouth daily.   rosuvastatin  10 MG tablet Commonly known as: CRESTOR  Take 1 tablet (10 mg total) by mouth at bedtime. What changed: when to take this   SIMPLY SALINE NA Place 1 spray into the nose daily as needed.        Diagnostic Studies: DG Knee Left Port Result Date: 08/30/2024 CLINICAL DATA:  Postop. EXAM: PORTABLE LEFT KNEE - 1-2 VIEW COMPARISON:  None Available. FINDINGS: Left knee arthroplasty in expected alignment. No periprosthetic lucency or fracture. There has been patellar resurfacing. Recent postsurgical change includes air and edema in the soft tissues and joint space. IMPRESSION: Left knee arthroplasty without immediate postoperative complication. Electronically Signed   By: Andrea Gasman M.D.   On: 08/30/2024 11:05    Disposition: Discharge disposition: 01-Home or Self Care       Discharge Instructions     Call MD / Call 911   Complete  by: As directed    If you experience chest pain or shortness of breath, CALL 911 and be transported to the hospital emergency room.  If you develope a fever above 101 F, pus (white drainage) or increased drainage or redness at the wound, or calf pain, call your surgeon's office.   Constipation Prevention   Complete by: As directed    Drink plenty of fluids.  Prune juice may be helpful.  You may use a stool softener, such as Colace (over the counter) 100 mg twice  a day.  Use MiraLax (over the counter) for constipation as needed.   Diet - low sodium heart healthy   Complete by: As directed    Increase activity slowly as tolerated   Complete by: As directed    Post-operative opioid taper instructions:   Complete by: As directed    POST-OPERATIVE OPIOID TAPER INSTRUCTIONS: It is important to wean off of your opioid medication as soon as possible. If you do not need pain medication after your surgery it is ok to stop day one. Opioids include: Codeine, Hydrocodone(Norco, Vicodin), Oxycodone(Percocet, oxycontin) and hydromorphone amongst others.  Long term and even short term use of opiods can cause: Increased pain response Dependence Constipation Depression Respiratory depression And more.  Withdrawal symptoms can include Flu like symptoms Nausea, vomiting And more Techniques to manage these symptoms Hydrate well Eat regular healthy meals Stay active Use relaxation techniques(deep breathing, meditating, yoga) Do Not substitute Alcohol to help with tapering If you have been on opioids for less than two weeks and do not have pain than it is ok to stop all together.  Plan to wean off of opioids This plan should start within one week post op of your joint replacement. Maintain the same interval or time between taking each dose and first decrease the dose.  Cut the total daily intake of opioids by one tablet each day Next start to increase the time between doses. The last dose that should  be eliminated is the evening dose.      Walker standard   Complete by: As directed         Follow-up Information     Edna Toribio LABOR, MD. Go on 09/14/2024.   Specialty: Orthopedic Surgery Why: Your appointment is scheduled for 2:45 Contact information: [address] [place] [address] [phone]         [place], [place] Follow up.   Specialty: Skilled Nursing and Assisted Living Facility Why: all therapy will be on site at [facility] information: [address] [place] [address] [phone]                    Discharge Instructions      INSTRUCTIONS AFTER JOINT REPLACEMENT   Remove items at home which could result in a fall. This includes throw rugs or furniture in walking pathways ICE to the affected joint every three hours while awake for 30 minutes at a time, for at least the first 3-5 days, and then as needed for pain and swelling.  Continue to use ice for pain and swelling. You may notice swelling that will progress down to the foot and ankle.  This is normal after surgery.  Elevate your leg when you are not up walking on it.   Continue to use the breathing machine you got in the hospital (incentive spirometer) which will help keep your temperature down.  It is common for your temperature to cycle up and down following surgery, especially at night when you are not up moving around and exerting yourself.  The breathing machine keeps your lungs expanded and your temperature down.  DIET:  As you were doing prior to hospitalization, we recommend a well-balanced diet.  DRESSING / WOUND CARE / SHOWERING:  Keep the surgical dressing until follow up.  The dressing is water proof, so you can shower without any extra covering.  IF THE DRESSING FALLS OFF or the wound gets wet inside, change the dressing with sterile gauze.  Please use good hand washing techniques before changing the dressing.  Do not use any lotions or creams on the  incision until instructed by your surgeon.    ACTIVITY  Increase activity slowly as tolerated, but follow the weight bearing instructions below.   No driving for 6 weeks or until further direction given by your physician.  You cannot drive while taking narcotics.  No lifting or carrying greater than 10 lbs. until further directed by your surgeon. Avoid periods of inactivity such as sitting longer than an hour when not asleep. This helps prevent blood clots.  You may return to work once you are authorized by your doctor.   WEIGHT BEARING: Weight bearing as tolerated with assist device (walker, cane, etc) as directed, use it as long as suggested by your surgeon or therapist, typically at least 4-6 weeks.  EXERCISES  Results after joint replacement surgery are often greatly improved when you follow the exercise, range of motion and muscle strengthening exercises prescribed by your doctor. Safety measures are also important to protect the joint from further injury. Any time any of these exercises cause you to have increased pain or swelling, decrease what you are doing until you are comfortable again and then slowly increase them. If you have problems or questions, call your caregiver or physical therapist for advice.   Rehabilitation is important following a joint replacement. After just a few days of immobilization, the muscles of the leg can become weakened and shrink (atrophy).  These exercises are designed to build up the tone and strength of the thigh and leg muscles and to improve motion. Often times heat used for twenty to thirty minutes before working out will loosen up your tissues and help with improving the range of motion but do not use heat for the first two weeks following surgery (sometimes heat can increase post-operative swelling).   These exercises can be done on a training (exercise) mat, on the floor, on a table or on a bed. Use whatever works the best and is most comfortable for  you.    Use music or television while you are exercising so that the exercises are a pleasant break in your day. This will make your life better with the exercises acting as a break in your routine that you can look forward to.   Perform all exercises about fifteen times, three times per day or as directed.  You should exercise both the operative leg and the other leg as well.  Exercises include:   Quad Sets - Tighten up the muscle on the front of the thigh (Quad) and hold for 5-10 seconds.   Straight Leg Raises - With your knee straight (if you were given a brace, keep it on), lift the leg to 60 degrees, hold for 3 seconds, and slowly lower the leg.  Perform this exercise against resistance later as your leg gets stronger.  Leg Slides: Lying on your back, slowly slide your foot toward your buttocks, bending your knee up off the floor (only go as far as is comfortable). Then slowly slide your foot back down until your leg is flat on the floor again.  Angel Wings: Lying on your back spread your legs to the side as far apart as you can without causing discomfort.  Hamstring Strength:  Lying on your back, push your heel against the floor with your leg straight by tightening up the muscles of your buttocks.  Repeat, but this time bend your knee to a comfortable  angle, and push your heel against the floor.  You may put a pillow under the heel to make it more comfortable if necessary.   A rehabilitation program following joint replacement surgery can speed recovery and prevent re-injury in the future due to weakened muscles. Contact your doctor or a physical therapist for more information on knee rehabilitation.   CONSTIPATION:  Constipation is defined medically as fewer than three stools per week and severe constipation as less than one stool per week.  Even if you have a regular bowel pattern at home, your normal regimen is likely to be disrupted due to multiple reasons following surgery.  Combination of  anesthesia, postoperative narcotics, change in appetite and fluid intake all can affect your bowels.   YOU MUST use at least one of the following options; they are listed in order of increasing strength to get the job done.  They are all available over the counter, and you may need to use some, POSSIBLY even all of these options:    Drink plenty of fluids (prune juice may be helpful) and high fiber foods Colace 100 mg by mouth twice a day  Senokot for constipation as directed and as needed Dulcolax (bisacodyl), take with full glass of water  Miralax (polyethylene glycol) once or twice a day as needed.  If you have tried all these things and are unable to have a bowel movement in the first 3-4 days after surgery call either your surgeon or your primary doctor.    If you experience loose stools or diarrhea, hold the medications until you stool forms back up.  If your symptoms do not get better within 1 week or if they get worse, check with your doctor.  If you experience the worst abdominal pain ever or develop nausea or vomiting, please contact the office immediately for further recommendations for treatment.  ITCHING:  If you experience itching with your medications, try taking only a single pain pill, or even half a pain pill at a time.  You can also use Benadryl over the counter for itching or also to help with sleep.   TED HOSE STOCKINGS:  Use stockings on both legs until for at least 2 weeks or as directed by physician office. They may be removed at night for sleeping.  MEDICATIONS:  See your medication summary on the "After Visit Summary" that nursing will review with you.  You may have some home medications which will be placed on hold until you complete the course of blood thinner medication.  It is important for you to complete the blood thinner medication as prescribed.  Blood clot prevention (DVT Prophylaxis): After surgery you are at an increased risk for a blood clot.  You are to  resume your home Eliquis  2.5mg , to be taken twice daily for a total of 4 weeks from surgery to help reduce your risk of getting a blood clot.  Then continue taking it as prescribed by your regular prescribing provider.  Signs of a pulmonary embolus (blood clot in the lungs) include sudden short of breath, feeling lightheaded or dizzy, chest pain with a deep breath, rapid pulse rapid breathing.  Signs of a blood clot in your arms or legs include new unexplained swelling and cramping, warm, red or darkened skin around the painful area.  Please call the office or 911 right away if these signs or symptoms develop.  PRECAUTIONS:   If you experience chest pain or shortness of breath - call 911 immediately for transfer  to the hospital emergency department.   If you develop a fever greater that 101 F, purulent drainage from wound, increased redness or drainage from wound, foul odor from the wound/dressing, or calf pain - CONTACT YOUR SURGEON.                                                   FOLLOW-UP APPOINTMENTS:  If you do not already have a post-op appointment, please call the office for an appointment to be seen by your surgeon.  Guidelines for how soon to be seen are listed in your "After Visit Summary", but are typically between 2-3 weeks after surgery.  If you have a specialized bandage, you may be told to follow up 1 week after surgery.  OTHER INSTRUCTIONS:  Knee Replacement:  Do not place pillow under knee, focus on keeping the knee straight while resting.  Place foam block, curve side up under heel at all times except when walking.  DO NOT modify, tear, cut, or change the foam block in any way.  POST-OPERATIVE OPIOID TAPER INSTRUCTIONS: It is important to wean off of your opioid medication as soon as possible. If you do not need pain medication after your surgery it is ok to stop day one. Opioids include: Codeine, Hydrocodone(Norco, Vicodin), Oxycodone(Percocet, oxycontin) and hydromorphone amongst  others.  Long term and even short term use of opiods can cause: Increased pain response Dependence Constipation Depression Respiratory depression And more.  Withdrawal symptoms can include Flu like symptoms Nausea, vomiting And more Techniques to manage these symptoms Hydrate well Eat regular healthy meals Stay active Use relaxation techniques(deep breathing, meditating, yoga) Do Not substitute Alcohol to help with tapering If you have been on opioids for less than two weeks and do not have pain than it is ok to stop all together.  Plan to wean off of opioids This plan should start within one week post op of your joint replacement. Maintain the same interval or time between taking each dose and first decrease the dose.  Cut the total daily intake of opioids by one tablet each day Next start to increase the time between doses. The last dose that should be eliminated is the evening dose.   MAKE SURE YOU:  Understand these instructions.  Get help right away if you are not doing well or get worse.    Thank you for letting us  be a part of your medical care team.  It is a privilege we respect greatly.  We hope these instructions will help you stay on track for a fast and full recovery!            Signed: Demond Shallenberger A Azalynn Maxim 08/31/2024, 7:09 AM

## 2024-08-31 NOTE — Plan of Care (Signed)
  Problem: Education: Goal: Knowledge of General Education information will improve Description: Including pain rating scale, medication(s)/side effects and non-pharmacologic comfort measures 08/31/2024 0804 by Alaina Dozier PARAS, RN Outcome: Adequate for Discharge 08/31/2024 0804 by Alaina Dozier PARAS, RN Outcome: Progressing   Problem: Health Behavior/Discharge Planning: Goal: Ability to manage health-related needs will improve 08/31/2024 0804 by Alaina Dozier PARAS, RN Outcome: Adequate for Discharge 08/31/2024 0804 by Alaina Dozier PARAS, RN Outcome: Progressing   Problem: Clinical Measurements: Goal: Ability to maintain clinical measurements within normal limits will improve 08/31/2024 0804 by Alaina Dozier PARAS, RN Outcome: Adequate for Discharge 08/31/2024 0804 by Alaina Dozier PARAS, RN Outcome: Progressing Goal: Will remain free from infection 08/31/2024 0804 by Alaina Dozier PARAS, RN Outcome: Adequate for Discharge 08/31/2024 0804 by Alaina Dozier PARAS, RN Outcome: Progressing Goal: Diagnostic test results will improve Outcome: Adequate for Discharge Goal: Respiratory complications will improve Outcome: Adequate for Discharge Goal: Cardiovascular complication will be avoided Outcome: Adequate for Discharge   Problem: Activity: Goal: Risk for activity intolerance will decrease Outcome: Adequate for Discharge   Problem: Nutrition: Goal: Adequate nutrition will be maintained Outcome: Adequate for Discharge   Problem: Coping: Goal: Level of anxiety will decrease Outcome: Adequate for Discharge   Problem: Elimination: Goal: Will not experience complications related to bowel motility Outcome: Adequate for Discharge Goal: Will not experience complications related to urinary retention Outcome: Adequate for Discharge   Problem: Pain Managment: Goal: General experience of comfort will improve and/or be controlled Outcome: Adequate for Discharge    Problem: Safety: Goal: Ability to remain free from injury will improve Outcome: Adequate for Discharge   Problem: Skin Integrity: Goal: Risk for impaired skin integrity will decrease Outcome: Adequate for Discharge   Problem: Education: Goal: Knowledge of the prescribed therapeutic regimen will improve Outcome: Adequate for Discharge Goal: Individualized Educational Video(s) Outcome: Adequate for Discharge   Problem: Activity: Goal: Ability to avoid complications of mobility impairment will improve Outcome: Adequate for Discharge Goal: Range of joint motion will improve Outcome: Adequate for Discharge   Problem: Clinical Measurements: Goal: Postoperative complications will be avoided or minimized Outcome: Adequate for Discharge   Problem: Pain Management: Goal: Pain level will decrease with appropriate interventions Outcome: Adequate for Discharge   Problem: Skin Integrity: Goal: Will show signs of wound healing Outcome: Adequate for Discharge

## 2024-08-31 NOTE — Plan of Care (Signed)

## 2024-08-31 NOTE — Progress Notes (Signed)
 Physical Therapy Treatment Patient Details Name: Crystal Odom MRN: [id number] DOB: 1940/12/16 Today's Date: 08/31/2024   History of Present Illness 83  yo female S/P  LTKA 08/30/24. PMH: diplopia, UTI,  afib, HTN, arrythmia    PT Comments   The patient has met goals  for safe Dc today.  Ambulated, practiced steps and performed HEP.   If plan is discharge home, recommend the following: A little help with walking and/or transfers;A little help with bathing/dressing/bathroom;Help with stairs or ramp for entrance;Assistance with cooking/housework   Can travel by private Scientist[employer] (medical) walker (2 wheels)    Recommendations for Other Services       Precautions / Restrictions Precautions Precautions: Fall;Knee Restrictions LLE Weight Bearing Per Provider Order: Weight bearing as tolerated     Mobility  Bed Mobility   Bed Mobility: Supine to Sit     Supine to sit: Supervision     General bed mobility comments: manages LLE    Transfers   Equipment used: Rolling walker (2 wheels) Transfers: Sit to/from Stand Sit to Stand: Supervision           General transfer comment: cues for hand and LLE position    Ambulation/Gait Ambulation/Gait assistance: Contact guard assist Gait Distance (Feet): 30 Feet (then 150) Assistive device: Rolling walker (2 wheels) Gait Pattern/deviations: Step-through pattern, Step-to pattern Gait velocity: decr     General Gait Details: cues for sequence   Stairs Stairs: Yes Stairs assistance: Contact guard assist Stair Management: One rail Right Number of Stairs: 2 General stair comments: cues for sequence   Wheelchair Mobility     Tilt Bed    Modified Rankin (Stroke Patients Only)       Balance Overall balance assessment: Mild deficits observed, not formally tested                                          Communication    Cognition Arousal: Alert Behavior  During Therapy: WFL for tasks assessed/performed   PT - Cognitive impairments: No apparent impairments                         Following commands: Intact      Cueing    Exercises Total Joint Exercises Ankle Circles/Pumps: AROM, Both, 10 reps Quad Sets: AROM, Both, 10 reps Heel Slides: AROM, Left, 10 reps Hip ABduction/ADduction: AROM, Left, 10 reps Straight Leg Raises: AROM, Left, 10 reps Long Arc Quad: AROM, Left, 10 reps Goniometric ROM: L knee flex 0-60    General Comments        Pertinent Vitals/Pain Pain Assessment Pain Assessment: 0-10 Pain Score: 3  Pain Location: left knee Pain Descriptors / Indicators: Discomfort Pain Intervention(s): Premedicated before session, Repositioned, Ice applied    Home Living                          Prior Function            PT Goals (current goals can now be found in the care plan section) Progress towards PT goals: Progressing toward goals    Frequency    7X/week      PT Plan      Co-evaluation              AM-PAC  PT 6 Clicks Mobility   Outcome Measure  Help needed turning from your back to your side while in a flat bed without using bedrails?: None Help needed moving from lying on your back to sitting on the side of a flat bed without using bedrails?: None Help needed moving to and from a bed to a chair (including a wheelchair)?: A Little Help needed standing up from a chair using your arms (e.g., wheelchair or bedside chair)?: A Little Help needed to walk in hospital room?: A Little Help needed climbing 3-5 steps with a railing? : A Little 6 Click Score: 20    End of Session Equipment Utilized During Treatment: Gait belt Activity Tolerance: Patient tolerated treatment well Patient left: in chair;with call bell/phone within reach;with nursing/sitter in room Nurse Communication: Mobility status PT Visit Diagnosis: Unsteadiness on feet (R26.81);Muscle weakness (generalized)  (M62.81)     Time: 0820-0900 PT Time Calculation (min) (ACUTE ONLY): 40 min  Charges:    $Gait Training: 8-22 mins $Therapeutic Exercise: 8-22 mins $Self Care/Home Management: 8-22 PT General Charges $$ ACUTE PT VISIT: 1 Visit                     Darice Potters PT Acute Rehabilitation Services Office [phone]     Latorie, Montesano 08/31/2024, 12:41 PM

## 2024-09-15 ENCOUNTER — Other Ambulatory Visit: Payer: Self-pay | Admitting: Internal Medicine

## 2024-09-16 ENCOUNTER — Encounter: Payer: Self-pay | Admitting: Internal Medicine

## 2024-09-17 NOTE — Progress Notes (Signed)
" ° °  09/17/2024  Patient ID: Crystal Odom, female   DOB: [birth year], 83 y.o.   MRN: [id number]  Pharmacy Quality Measure Review  This patient is appearing on a report for being at risk of failing the adherence measure for cholesterol (statin) medications this calendar year.   Medication: Rosuvastatin  10mg  Last fill date: 09/16/24 for 90 day supply  Insurance report was not up to date. No action needed at this time.    Jon VEAR Lindau, PharmD Clinical Pharmacist [phone]   "

## 2024-09-20 ENCOUNTER — Other Ambulatory Visit: Payer: Self-pay | Admitting: Family

## 2024-09-20 MED ORDER — IBANDRONATE SODIUM 150 MG PO TABS: 150.0000 mg | ORAL_TABLET | ORAL | 1 refills | Status: AC

## 2024-10-13 ENCOUNTER — Encounter: Payer: Self-pay | Admitting: Internal Medicine

## 2024-10-13 MED ORDER — LEVOTHYROXINE SODIUM 88 MCG PO TABS: 88.0000 ug | ORAL_TABLET | Freq: Every morning | ORAL | 0 refills | Status: AC

## 2024-10-29 ENCOUNTER — Telehealth: Payer: Self-pay

## 2024-10-29 NOTE — Telephone Encounter (Signed)
 Copied from CRM [id number]. Topic: Appointments - Transfer of Care >> Oct 29, 2024  9:41 AM Berneda FALCON wrote: Pt is requesting to transfer FROM: Dr Apolinar Eastern Pt is requesting to transfer TO: Dr. Luke Shade Reason for requested transfer: Patient moved It is the responsibility of the team the patient would like to transfer to (Dr. Shade) to reach out to the patient if for any reason this transfer is not acceptable.

## 2024-11-02 NOTE — Progress Notes (Signed)
" ° °  11/02/2024  Patient ID: Crystal Odom, female   DOB: 1940/11/12, 84 y.o.   MRN: [id number]  Pharmacy Quality Measure Review  This patient is appearing on a report for being at risk of failing the adherence measure for cholesterol (statin) medications this calendar year.   Medication: Rosuvastatin  Last fill date: 09/17/24 for 90 day supply  Insurance report was not up to date. No action needed at this time.   Jon VEAR Lindau, PharmD Clinical Pharmacist [phone]   "

## 2024-11-04 ENCOUNTER — Encounter: Payer: Self-pay | Admitting: Internal Medicine

## 2024-11-09 ENCOUNTER — Encounter: Payer: PPO | Admitting: Internal Medicine

## 2024-12-01 ENCOUNTER — Ambulatory Visit: Admitting: Pulmonary Disease

## 2024-12-02 ENCOUNTER — Ambulatory Visit: Admitting: Pulmonary Disease

## 2025-02-02 ENCOUNTER — Encounter
